# Patient Record
Sex: Female | Born: 1955 | Race: White | Hispanic: No | Marital: Married | Smoking: Never smoker
Health system: Southern US, Community
[De-identification: ages and names within clinical notes are randomized; demographics above are authoritative.]

## PROBLEM LIST (undated history)

## (undated) DIAGNOSIS — F419 Anxiety disorder, unspecified: Secondary | ICD-10-CM

## (undated) DIAGNOSIS — I10 Essential (primary) hypertension: Secondary | ICD-10-CM

## (undated) DIAGNOSIS — E119 Type 2 diabetes mellitus without complications: Secondary | ICD-10-CM

## (undated) HISTORY — DX: Type 2 diabetes mellitus without complications: E11.9

## (undated) HISTORY — PX: CHOLECYSTECTOMY: SHX55

## (undated) HISTORY — DX: Anxiety disorder, unspecified: F41.9

## (undated) HISTORY — DX: Essential (primary) hypertension: I10

---

## 2004-09-18 ENCOUNTER — Ambulatory Visit: Payer: Self-pay | Admitting: Internal Medicine

## 2004-09-19 ENCOUNTER — Ambulatory Visit: Payer: Self-pay | Admitting: General Surgery

## 2004-09-22 ENCOUNTER — Observation Stay: Payer: Self-pay | Admitting: General Surgery

## 2004-09-22 ENCOUNTER — Other Ambulatory Visit: Payer: Self-pay

## 2011-04-19 ENCOUNTER — Ambulatory Visit: Payer: Self-pay | Admitting: Internal Medicine

## 2013-12-07 LAB — BASIC METABOLIC PANEL
BUN: 10 mg/dL (ref 4–21)
Creatinine: 0.6 mg/dL (ref 0.5–1.1)
GLUCOSE: 243 mg/dL
Potassium: 4.4 mmol/L (ref 3.4–5.3)
Sodium: 138 mmol/L (ref 137–147)

## 2013-12-07 LAB — LIPID PANEL
Cholesterol: 193 mg/dL (ref 0–200)
HDL: 37 mg/dL (ref 35–70)
LDL Cholesterol: 122 mg/dL
LDl/HDL Ratio: 3.3
Triglycerides: 169 mg/dL — AB (ref 40–160)

## 2013-12-07 LAB — HEPATIC FUNCTION PANEL
ALT: 61 U/L — AB (ref 7–35)
AST: 91 U/L — AB (ref 13–35)
Alkaline Phosphatase: 91 U/L (ref 25–125)
Bilirubin, Total: 0.5 mg/dL

## 2013-12-07 LAB — CBC AND DIFFERENTIAL
HCT: 42 % (ref 36–46)
Hemoglobin: 14.1 g/dL (ref 12.0–16.0)
Neutrophils Absolute: 6 /uL
Platelets: 217 10*3/uL (ref 150–399)
WBC: 9.8 10^3/mL

## 2013-12-07 LAB — TSH: TSH: 3.97 u[IU]/mL (ref 0.41–5.90)

## 2014-03-12 ENCOUNTER — Ambulatory Visit: Payer: Self-pay | Admitting: Family Medicine

## 2014-06-25 LAB — HEMOGLOBIN A1C: Hemoglobin A1C: 5.9

## 2014-11-05 ENCOUNTER — Other Ambulatory Visit: Payer: Self-pay | Admitting: Family Medicine

## 2014-11-05 DIAGNOSIS — F419 Anxiety disorder, unspecified: Secondary | ICD-10-CM

## 2014-11-08 DIAGNOSIS — F411 Generalized anxiety disorder: Secondary | ICD-10-CM | POA: Insufficient documentation

## 2014-11-08 DIAGNOSIS — F419 Anxiety disorder, unspecified: Secondary | ICD-10-CM | POA: Insufficient documentation

## 2014-12-26 ENCOUNTER — Other Ambulatory Visit: Payer: Self-pay | Admitting: Family Medicine

## 2014-12-28 ENCOUNTER — Other Ambulatory Visit: Payer: Self-pay | Admitting: Family Medicine

## 2014-12-29 NOTE — Telephone Encounter (Signed)
Last ov was on 06/25/2014  Last time the xanax prescription was refilled was on 09/04/2014  Thanks,

## 2014-12-29 NOTE — Telephone Encounter (Signed)
Ok to refill rx. Thanks.

## 2015-01-19 ENCOUNTER — Encounter: Payer: Self-pay | Admitting: Family Medicine

## 2015-04-26 ENCOUNTER — Other Ambulatory Visit: Payer: Self-pay | Admitting: Family Medicine

## 2015-04-26 DIAGNOSIS — I152 Hypertension secondary to endocrine disorders: Secondary | ICD-10-CM | POA: Insufficient documentation

## 2015-04-26 DIAGNOSIS — I10 Essential (primary) hypertension: Secondary | ICD-10-CM

## 2015-04-26 NOTE — Telephone Encounter (Signed)
Last OV 06/2014  Thanks,   -Davita Sublett  

## 2015-06-30 ENCOUNTER — Other Ambulatory Visit: Payer: Self-pay | Admitting: Family Medicine

## 2015-06-30 DIAGNOSIS — I10 Essential (primary) hypertension: Secondary | ICD-10-CM

## 2015-06-30 NOTE — Telephone Encounter (Signed)
Last OV 06/2014 

## 2015-07-23 ENCOUNTER — Other Ambulatory Visit: Payer: Self-pay | Admitting: Family Medicine

## 2015-07-25 NOTE — Telephone Encounter (Signed)
06/25/14 last ov

## 2015-09-27 ENCOUNTER — Other Ambulatory Visit: Payer: Self-pay | Admitting: Family Medicine

## 2015-09-27 DIAGNOSIS — I10 Essential (primary) hypertension: Secondary | ICD-10-CM

## 2015-10-05 DIAGNOSIS — R748 Abnormal levels of other serum enzymes: Secondary | ICD-10-CM | POA: Insufficient documentation

## 2015-10-05 DIAGNOSIS — IMO0002 Reserved for concepts with insufficient information to code with codable children: Secondary | ICD-10-CM | POA: Insufficient documentation

## 2015-10-05 DIAGNOSIS — E119 Type 2 diabetes mellitus without complications: Secondary | ICD-10-CM | POA: Insufficient documentation

## 2015-10-05 DIAGNOSIS — F329 Major depressive disorder, single episode, unspecified: Secondary | ICD-10-CM | POA: Insufficient documentation

## 2015-10-05 DIAGNOSIS — E1165 Type 2 diabetes mellitus with hyperglycemia: Secondary | ICD-10-CM | POA: Insufficient documentation

## 2015-11-04 ENCOUNTER — Encounter: Payer: Self-pay | Admitting: Physician Assistant

## 2015-11-04 ENCOUNTER — Ambulatory Visit (INDEPENDENT_AMBULATORY_CARE_PROVIDER_SITE_OTHER): Payer: Managed Care, Other (non HMO) | Admitting: Physician Assistant

## 2015-11-04 VITALS — BP 160/102 | HR 86 | Temp 98.3°F | Resp 16 | Ht 60.0 in | Wt 219.4 lb

## 2015-11-04 DIAGNOSIS — IMO0002 Reserved for concepts with insufficient information to code with codable children: Secondary | ICD-10-CM

## 2015-11-04 DIAGNOSIS — F32A Depression, unspecified: Secondary | ICD-10-CM

## 2015-11-04 DIAGNOSIS — R748 Abnormal levels of other serum enzymes: Secondary | ICD-10-CM

## 2015-11-04 DIAGNOSIS — F329 Major depressive disorder, single episode, unspecified: Secondary | ICD-10-CM

## 2015-11-04 DIAGNOSIS — F419 Anxiety disorder, unspecified: Secondary | ICD-10-CM | POA: Diagnosis not present

## 2015-11-04 DIAGNOSIS — E668 Other obesity: Secondary | ICD-10-CM

## 2015-11-04 DIAGNOSIS — I10 Essential (primary) hypertension: Secondary | ICD-10-CM | POA: Diagnosis not present

## 2015-11-04 DIAGNOSIS — E119 Type 2 diabetes mellitus without complications: Secondary | ICD-10-CM

## 2015-11-04 DIAGNOSIS — E782 Mixed hyperlipidemia: Secondary | ICD-10-CM

## 2015-11-04 MED ORDER — ALPRAZOLAM 0.5 MG PO TABS: ORAL_TABLET | ORAL | Status: DC

## 2015-11-04 MED ORDER — HYDROCHLOROTHIAZIDE 12.5 MG PO CAPS: 12.5000 mg | ORAL_CAPSULE | Freq: Every day | ORAL | Status: DC

## 2015-11-04 NOTE — Progress Notes (Signed)
Patient: Dana ArrowCarol E Tantillo, Female    DOB: 05/22/1955, 60 y.o.   MRN: [id number] Visit Date: 11/04/2015  Today's Provider: Margaretann LovelessJennifer M Burnette, PA-C   Chief Complaint  Patient presents with  . Annual Exam   Subjective:    Annual physical exam Dana Wallace is a 60 y.o. female who presents today for follow up on chronic issues she has not been seen for in over a year including: HTN, T2DM, depression and anxiety. She feels fairly well. She reports exercising none. She reports she is sleeping poorly. 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 She had focused on taking care of the dog for a year following her diagnosis until the dog deteriorated enough to have to be put down. She reports she used to get up all hours of the night for lil' muffin and is still finsing herself waking up to take care of her. They had the dog for 14 years. She does state that after the diagnosis of cancer for lil' muffin she really quit taking care of herself and focused on work and taking care of the dog. She felt she had been doing ok before that time. She had even lost about 20 pounds before all this started.   She has been taking her metoprolol 25mg  BID. She has not been taking metformin and has not taken that for a while. She is taking her xanax prn, mostly to help her sleep of recent. She is out of the xanax currently.   Patient is teary in office she reports that has a lot of stress and feeling anxious/nervous. She feels upset about her weight gain.  Mammogram: Pt reports that she had it done Feb/March of this year and was normal. She had it [facility] at [facility]. -----------------------------------------------------------------   Review of Systems  Constitutional: Positive for fatigue.  HENT: Negative.   Eyes: Negative.   Respiratory: Negative.   Cardiovascular: Negative.     Gastrointestinal: Negative.   Endocrine: Negative.   Genitourinary: Negative.   Musculoskeletal: Negative.   Skin: Negative.   Allergic/Immunologic: Negative.   Neurological: Negative.   Hematological: Negative.   Psychiatric/Behavioral: Positive for sleep disturbance and dysphoric mood. Negative for suicidal ideas, self-injury, decreased concentration and agitation. The patient is nervous/anxious.     Social History      She  reports that she has never smoked. She does not have any smokeless tobacco history on file. She reports that she does not drink alcohol or use illicit drugs.       Social History   Social History  . Marital Status: Married    Spouse Name: N/A  . Number of Children: N/A  . Years of Education: N/A   Social History Main Topics  . Smoking status: Never Smoker   . Smokeless tobacco: None  . Alcohol Use: No  . Drug Use: No  . Sexual Activity: Not Asked   Other Topics Concern  . None   Social History Narrative    History reviewed. No pertinent past medical history.   Patient Active Problem List   Diagnosis Date Noted  . Adult BMI 30+ 10/05/2015  . Clinical depression 10/05/2015  . Abnormal liver enzymes 10/05/2015  . Diabetes mellitus, type 2 (HCC) 10/05/2015  . Essential (primary) hypertension 04/26/2015  . Anxiety 11/08/2014    Past Surgical History  Procedure Laterality Date  . Cholecystectomy    . Cesarean section      Family History        Family Status  Relation Status Death Age  . Mother Deceased   . Sister Deceased   . Brother Alive         Her family history includes COPD in her mother; Hodgkin's lymphoma in her sister; Multiple sclerosis in her mother.    No Known Allergies  Current Meds  Medication Sig  . ALPRAZolam (XANAX) 0.5 MG tablet TAKE ONE-HALF TO ONE TABLET BY MOUTH TWICE DAILY AS NEEDED  . aspirin 81 MG tablet Take by mouth.  Marland Kitchen ibuprofen (ADVIL,MOTRIN) 200 MG tablet   . metoprolol tartrate (LOPRESSOR) 25 MG  tablet TAKE 1 TABLET(25 MG) BY MOUTH TWICE DAILY  . Multiple Vitamin tablet   . Omega-3 Fatty Acids (FISH OIL) 1200 MG CPDR Take by mouth daily.    Patient Care Team: Margaretann Loveless, PA-C as PCP - General (Family Medicine)     Objective:   Vitals: BP 160/102 mmHg  Pulse 86  Temp(Src) 98.3 F (36.8 C) (Oral)  Resp 16  Ht 5' (1.524 m)  Wt 219 lb 6.4 oz (99.519 kg)  BMI 42.85 kg/m2  LMP    Physical Exam  Constitutional: She is oriented to person, place, and time. She appears well-developed and well-nourished. No distress.  HENT:  Head: Normocephalic and atraumatic.  Right Ear: External ear normal.  Left Ear: External ear normal.  Nose: Nose normal.  Mouth/Throat: Oropharynx is clear and moist. No oropharyngeal exudate.  Eyes: Conjunctivae and EOM are normal. Pupils are equal, round, and reactive to light. Right eye exhibits no discharge. Left eye exhibits no discharge. No scleral icterus.  Neck: Normal range of motion. Neck supple. No JVD present. No tracheal deviation present. No thyromegaly present.  Cardiovascular: Normal rate, regular rhythm, normal heart sounds and intact distal pulses.  Exam reveals no gallop and no friction rub.   No murmur heard. Pulmonary/Chest: Effort normal and breath sounds normal. No respiratory distress. She has no wheezes. She has no rales. She exhibits no tenderness.  Abdominal: Soft. Bowel sounds are normal. She exhibits no distension and no mass. There is no tenderness. There is no rebound and no guarding.  Musculoskeletal: Normal range of motion. She exhibits no edema or tenderness.  Lymphadenopathy:    She has no cervical adenopathy.  Neurological: She is alert and oriented to person, place, and time.  Skin: Skin is warm and dry. No rash noted. She is not diaphoretic.  Psychiatric: Her behavior is normal. Judgment and thought content normal. She exhibits a depressed mood.  Tearful today  Vitals reviewed.    Depression Screen PHQ  2/9 Scores 11/04/2015  PHQ - 2 Score 1      Assessment & Plan:    1. Essential (primary) hypertension Will check labs as below and add HCTZ back to metoprolol for BP. I will see her back in 4 weeks to recehck BP.  - CBC with Differential/Platelet - Comprehensive metabolic panel - Lipid panel - TSH - hydrochlorothiazide (MICROZIDE) 12.5 MG capsule; Take 1 capsule (12.5 mg total) by mouth daily.  Dispense: 30 capsule; Refill: 0  2. Type 2 diabetes mellitus without complication, without long-term current use of insulin (HCC) Will check labs and f/u pending results. May consider adding metformin back if needed.  - Hemoglobin A1c - Lipid panel  3. Adult BMI 30+ Will check labs and f/u pending results. If  BP stabilizing in 4 weeks may consider discussing weight loss. - Lipid panel  4. Abnormal liver enzymes H/O this. Most likely secondary to fatty liver. Will check labs and f/u pending lab results. - Comprehensive metabolic panel  5. Clinical depression Xanax refilled as below. Will check labs and f/u pending results. Will recheck in 4 weeks. - TSH - ALPRAZolam (XANAX) 0.5 MG tablet; TAKE ONE-HALF TO ONE TABLET BY MOUTH TWICE DAILY AS NEEDED  Dispense: 45 tablet; Refill: 0  6. Acute anxiety Xanax refilled. Will recheck in 4 weeks. - ALPRAZolam (XANAX) 0.5 MG tablet; TAKE ONE-HALF TO ONE TABLET BY MOUTH TWICE DAILY AS NEEDED  Dispense: 45 tablet; Refill: 0  --------------------------------------------------------------------    Margaretann Loveless, PA-C  [facility]

## 2015-11-04 NOTE — Patient Instructions (Signed)
Hypertension Hypertension, commonly called high blood pressure, is when the force of blood pumping through your arteries is too strong. Your arteries are the blood vessels that carry blood from your heart throughout your body. A blood pressure reading consists of a higher number over a lower number, such as 110/72. The higher number (systolic) is the pressure inside your arteries when your heart pumps. The lower number (diastolic) is the pressure inside your arteries when your heart relaxes. Ideally you want your blood pressure below 120/80. Hypertension forces your heart to work harder to pump blood. Your arteries may become narrow or stiff. Having untreated or uncontrolled hypertension can cause heart attack, stroke, kidney disease, and other problems. RISK FACTORS Some risk factors for high blood pressure are controllable. Others are not.  Risk factors you cannot control include:   Race. You may be at higher risk if you are African American.  Age. Risk increases with age.  Gender. Men are at higher risk than women before age 45 years. After age 65, women are at higher risk than men. Risk factors you can control include:  Not getting enough exercise or physical activity.  Being overweight.  Getting too much fat, sugar, calories, or salt in your diet.  Drinking too much alcohol. SIGNS AND SYMPTOMS Hypertension does not usually cause signs or symptoms. Extremely high blood pressure (hypertensive crisis) may cause headache, anxiety, shortness of breath, and nosebleed. DIAGNOSIS To check if you have hypertension, your health care provider will measure your blood pressure while you are seated, with your arm held at the level of your heart. It should be measured at least twice using the same arm. Certain conditions can cause a difference in blood pressure between your right and left arms. A blood pressure reading that is higher than normal on one occasion does not mean that you need treatment. If  it is not clear whether you have high blood pressure, you may be asked to return on a different day to have your blood pressure checked again. Or, you may be asked to monitor your blood pressure at home for 1 or more weeks. TREATMENT Treating high blood pressure includes making lifestyle changes and possibly taking medicine. Living a healthy lifestyle can help lower high blood pressure. You may need to change some of your habits. Lifestyle changes may include:  Following the DASH diet. This diet is high in fruits, vegetables, and whole grains. It is low in salt, red meat, and added sugars.  Keep your sodium intake below 2,300 mg per day.  Getting at least 30-45 minutes of aerobic exercise at least 4 times per week.  Losing weight if necessary.  Not smoking.  Limiting alcoholic beverages.  Learning ways to reduce stress. Your health care provider may prescribe medicine if lifestyle changes are not enough to get your blood pressure under control, and if one of the following is true:  You are 18-59 years of age and your systolic blood pressure is above 140.  You are 60 years of age or older, and your systolic blood pressure is above 150.  Your diastolic blood pressure is above 90.  You have diabetes, and your systolic blood pressure is over 140 or your diastolic blood pressure is over 90.  You have kidney disease and your blood pressure is above 140/90.  You have heart disease and your blood pressure is above 140/90. Your personal target blood pressure may vary depending on your medical conditions, your age, and other factors. HOME CARE INSTRUCTIONS    Have your blood pressure rechecked as directed by your health care provider.   Take medicines only as directed by your health care provider. Follow the directions carefully. Blood pressure medicines must be taken as prescribed. The medicine does not work as well when you skip doses. Skipping doses also puts you at risk for  problems.  Do not smoke.   Monitor your blood pressure at home as directed by your health care provider. SEEK MEDICAL CARE IF:   You think you are having a reaction to medicines taken.  You have recurrent headaches or feel dizzy.  You have swelling in your ankles.  You have trouble with your vision. SEEK IMMEDIATE MEDICAL CARE IF:  You develop a severe headache or confusion.  You have unusual weakness, numbness, or feel faint.  You have severe chest or abdominal pain.  You vomit repeatedly.  You have trouble breathing. MAKE SURE YOU:   Understand these instructions.  Will watch your condition.  Will get help right away if you are not doing well or get worse.   This information is not intended to replace advice given to you by your health care provider. Make sure you discuss any questions you have with your health care provider.   Document Released: 05/07/2005 Document Revised: 09/21/2014 Document Reviewed: 02/27/2013 Elsevier Interactive Patient Education 2016 [employer]. Complicated Grieving Grief is a normal response to the death of someone close to you. Feelings of fear, anger, and guilt can affect almost everyone who loses a loved one. It is also common to have symptoms of depression while you are grieving. These include problems with sleep, loss of appetite, and lack of energy. They may last for weeks or months after a loss. Complicated grief is different from normal grief or depression. Normal grieving involves sadness and feelings of loss, but these feelings are not constant. Complicated grief is a constant and severe type of grief. It interferes with your ability to function normally. It may last for several months to a year or longer. Complicated grief may require treatment from a mental health care provider. CAUSES  It is not known why some people continue to struggle with grief and others do not. You may be at higher risk for complicated grief if:  The death  of your loved one was sudden or unexpected.  The death of your loved one was due to a violent event.  Your loved one committed suicide.  Your loved one was a child or a young person.  You were very close to or dependent on the loved one.  You have a history of depression. SIGNS AND SYMPTOMS Signs and symptoms of complicated grief may include:  Feeling disbelief or numbness.  Being unable to enjoy good memories of your loved one.  Needing to avoid anything that reminds you of your loved one.  Being unable to stop thinking about the death.  Feeling intense anger or guilt.  Feeling alone and hopeless.  Feeling that your life is meaningless and empty.  Losing the desire to live. DIAGNOSIS Your health care provider may diagnose complicated grief if:  You have constant symptoms of grief for 6-12 months or longer.  Your symptoms are interfering with your ability to live your life. Your health care provider may want you to see a mental health care provider. Many symptoms of depression are similar to the symptoms of complicated grief. It is important to be evaluated for complicated grief along with other mental health conditions. TREATMENT  Talk therapy  with a mental health provider is the most common treatment for complicated grief. During therapy, you will learn healthy ways to cope with the loss of your loved one. In some cases, your mental health care provider may also recommend antidepressant medicines. HOME CARE INSTRUCTIONS  Take care of yourself.  Eat regular meals and maintain a healthy diet. Eat plenty of fruits, vegetables, and whole grains.  Try to get some exercise each day.  Keep regular hours for sleep. Try to get at least 8 hours of sleep each night.  Do not use drugs or alcohol to ease your symptoms.  Take medicines only as directed by your health care provider.  Spend time with friends and loved ones.  Consider joining a grief (bereavement) support group  to help you deal with your loss.  Keep all follow-up visits as directed by your health care provider. This is important. SEEK MEDICAL CARE IF:  Your symptoms keep you from functioning normally.  Your symptoms do not get better with treatment. SEEK IMMEDIATE MEDICAL CARE IF:  You have serious thoughts of hurting yourself or someone else.  You have suicidal feelings.   This information is not intended to replace advice given to you by your health care provider. Make sure you discuss any questions you have with your health care provider.   Document Released: 05/07/2005 Document Revised: 01/26/2015 Document Reviewed: 10/15/2013 Elsevier Interactive Patient Education [employer] Elsevier Inc.

## 2015-11-05 LAB — COMPREHENSIVE METABOLIC PANEL
ALK PHOS: 73 IU/L (ref 39–117)
ALT: 30 IU/L (ref 0–32)
AST: 27 IU/L (ref 0–40)
Albumin/Globulin Ratio: 1.4 (ref 1.2–2.2)
Albumin: 4.4 g/dL (ref 3.5–5.5)
BILIRUBIN TOTAL: 0.4 mg/dL (ref 0.0–1.2)
BUN / CREAT RATIO: 21 (ref 9–23)
BUN: 12 mg/dL (ref 6–24)
CO2: 28 mmol/L (ref 18–29)
CREATININE: 0.58 mg/dL (ref 0.57–1.00)
Calcium: 9.9 mg/dL (ref 8.7–10.2)
Chloride: 97 mmol/L (ref 96–106)
GFR calc Af Amer: 118 mL/min/{1.73_m2} (ref >59)
GFR calc non Af Amer: 103 mL/min/{1.73_m2} (ref >59)
GLUCOSE: 179 mg/dL — AB (ref 65–99)
Globulin, Total: 3.2 g/dL (ref 1.5–4.5)
POTASSIUM: 4.2 mmol/L (ref 3.5–5.2)
SODIUM: 141 mmol/L (ref 134–144)
Total Protein: 7.6 g/dL (ref 6.0–8.5)

## 2015-11-05 LAB — CBC WITH DIFFERENTIAL/PLATELET
BASOS ABS: 0 10*3/uL (ref 0.0–0.2)
Basos: 0 %
EOS (ABSOLUTE): 0.1 10*3/uL (ref 0.0–0.4)
Eos: 1 %
HEMOGLOBIN: 14.2 g/dL (ref 11.1–15.9)
Hematocrit: 40.9 % (ref 34.0–46.6)
IMMATURE GRANS (ABS): 0 10*3/uL (ref 0.0–0.1)
Immature Granulocytes: 0 %
LYMPHS: 25 %
Lymphocytes Absolute: 2.5 10*3/uL (ref 0.7–3.1)
MCH: 31.4 pg (ref 26.6–33.0)
MCHC: 34.7 g/dL (ref 31.5–35.7)
MCV: 91 fL (ref 79–97)
MONOCYTES: 6 %
Monocytes Absolute: 0.6 10*3/uL (ref 0.1–0.9)
NEUTROS ABS: 6.5 10*3/uL (ref 1.4–7.0)
Neutrophils: 68 %
PLATELETS: 262 10*3/uL (ref 150–379)
RBC: 4.52 x10E6/uL (ref 3.77–5.28)
RDW: 13.9 % (ref 12.3–15.4)
WBC: 9.8 10*3/uL (ref 3.4–10.8)

## 2015-11-05 LAB — LIPID PANEL
CHOL/HDL RATIO: 4.5 ratio — AB (ref 0.0–4.4)
Cholesterol, Total: 216 mg/dL — ABNORMAL HIGH (ref 100–199)
HDL: 48 mg/dL (ref >39)
LDL CALC: 134 mg/dL — AB (ref 0–99)
Triglycerides: 171 mg/dL — ABNORMAL HIGH (ref 0–149)
VLDL CHOLESTEROL CAL: 34 mg/dL (ref 5–40)

## 2015-11-05 LAB — TSH: TSH: 2.66 u[IU]/mL (ref 0.450–4.500)

## 2015-11-05 LAB — HEMOGLOBIN A1C
Est. average glucose Bld gHb Est-mCnc: 183 mg/dL
Hgb A1c MFr Bld: 8 % — ABNORMAL HIGH (ref 4.8–5.6)

## 2015-11-07 ENCOUNTER — Telehealth: Payer: Self-pay

## 2015-11-07 DIAGNOSIS — E782 Mixed hyperlipidemia: Secondary | ICD-10-CM | POA: Insufficient documentation

## 2015-11-07 MED ORDER — METFORMIN HCL 500 MG PO TABS: 500.0000 mg | ORAL_TABLET | Freq: Two times a day (BID) | ORAL | Status: DC

## 2015-11-07 MED ORDER — SIMVASTATIN 10 MG PO TABS: 10.0000 mg | ORAL_TABLET | Freq: Every day | ORAL | Status: DC

## 2015-11-07 NOTE — Addendum Note (Signed)
Addended by: Margaretann LovelessBURNETTE, JENNIFER M on: 11/07/2015 08:40 AM   Modules accepted: Orders, SmartSet

## 2015-11-07 NOTE — Telephone Encounter (Signed)
-----   Message from Margaretann LovelessJennifer M Burnette, PA-C sent at 11/07/2015  8:38 AM EDT ----- All labs are within normal limits and stable with exception of cholesterol and HgBA1c. HgBA1c is elevated to 8.0. We need to start metformin back. I will send this in for you. Also because of your T2DM and HTN with elevated cholesterol your 10-yr atherosclerotic cardiovascular disease (heart attack, stroke, etc) is quite elevated at 15.04% chance of an event in the next 10 years. Because of this I am going to add a cholesterol-lowering medication as well. We will need to check HgBA1c in 3 months. This can be done here in the office. We will then check other labs in 6 months. Thanks! -JB

## 2015-11-07 NOTE — Telephone Encounter (Signed)
Call Cell phone number and disconnected and call job and person that answered say to call her at her cell phone.  Thanks,  -Thurma Priego

## 2015-11-08 NOTE — Telephone Encounter (Signed)
LMTCB Re: Benna DunksCarol Siglin since her cell/home number is disconnected.  Thanks,  -Annalee Meyerhoff

## 2015-11-15 NOTE — Telephone Encounter (Signed)
Called again and number is disconnected. On previous visit called the emergency contact and asked if Patient can return call and have not heard from patient.  Thanks,  -Beronica Lansdale

## 2015-11-19 NOTE — Telephone Encounter (Signed)
Letter was sent home. 11/19/15  Thanks,  -Joseline

## 2015-11-28 ENCOUNTER — Other Ambulatory Visit: Payer: Self-pay | Admitting: Physician Assistant

## 2015-12-05 ENCOUNTER — Encounter: Payer: Self-pay | Admitting: Physician Assistant

## 2015-12-05 ENCOUNTER — Ambulatory Visit (INDEPENDENT_AMBULATORY_CARE_PROVIDER_SITE_OTHER): Payer: Managed Care, Other (non HMO) | Admitting: Physician Assistant

## 2015-12-05 VITALS — BP 132/80 | HR 96 | Temp 98.4°F | Resp 16 | Wt 217.4 lb

## 2015-12-05 DIAGNOSIS — E119 Type 2 diabetes mellitus without complications: Secondary | ICD-10-CM

## 2015-12-05 DIAGNOSIS — E782 Mixed hyperlipidemia: Secondary | ICD-10-CM

## 2015-12-05 DIAGNOSIS — F32A Depression, unspecified: Secondary | ICD-10-CM

## 2015-12-05 DIAGNOSIS — F329 Major depressive disorder, single episode, unspecified: Secondary | ICD-10-CM | POA: Diagnosis not present

## 2015-12-05 DIAGNOSIS — I10 Essential (primary) hypertension: Secondary | ICD-10-CM

## 2015-12-05 NOTE — Patient Instructions (Signed)

## 2015-12-05 NOTE — Progress Notes (Signed)
Patient: Dana Wallace Female    DOB: 1955-09-28   60 y.o.   MRN: [id number] Visit Date: 12/05/2015  Today's Provider: Margaretann Loveless, PA-C   No chief complaint on file.  Subjective:    HPI Blood Pressure: Patient is here to follow-up. Patient's blood pressure in the last office visit was 160/102 and today her blood pressure is 132/80.   Clinical Depression: Patient is here to follow-up.Xanax was refilled 4 weeks ago. She reports she still trying to cope, some days she feels good and some days she feels sad.  Labs: We will follow-up on labs since we were not able to reach patient by phone. Patient's Hemoglobin A1C 8.0. Patient reports that she saw the reports on mychart and the pharmacy notified her about the Metformin and Zocor and that she has been taking them.    No Known Allergies Current Meds  Medication Sig  . ALPRAZolam (XANAX) 0.5 MG tablet TAKE ONE-HALF TO ONE TABLET BY MOUTH TWICE DAILY AS NEEDED  . aspirin 81 MG tablet Take by mouth.  . hydrochlorothiazide (MICROZIDE) 12.5 MG capsule TAKE ONE CAPSULE BY MOUTH DAILY  . ibuprofen (ADVIL,MOTRIN) 200 MG tablet   . metFORMIN (GLUCOPHAGE) 500 MG tablet Take 1 tablet (500 mg total) by mouth 2 (two) times daily with a meal.  . metoprolol tartrate (LOPRESSOR) 25 MG tablet TAKE 1 TABLET(25 MG) BY MOUTH TWICE DAILY  . Multiple Vitamin tablet   . Omega-3 Fatty Acids (FISH OIL) 1200 MG CPDR Take by mouth daily.  . simvastatin (ZOCOR) 10 MG tablet Take 1 tablet (10 mg total) by mouth at bedtime.    Review of Systems  Constitutional: Negative for fatigue.  Eyes: Negative for visual disturbance.  Respiratory: Negative.   Cardiovascular: Negative for chest pain, palpitations and leg swelling.  Gastrointestinal: Negative.   Genitourinary: Positive for frequency (feels is from the hydrochlorothiazide).  Neurological: Negative for dizziness, light-headedness and headaches.  Psychiatric/Behavioral: Positive for dysphoric  mood. Negative for sleep disturbance. The patient is not nervous/anxious.     Social History  Substance Use Topics  . Smoking status: Never Smoker   . Smokeless tobacco: Not on file  . Alcohol Use: No   Objective:   BP 132/80 mmHg  Pulse 96  Temp(Src) 98.4 F (36.9 C) (Oral)  Resp 16  Wt 217 lb 6.4 oz (98.612 kg)  Physical Exam  Constitutional: She appears well-developed and well-nourished. No distress.  Neck: Normal range of motion. Neck supple.  Cardiovascular: Normal rate, regular rhythm and normal heart sounds.  Exam reveals no gallop and no friction rub.   No murmur heard. Pulmonary/Chest: Effort normal and breath sounds normal. No respiratory distress. She has no wheezes. She has no rales.  Musculoskeletal: She exhibits no edema.  Skin: She is not diaphoretic.  Psychiatric: Her behavior is normal. Judgment and thought content normal. She exhibits a depressed mood.  Still a little tearful when we discuss lil' muffin (her dog of 28yrs that passed 5 weeks ago)  Vitals reviewed.     Assessment & Plan:     1. Essential (primary) hypertension Improved. Continue current medical treatment plan. I will f/u in 2 months to recheck and make sure stable.  2. Type 2 diabetes mellitus without complication, without long-term current use of insulin (HCC) Currently taking metformin. Will recheck HgBA1c in 2 months.  3. Clinical depression She is doing ok. Has good days and bad days. Will recheck in 2 months.  4.  Hypercholesterolemia with hypertriglyceridemia Now on Zocor. Will recheck in 6 months.       Margaretann LovelessJennifer M Burnette, PA-C  [facility]

## 2015-12-25 ENCOUNTER — Other Ambulatory Visit: Payer: Self-pay | Admitting: Physician Assistant

## 2015-12-25 DIAGNOSIS — F419 Anxiety disorder, unspecified: Secondary | ICD-10-CM

## 2015-12-25 DIAGNOSIS — F329 Major depressive disorder, single episode, unspecified: Secondary | ICD-10-CM

## 2015-12-25 DIAGNOSIS — F32A Depression, unspecified: Secondary | ICD-10-CM

## 2015-12-26 NOTE — Telephone Encounter (Signed)
Called in to pharmacy. Althia Egolf Drozdowski, CMA  

## 2016-01-27 ENCOUNTER — Other Ambulatory Visit: Payer: Self-pay | Admitting: Physician Assistant

## 2016-01-27 DIAGNOSIS — E119 Type 2 diabetes mellitus without complications: Secondary | ICD-10-CM

## 2016-01-27 MED ORDER — GLUCOSE BLOOD VI STRP: ORAL_STRIP | 12 refills | Status: DC

## 2016-02-06 ENCOUNTER — Encounter: Payer: Self-pay | Admitting: Physician Assistant

## 2016-02-06 ENCOUNTER — Ambulatory Visit (INDEPENDENT_AMBULATORY_CARE_PROVIDER_SITE_OTHER): Payer: Managed Care, Other (non HMO) | Admitting: Physician Assistant

## 2016-02-06 VITALS — BP 112/72 | HR 84 | Temp 99.1°F | Resp 16 | Ht 60.0 in | Wt 218.0 lb

## 2016-02-06 DIAGNOSIS — Z713 Dietary counseling and surveillance: Secondary | ICD-10-CM

## 2016-02-06 DIAGNOSIS — Z6841 Body Mass Index (BMI) 40.0 and over, adult: Secondary | ICD-10-CM | POA: Diagnosis not present

## 2016-02-06 DIAGNOSIS — E782 Mixed hyperlipidemia: Secondary | ICD-10-CM | POA: Diagnosis not present

## 2016-02-06 DIAGNOSIS — I10 Essential (primary) hypertension: Secondary | ICD-10-CM | POA: Diagnosis not present

## 2016-02-06 DIAGNOSIS — E119 Type 2 diabetes mellitus without complications: Secondary | ICD-10-CM

## 2016-02-06 LAB — POCT GLYCOSYLATED HEMOGLOBIN (HGB A1C)
ESTIMATED AVERAGE GLUCOSE: 189
HEMOGLOBIN A1C: 8.2

## 2016-02-06 MED ORDER — PHENTERMINE HCL 37.5 MG PO TABS: 37.5000 mg | ORAL_TABLET | Freq: Every day | ORAL | 0 refills | Status: DC

## 2016-02-06 NOTE — Patient Instructions (Signed)

## 2016-02-06 NOTE — Progress Notes (Deleted)
   Diabetes Mellitus Type II, Follow-up:   Lab Results  Component Value Date   HGBA1C 8.0 (H) 11/04/2015   HGBA1C 5.9 06/25/2014    Last seen for diabetes 2 months ago.  Management since then includes no changes. She reports good compliance with treatment. She is having side effects. Patient reports that she has had some dizziness that lasted 3 days, but went away.  Current symptoms include {Symptoms; diabetes:14075} and have been {Desc; course:15616}. Home blood sugar records: {diabetes glucometry results:16657}  Episodes of hypoglycemia? no   Current Insulin Regimen: *** Most Recent Eye Exam: *** Weight trend: {trend:16658} Prior visit with dietician: {yes/no:17258} Current diet: {diet habits:16563} Current exercise: {exercise types:16438}  Pertinent Labs:    Component Value Date/Time   CHOL 216 (H) 11/04/2015 1503   TRIG 171 (H) 11/04/2015 1503   HDL 48 11/04/2015 1503   LDLCALC 134 (H) 11/04/2015 1503   CREATININE 0.58 11/04/2015 1503    Wt Readings from Last 3 Encounters:  02/06/16 218 lb (98.9 kg)  12/05/15 217 lb 6.4 oz (98.6 kg)  11/04/15 219 lb 6.4 oz (99.5 kg)    ------------------------------------------------------------------------

## 2016-02-06 NOTE — Progress Notes (Signed)
Patient: Dana Wallace Female    DOB: 05-04-1956   60 y.o.   MRN: [id number] Visit Date: 02/06/2016  Today's Provider: Margaretann Loveless, PA-C   Chief Complaint  Patient presents with  . Diabetes   Subjective:    HPI  Diabetes Mellitus Type II, Follow-up:   Lab Results  Component Value Date   HGBA1C 8.2 02/06/2016   HGBA1C 8.0 (H) 11/04/2015   HGBA1C 5.9 06/25/2014    Last seen for diabetes 2 months ago.  Management since then includes no changes. She reports good compliance with treatment. She is not having side effects.   Episodes of hypoglycemia? no    Weight trend: stable Prior visit with dietician: no Current diet: well balanced Current exercise: walking  Pertinent Labs:    Component Value Date/Time   CHOL 216 (H) 11/04/2015 1503   TRIG 171 (H) 11/04/2015 1503   HDL 48 11/04/2015 1503   LDLCALC 134 (H) 11/04/2015 1503   CREATININE 0.58 11/04/2015 1503    Wt Readings from Last 3 Encounters:  02/06/16 218 lb (98.9 kg)  12/05/15 217 lb 6.4 oz (98.6 kg)  11/04/15 219 lb 6.4 oz (99.5 kg)      Hypertension, follow-up:  BP Readings from Last 3 Encounters:  02/06/16 112/72  12/05/15 132/80  11/04/15 (!) 160/102    She was last seen for hypertension 2 months ago.  BP at that visit was 132/80. Management since that visit includes no changes. She reports good compliance with treatment. She is not having side effects.  She is exercising. She is adherent to low salt diet.   Outside blood pressures are checked occasionally.    Weight trend: stable Wt Readings from Last 3 Encounters:  02/06/16 218 lb (98.9 kg)  12/05/15 217 lb 6.4 oz (98.6 kg)  11/04/15 219 lb 6.4 oz (99.5 kg)    Current diet: well balanced     No Known Allergies   Current Outpatient Prescriptions:  .  ALPRAZolam (XANAX) 0.5 MG tablet, TAKE 1/2 TO 1 TABLET BY MOUTH TWICE DAILY AS NEEDED., Disp: 45 tablet, Rfl: 1 .  aspirin 81 MG tablet, Take by mouth., Disp: ,  Rfl:  .  glucose blood (ONE TOUCH ULTRA TEST) test strip, Check blood sugar once daily, Disp: 100 each, Rfl: 12 .  hydrochlorothiazide (MICROZIDE) 12.5 MG capsule, TAKE ONE CAPSULE BY MOUTH DAILY, Disp: 30 capsule, Rfl: 5 .  ibuprofen (ADVIL,MOTRIN) 200 MG tablet, , Disp: , Rfl:  .  metFORMIN (GLUCOPHAGE) 500 MG tablet, Take 1 tablet (500 mg total) by mouth 2 (two) times daily with a meal., Disp: 60 tablet, Rfl: 3 .  metoprolol tartrate (LOPRESSOR) 25 MG tablet, TAKE 1 TABLET(25 MG) BY MOUTH TWICE DAILY, Disp: 60 tablet, Rfl: 0 .  Multiple Vitamin tablet, , Disp: , Rfl:  .  Omega-3 Fatty Acids (FISH OIL) 1200 MG CPDR, Take by mouth daily., Disp: , Rfl:  .  simvastatin (ZOCOR) 10 MG tablet, Take 1 tablet (10 mg total) by mouth at bedtime., Disp: 30 tablet, Rfl: 6  Review of Systems  Constitutional: Negative.   Respiratory: Negative.   Cardiovascular: Negative.   Endocrine: Negative.   Musculoskeletal: Negative.   Skin: Negative.   Neurological: Positive for dizziness.       Comes and goes  Psychiatric/Behavioral: Negative.     Social History  Substance Use Topics  . Smoking status: Never Smoker  . Smokeless tobacco: Not on file  . Alcohol use No  Objective:   BP 112/72 (BP Location: Left Arm, Patient Position: Sitting, Cuff Size: Large)   Pulse 84   Temp 99.1 F (37.3 C)   Resp 16   Ht 5' (1.524 m)   Wt 218 lb (98.9 kg)   BMI 42.58 kg/m   Physical Exam  Constitutional: She appears well-developed and well-nourished. No distress.  Neck: Normal range of motion. Neck supple. No JVD present. No tracheal deviation present. No thyromegaly present.  Cardiovascular: Normal rate, regular rhythm and normal heart sounds.  Exam reveals no gallop and no friction rub.   No murmur heard. Pulmonary/Chest: Effort normal and breath sounds normal. No respiratory distress. She has no wheezes. She has no rales.  Musculoskeletal: She exhibits no edema.  Lymphadenopathy:    She has no cervical  adenopathy.  Skin: She is not diaphoretic.  Psychiatric: Her speech is normal and behavior is normal. Thought content normal. She exhibits a depressed mood (flat affect).  Vitals reviewed.     Assessment & Plan:     1. Type 2 diabetes mellitus without complication, without long-term current use of insulin (HCC) HgBA1c increased again to 8.2. Patient refuses to increase metformin secondary to diarrhea. She also refuses to start another medication to lower A1c as she wants to continue lifestyle modifications. We discussed in long detail complications that could arise from not controlling her diabetes. She voiced understanding but states she wants to try harder with lifestyle modifications as she has not been doing this. - POCT glycosylated hemoglobin (Hb A1C) - phentermine (ADIPEX-P) 37.5 MG tablet; Take 1 tablet (37.5 mg total) by mouth daily before breakfast.  Dispense: 30 tablet; Refill: 0  2. Encounter for weight loss counseling She is interested in using an appetite suppressant to help her lose weight and control her appetite better for her diabetes versus adding another diabetic medication. I have agreed and will give her phentermine as below to help her with her weight loss. I did advise her that if she does not lose weight or if her A1c does not decrease that she will have to start a diabetic medication. I will see her back in 4 weeks to see how she is doing with the phentermine, her dieting and weight loss. - phentermine (ADIPEX-P) 37.5 MG tablet; Take 1 tablet (37.5 mg total) by mouth daily before breakfast.  Dispense: 30 tablet; Refill: 0  3. BMI 40.0-44.9, adult [facility](HCC) See above medical treatment plan. - phentermine (ADIPEX-P) 37.5 MG tablet; Take 1 tablet (37.5 mg total) by mouth daily before breakfast.  Dispense: 30 tablet; Refill: 0  4. Morbid obesity due to excess calories [facility](HCC) See above medical treatment plan. - phentermine (ADIPEX-P) 37.5 MG tablet; Take 1 tablet (37.5 mg  total) by mouth daily before breakfast.  Dispense: 30 tablet; Refill: 0  5. Essential (primary) hypertension Stable. Continue current medical treatment plan.  6. Hypercholesterolemia with hypertriglyceridemia Uncontrolled. Will work on lifestyle modification.       Margaretann LovelessJennifer M Harper Vandervoort, PA-C  [facility]

## 2016-02-27 ENCOUNTER — Other Ambulatory Visit: Payer: Self-pay | Admitting: Physician Assistant

## 2016-02-27 DIAGNOSIS — E119 Type 2 diabetes mellitus without complications: Secondary | ICD-10-CM

## 2016-03-09 ENCOUNTER — Ambulatory Visit (INDEPENDENT_AMBULATORY_CARE_PROVIDER_SITE_OTHER): Payer: Managed Care, Other (non HMO) | Admitting: Physician Assistant

## 2016-03-09 ENCOUNTER — Encounter: Payer: Self-pay | Admitting: Physician Assistant

## 2016-03-09 VITALS — BP 118/70 | HR 76 | Temp 98.1°F | Resp 16 | Ht 60.0 in | Wt 210.0 lb

## 2016-03-09 DIAGNOSIS — E119 Type 2 diabetes mellitus without complications: Secondary | ICD-10-CM | POA: Diagnosis not present

## 2016-03-09 DIAGNOSIS — Z713 Dietary counseling and surveillance: Secondary | ICD-10-CM | POA: Diagnosis not present

## 2016-03-09 DIAGNOSIS — Z6841 Body Mass Index (BMI) 40.0 and over, adult: Secondary | ICD-10-CM | POA: Diagnosis not present

## 2016-03-09 MED ORDER — PHENTERMINE HCL 37.5 MG PO TABS: 37.5000 mg | ORAL_TABLET | Freq: Every day | ORAL | 0 refills | Status: DC

## 2016-03-09 MED ORDER — METFORMIN HCL 1000 MG PO TABS: 1000.0000 mg | ORAL_TABLET | Freq: Two times a day (BID) | ORAL | 3 refills | Status: DC

## 2016-03-09 NOTE — Progress Notes (Signed)
Patient: Dana Wallace Female    DOB: [birth year]   60 y.o.   MRN: [id number] Visit Date: 03/09/2016  Today's Provider: Margaretann Loveless, PA-C   Chief Complaint  Patient presents with  . Follow-up    weight   Subjective:    HPI  Follow up for weight  The patient was last seen for this 4 weeks ago. Changes made at last visit include start phentermine. She was 218 pounds when she started the medication. She is 210 pounds today. She is using a food diary and trying to stay less than 1200 calories. She is only exercising minimally now.  She reports excellent compliance with treatment. She feels that condition is Improved. She is not having side effects.   ------------------------------------------------------------------------------------     No Known Allergies   Current Outpatient Prescriptions:  .  ALPRAZolam (XANAX) 0.5 MG tablet, TAKE 1/2 TO 1 TABLET BY MOUTH TWICE DAILY AS NEEDED., Disp: 45 tablet, Rfl: 1 .  aspirin 81 MG tablet, Take by mouth., Disp: , Rfl:  .  glucose blood (ONE TOUCH ULTRA TEST) test strip, Check blood sugar once daily, Disp: 100 each, Rfl: 12 .  hydrochlorothiazide (MICROZIDE) 12.5 MG capsule, TAKE ONE CAPSULE BY MOUTH DAILY, Disp: 30 capsule, Rfl: 5 .  ibuprofen (ADVIL,MOTRIN) 200 MG tablet, , Disp: , Rfl:  .  metFORMIN (GLUCOPHAGE) 500 MG tablet, TAKE 1 TABLET(500 MG) BY MOUTH TWICE DAILY WITH A MEAL, Disp: 60 tablet, Rfl: 5 .  metoprolol tartrate (LOPRESSOR) 25 MG tablet, TAKE 1 TABLET(25 MG) BY MOUTH TWICE DAILY, Disp: 60 tablet, Rfl: 0 .  Multiple Vitamin tablet, , Disp: , Rfl:  .  Omega-3 Fatty Acids (FISH OIL) 1200 MG CPDR, Take by mouth daily., Disp: , Rfl:  .  phentermine (ADIPEX-P) 37.5 MG tablet, Take 1 tablet (37.5 mg total) by mouth daily before breakfast., Disp: 30 tablet, Rfl: 0 .  simvastatin (ZOCOR) 10 MG tablet, Take 1 tablet (10 mg total) by mouth at bedtime., Disp: 30 tablet, Rfl: 6 .  mometasone (ELOCON) 0.1 % cream,  , Disp: , Rfl:   Review of Systems  Constitutional: Negative.   Cardiovascular: Negative.   Gastrointestinal: Negative.   Endocrine: Negative.   Neurological: Negative.   Psychiatric/Behavioral: Negative.     Social History  Substance Use Topics  . Smoking status: Never Smoker  . Smokeless tobacco: Never Used  . Alcohol use No   Objective:   BP 118/70 (BP Location: Left Arm, Patient Position: Sitting, Cuff Size: Large)   Pulse 76   Temp 98.1 F (36.7 C) (Oral)   Resp 16   Ht 5' (1.524 m)   Wt 210 lb (95.3 kg)   SpO2 97%   BMI 41.01 kg/m   Physical Exam  Constitutional: She appears well-developed and well-nourished. No distress.  Neck: Normal range of motion. Neck supple.  Cardiovascular: Normal rate, regular rhythm and normal heart sounds.  Exam reveals no gallop and no friction rub.   No murmur heard. Pulmonary/Chest: Effort normal and breath sounds normal. No respiratory distress. She has no wheezes. She has no rales.  Musculoskeletal: She exhibits no edema.  Skin: She is not diaphoretic.  Psychiatric: She has a normal mood and affect. Her behavior is normal. Judgment and thought content normal.  Vitals reviewed.     Assessment & Plan:     1. Type 2 diabetes mellitus without complication, without long-term current use of insulin (HCC) Doing well with her weight loss  currently. Will continue phentermine as below. Will also increase metformin to 1000mg  BID as below. She agrees. We will check A1c and weight when she returns in 4 weeks. - phentermine (ADIPEX-P) 37.5 MG tablet; Take 1 tablet (37.5 mg total) by mouth daily before breakfast.  Dispense: 30 tablet; Refill: 0 - metFORMIN (GLUCOPHAGE) 1000 MG tablet; Take 1 tablet (1,000 mg total) by mouth 2 (two) times daily with a meal.  Dispense: 180 tablet; Refill: 3  2. Encounter for weight loss counseling See above medical treatment plan. - phentermine (ADIPEX-P) 37.5 MG tablet; Take 1 tablet (37.5 mg total) by mouth  daily before breakfast.  Dispense: 30 tablet; Refill: 0  3. BMI 40.0-44.9, adult [facility](HCC) See above medical treatment plan. - phentermine (ADIPEX-P) 37.5 MG tablet; Take 1 tablet (37.5 mg total) by mouth daily before breakfast.  Dispense: 30 tablet; Refill: 0  4. Morbid obesity due to excess calories [facility](HCC) See above medical treatment plan. - phentermine (ADIPEX-P) 37.5 MG tablet; Take 1 tablet (37.5 mg total) by mouth daily before breakfast.  Dispense: 30 tablet; Refill: 0       Margaretann LovelessJennifer M Kodi Steil, PA-C  [facility]

## 2016-03-09 NOTE — Patient Instructions (Signed)

## 2016-03-19 ENCOUNTER — Other Ambulatory Visit: Payer: Self-pay | Admitting: Family Medicine

## 2016-03-19 DIAGNOSIS — I10 Essential (primary) hypertension: Secondary | ICD-10-CM

## 2016-03-24 ENCOUNTER — Other Ambulatory Visit: Payer: Self-pay | Admitting: Physician Assistant

## 2016-03-24 DIAGNOSIS — F419 Anxiety disorder, unspecified: Secondary | ICD-10-CM

## 2016-03-24 DIAGNOSIS — F32A Depression, unspecified: Secondary | ICD-10-CM

## 2016-03-24 DIAGNOSIS — F329 Major depressive disorder, single episode, unspecified: Secondary | ICD-10-CM

## 2016-03-26 NOTE — Telephone Encounter (Signed)
Pres[facility] [place]

## 2016-04-04 IMAGING — CR DG CHEST 2V
1 series · 2 of 2 positions shown · non-contrast
Comparison: Report 09/22/2004 no images available

CLINICAL DATA: Cough, fever for 6 days, shortness of Breath

EXAM:
CHEST  2 VIEW

[Series 1: kdxr chest pa (or ap) and lat · 0.14mm/px · 2 of 2 slices shown]
[im 1/2]
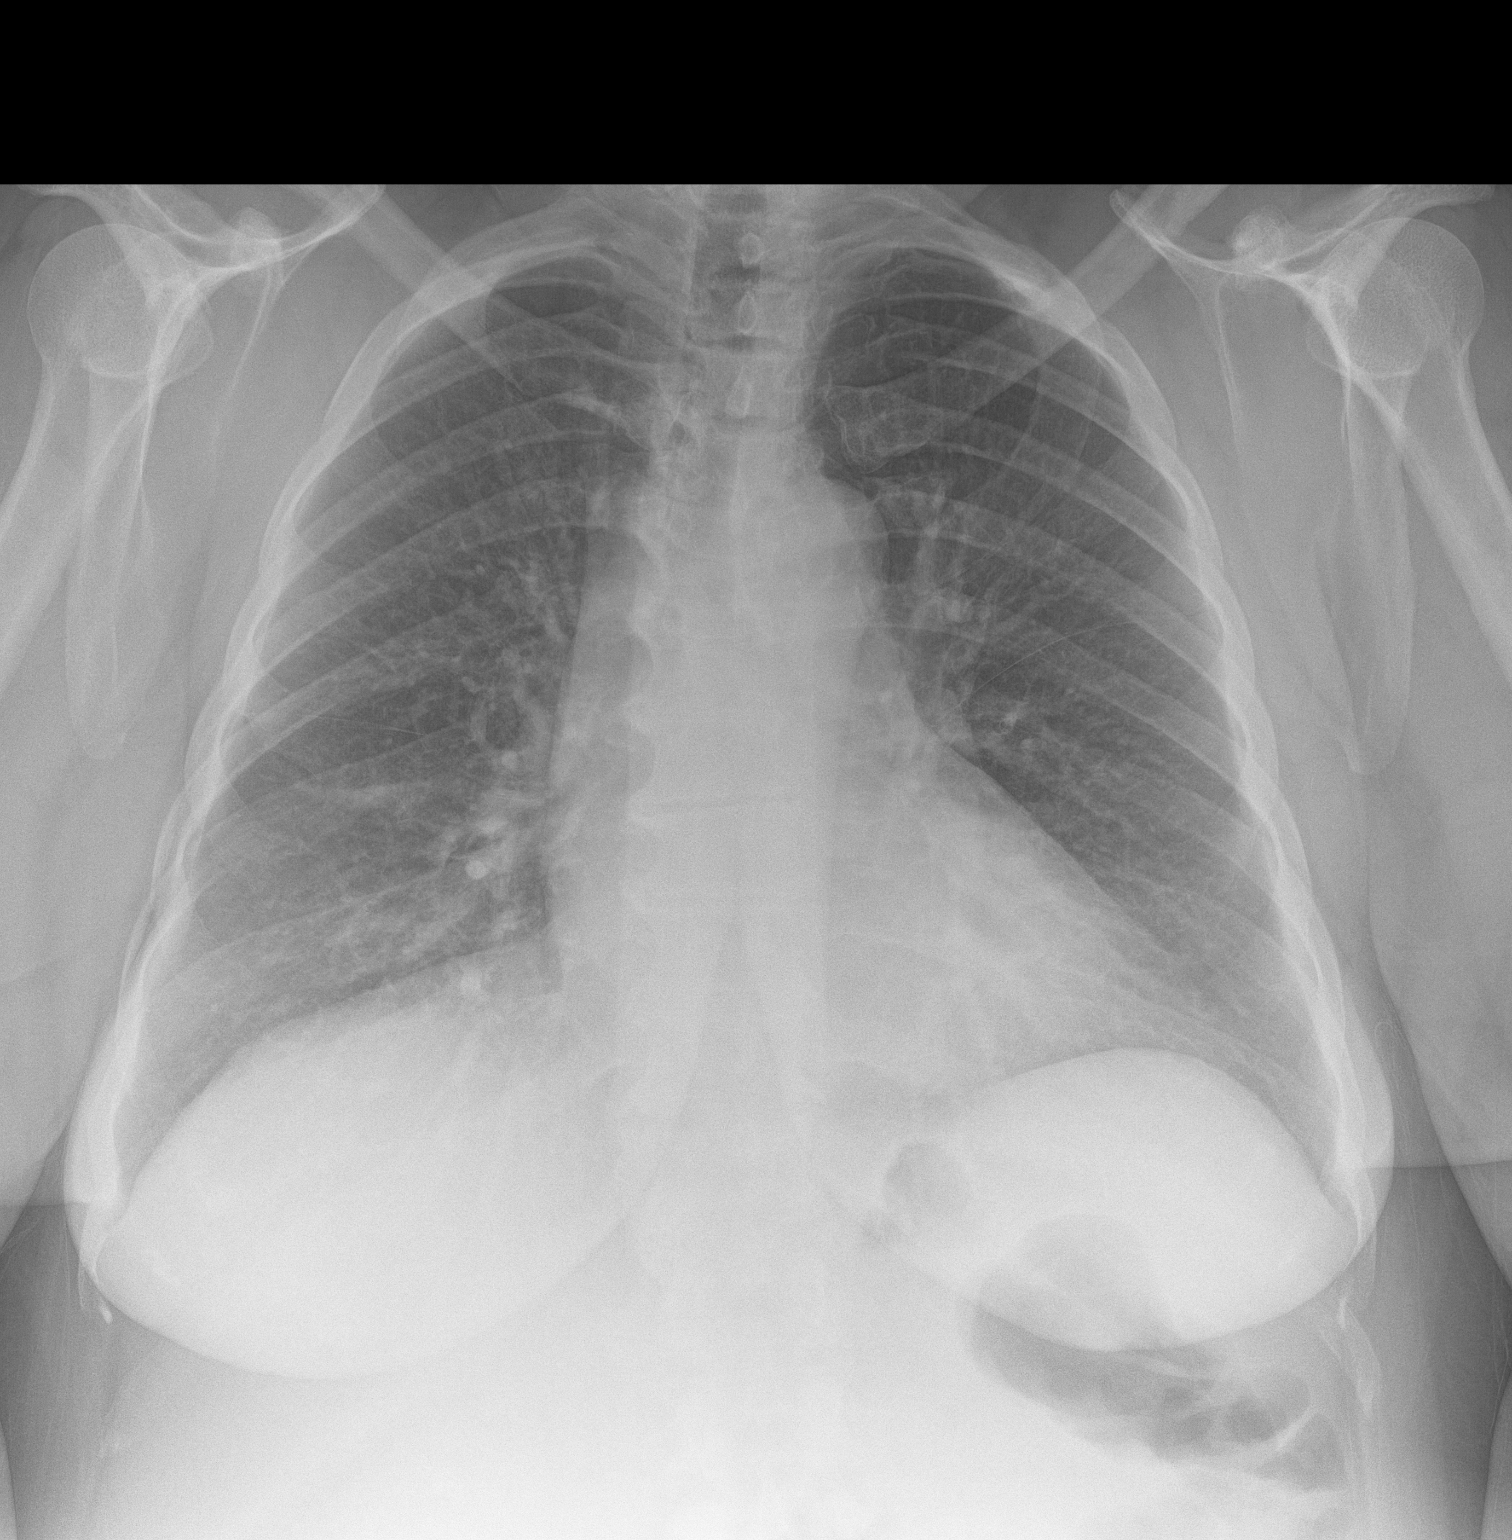
[im 2/2]
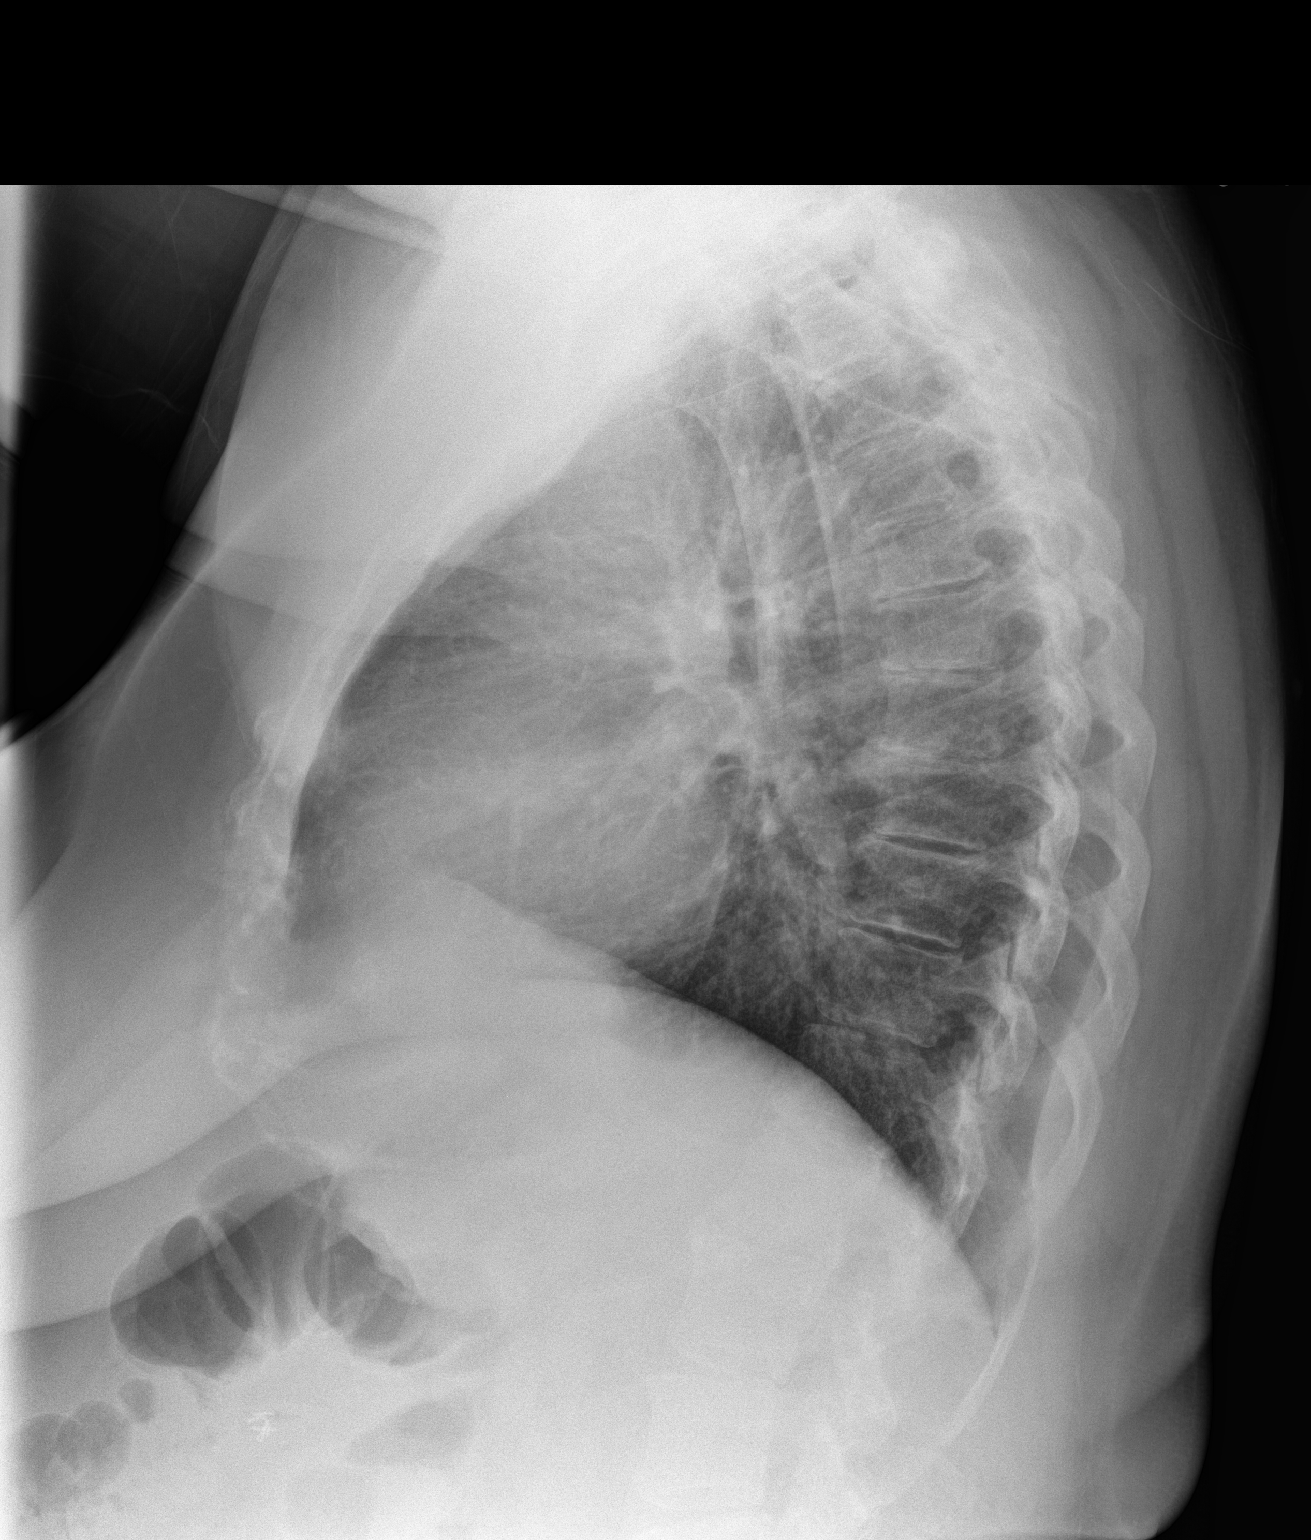

[2 of 2 positions shown; findings below may reference images not displayed]

FINDINGS: Cardiomediastinal silhouette is unremarkable. Mild elevation of the
right hemidiaphragm. No acute infiltrate or pleural effusion. No
pulmonary edema. Degenerative changes thoracic spine.
IMPRESSION: No active cardiopulmonary disease. Degenerative changes thoracic
spine.

## 2016-04-18 ENCOUNTER — Ambulatory Visit: Payer: Managed Care, Other (non HMO) | Admitting: Physician Assistant

## 2016-05-22 ENCOUNTER — Other Ambulatory Visit: Payer: Self-pay | Admitting: Physician Assistant

## 2016-05-22 DIAGNOSIS — I10 Essential (primary) hypertension: Secondary | ICD-10-CM

## 2016-05-22 DIAGNOSIS — E782 Mixed hyperlipidemia: Secondary | ICD-10-CM

## 2016-06-07 ENCOUNTER — Ambulatory Visit: Payer: Managed Care, Other (non HMO) | Admitting: Physician Assistant

## 2016-08-09 ENCOUNTER — Ambulatory Visit: Payer: Managed Care, Other (non HMO) | Admitting: Physician Assistant

## 2016-09-12 ENCOUNTER — Encounter: Payer: Self-pay | Admitting: Physician Assistant

## 2016-09-12 ENCOUNTER — Ambulatory Visit (INDEPENDENT_AMBULATORY_CARE_PROVIDER_SITE_OTHER): Payer: Managed Care, Other (non HMO) | Admitting: Physician Assistant

## 2016-09-12 VITALS — BP 118/72 | HR 72 | Temp 98.1°F | Resp 16 | Ht 61.25 in | Wt 212.0 lb

## 2016-09-12 DIAGNOSIS — Z6837 Body mass index (BMI) 37.0-37.9, adult: Secondary | ICD-10-CM | POA: Diagnosis not present

## 2016-09-12 DIAGNOSIS — I10 Essential (primary) hypertension: Secondary | ICD-10-CM | POA: Diagnosis not present

## 2016-09-12 DIAGNOSIS — E119 Type 2 diabetes mellitus without complications: Secondary | ICD-10-CM

## 2016-09-12 LAB — POCT GLYCOSYLATED HEMOGLOBIN (HGB A1C)
Est. average glucose Bld gHb Est-mCnc: 163
Hemoglobin A1C: 7.3

## 2016-09-12 NOTE — Patient Instructions (Signed)

## 2016-09-12 NOTE — Progress Notes (Signed)
Patient: Dana Wallace Female    DOB: [birth year]   61 y.o.   MRN: [id number] Visit Date: 09/13/2016  Today's Provider: Margaretann Loveless, PA-C   Chief Complaint  Patient presents with  . Weight Check  . Hypertension  . Diabetes   Subjective:    HPI  Hypertension, follow-up:  BP Readings from Last 3 Encounters:  09/12/16 118/72  03/09/16 118/70  02/06/16 112/72    She was last seen for hypertension 6 months ago.  BP at that visit was 118/70. Management since that visit includes no changes. She reports good compliance with treatment. She is not having side effects.  She is not exercising. She is adherent to low salt diet.   Outside blood pressures are not being checked. She is experiencing none.  Patient denies exertional chest pressure/discomfort, lower extremity edema and palpitations.   Cardiovascular risk factors include diabetes mellitus.     Weight trend: stable Wt Readings from Last 3 Encounters:  09/12/16 212 lb (96.2 kg)  03/09/16 210 lb (95.3 kg)  02/06/16 218 lb (98.9 kg)    Current diet: well balanced    Diabetes Mellitus Type II, Follow-up:   Lab Results  Component Value Date   HGBA1C 7.3 09/12/2016   HGBA1C 8.2 02/06/2016   HGBA1C 8.0 (H) 11/04/2015    Last seen for diabetes 6 months ago.  Management since then includes increased Metformin to . She reports good compliance with treatment. She is not having side effects.  Current symptoms include none and have been stable. Home blood sugar records: trend: stable per patient.  Episodes of hypoglycemia? no   Current Insulin Regimen: none Most Recent Eye Exam: within the past year.  Weight trend: stable Prior visit with dietician: no Current diet: well balanced Current exercise: none  Pertinent Labs:    Component Value Date/Time   CHOL 216 (H) 11/04/2015 1503   TRIG 171 (H) 11/04/2015 1503   HDL 48 11/04/2015 1503   LDLCALC 134 (H) 11/04/2015 1503   CREATININE  0.58 11/04/2015 1503    Wt Readings from Last 3 Encounters:  09/12/16 212 lb (96.2 kg)  03/09/16 210 lb (95.3 kg)  02/06/16 218 lb (98.9 kg)      No Known Allergies   Current Outpatient Prescriptions:  .  ALPRAZolam (XANAX) 0.5 MG tablet, TAKE 1/2 TO 1 TABLET BY MOUTH TWICE DAILY AS NEEDED, Disp: 45 tablet, Rfl: 3 .  aspirin 81 MG tablet, Take by mouth., Disp: , Rfl:  .  glucose blood (ONE TOUCH ULTRA TEST) test strip, Check blood sugar once daily, Disp: 100 each, Rfl: 12 .  hydrochlorothiazide (MICROZIDE) 12.5 MG capsule, TAKE ONE CAPSULE BY MOUTH DAILY, Disp: 30 capsule, Rfl: 5 .  ibuprofen (ADVIL,MOTRIN) 200 MG tablet, , Disp: , Rfl:  .  metFORMIN (GLUCOPHAGE) 1000 MG tablet, Take 1 tablet (1,000 mg total) by mouth 2 (two) times daily with a meal., Disp: 180 tablet, Rfl: 3 .  metoprolol tartrate (LOPRESSOR) 25 MG tablet, TAKE 1 TABLET(25 MG) BY MOUTH TWICE DAILY, Disp: 60 tablet, Rfl: 5 .  mometasone (ELOCON) 0.1 % cream, , Disp: , Rfl:  .  Multiple Vitamin tablet, , Disp: , Rfl:  .  Omega-3 Fatty Acids (FISH OIL) 1200 MG CPDR, Take by mouth daily., Disp: , Rfl:  .  phentermine (ADIPEX-P) 37.5 MG tablet, Take 1 tablet (37.5 mg total) by mouth daily before breakfast., Disp: 30 tablet, Rfl: 0 .  simvastatin (ZOCOR) 10 MG tablet,  TAKE 1 TABLET(10 MG) BY MOUTH AT BEDTIME, Disp: 30 tablet, Rfl: 5  Review of Systems  Constitutional: Negative.   Respiratory: Negative.   Cardiovascular: Negative.   Gastrointestinal: Negative.   Endocrine: Negative.   Musculoskeletal: Negative.     Social History  Substance Use Topics  . Smoking status: Never Smoker  . Smokeless tobacco: Never Used  . Alcohol use No   Objective:   BP 118/72 (BP Location: Left Arm, Patient Position: Sitting, Cuff Size: Normal)   Pulse 72   Temp 98.1 F (36.7 C)   Resp 16   Ht 5' 1.25" (1.556 m)   Wt 212 lb (96.2 kg)   BMI 39.73 kg/m  Vitals:   09/12/16 1608  BP: 118/72  Pulse: 72  Resp: 16  Temp:  98.1 F (36.7 C)  Weight: 212 lb (96.2 kg)  Height: 5' 1.25" (1.556 m)     Physical Exam  Constitutional: She appears well-developed and well-nourished. No distress.  Neck: Normal range of motion. Neck supple. No tracheal deviation present. No thyromegaly present.  Cardiovascular: Normal rate, regular rhythm and normal heart sounds.  Exam reveals no gallop and no friction rub.   No murmur heard. Pulmonary/Chest: Effort normal and breath sounds normal. No respiratory distress. She has no wheezes. She has no rales.  Musculoskeletal: She exhibits no edema.  Lymphadenopathy:    She has no cervical adenopathy.  Skin: She is not diaphoretic.  Vitals reviewed.     Assessment & Plan:     1. Essential (primary) hypertension Stable on metoprolol  daily, HCTZ 12.5mg  daily. I will see her back in 6 months to recheck and will check all labs.   2. Type 2 diabetes mellitus without complication, without long-term current use of insulin (HCC) A1c much improved to 7.3 from 8.2. Will continue metformin  BID. I will see her back in 6 months to recheck.  - POCT glycosylated hemoglobin (Hb A1C)  3. BMI 37.0-37.9, adult Had a long discussion about dieting (fad diets) vs lifestyle changes. Discussed trying to find healthier changes that she can maintain for a long time down the road. She is interested in trying food diary/calorie counting so she does not have to limit foods, just eat more in moderation. She feels this may be a healthier alternative to keep her weight from yo-yo-ing. Also discussed adding exercise and that she did not have to had 45 min to an hour immediately. Discussed starting slow and doing small things throughout the day or doing shorter durations of 10-15 minutes throughout the day when she has time. She agrees and is going to try to implement this.   I spent approximately 35 minutes with the patient today. Over 50% of this time was spent with counseling and educating the  patient.      Margaretann Loveless, PA-C  [facility]

## 2016-09-21 ENCOUNTER — Other Ambulatory Visit: Payer: Self-pay | Admitting: Physician Assistant

## 2016-09-21 DIAGNOSIS — I10 Essential (primary) hypertension: Secondary | ICD-10-CM

## 2016-09-21 MED ORDER — METOPROLOL TARTRATE 25 MG PO TABS: ORAL_TABLET | ORAL | 5 refills | Status: DC

## 2016-09-21 NOTE — Telephone Encounter (Signed)
[facility] faxed a request on the following medication. Thanks CC  metoprolol tartrate (LOPRESSOR) 25 MG tablet  Take 1 tablet ( 25 MG ) by mouth twice daily.

## 2016-11-28 ENCOUNTER — Other Ambulatory Visit: Payer: Self-pay | Admitting: Physician Assistant

## 2016-11-28 DIAGNOSIS — E782 Mixed hyperlipidemia: Secondary | ICD-10-CM

## 2016-11-28 DIAGNOSIS — I10 Essential (primary) hypertension: Secondary | ICD-10-CM

## 2016-12-29 ENCOUNTER — Other Ambulatory Visit: Payer: Self-pay | Admitting: Physician Assistant

## 2016-12-29 DIAGNOSIS — F329 Major depressive disorder, single episode, unspecified: Secondary | ICD-10-CM

## 2016-12-29 DIAGNOSIS — F32A Depression, unspecified: Secondary | ICD-10-CM

## 2016-12-29 DIAGNOSIS — F419 Anxiety disorder, unspecified: Secondary | ICD-10-CM

## 2016-12-31 NOTE — Telephone Encounter (Signed)
[facility] Spoke to one of the pharmacist.  Thanks,  -Joseline

## 2017-03-14 ENCOUNTER — Ambulatory Visit: Payer: Managed Care, Other (non HMO) | Admitting: Physician Assistant

## 2017-03-21 ENCOUNTER — Other Ambulatory Visit: Payer: Self-pay | Admitting: Physician Assistant

## 2017-03-21 DIAGNOSIS — I10 Essential (primary) hypertension: Secondary | ICD-10-CM

## 2017-03-21 DIAGNOSIS — E119 Type 2 diabetes mellitus without complications: Secondary | ICD-10-CM

## 2017-03-27 ENCOUNTER — Other Ambulatory Visit: Payer: Self-pay | Admitting: Physician Assistant

## 2017-03-27 DIAGNOSIS — F32A Depression, unspecified: Secondary | ICD-10-CM

## 2017-03-27 DIAGNOSIS — F419 Anxiety disorder, unspecified: Secondary | ICD-10-CM

## 2017-03-27 DIAGNOSIS — F329 Major depressive disorder, single episode, unspecified: Secondary | ICD-10-CM

## 2017-03-28 NOTE — Telephone Encounter (Signed)
Prescription called in to [facility] in [place].  Thanks,  -Alamin Mccuiston 

## 2017-05-22 ENCOUNTER — Ambulatory Visit: Payer: Managed Care, Other (non HMO) | Admitting: Physician Assistant

## 2017-05-23 ENCOUNTER — Other Ambulatory Visit: Payer: Self-pay | Admitting: Physician Assistant

## 2017-05-23 DIAGNOSIS — E782 Mixed hyperlipidemia: Secondary | ICD-10-CM

## 2017-05-23 DIAGNOSIS — I10 Essential (primary) hypertension: Secondary | ICD-10-CM

## 2017-05-23 NOTE — Telephone Encounter (Signed)
meds refilled 

## 2017-07-24 ENCOUNTER — Encounter: Payer: Self-pay | Admitting: Physician Assistant

## 2017-07-24 ENCOUNTER — Ambulatory Visit (INDEPENDENT_AMBULATORY_CARE_PROVIDER_SITE_OTHER): Payer: Managed Care, Other (non HMO) | Admitting: Physician Assistant

## 2017-07-24 VITALS — BP 138/80 | HR 101 | Resp 16 | Wt 211.0 lb

## 2017-07-24 DIAGNOSIS — Z114 Encounter for screening for human immunodeficiency virus [HIV]: Secondary | ICD-10-CM

## 2017-07-24 DIAGNOSIS — E119 Type 2 diabetes mellitus without complications: Secondary | ICD-10-CM | POA: Diagnosis not present

## 2017-07-24 DIAGNOSIS — I10 Essential (primary) hypertension: Secondary | ICD-10-CM

## 2017-07-24 DIAGNOSIS — Z1211 Encounter for screening for malignant neoplasm of colon: Secondary | ICD-10-CM | POA: Diagnosis not present

## 2017-07-24 DIAGNOSIS — Z6839 Body mass index (BMI) 39.0-39.9, adult: Secondary | ICD-10-CM

## 2017-07-24 DIAGNOSIS — E782 Mixed hyperlipidemia: Secondary | ICD-10-CM

## 2017-07-24 DIAGNOSIS — Z1159 Encounter for screening for other viral diseases: Secondary | ICD-10-CM | POA: Diagnosis not present

## 2017-07-24 LAB — POCT UA - MICROALBUMIN: Microalbumin Ur, POC: 20 mg/L

## 2017-07-24 LAB — POCT GLYCOSYLATED HEMOGLOBIN (HGB A1C)
ESTIMATED AVERAGE GLUCOSE: 189
Hemoglobin A1C: 8.2

## 2017-07-24 MED ORDER — DAPAGLIFLOZIN PROPANEDIOL 5 MG PO TABS: 5.0000 mg | ORAL_TABLET | Freq: Every day | ORAL | 1 refills | Status: DC

## 2017-07-24 NOTE — Patient Instructions (Signed)
Dapagliflozin tablets  What is this medicine?  DAPAGLIFLOZIN (DAP a gli FLOE zin) helps to treat type 2 diabetes. It helps to control blood sugar. Treatment is combined with diet and exercise.  This medicine may be used for other purposes; ask your health care provider or pharmacist if you have questions.  COMMON BRAND NAME(S): Farxiga  What should I tell my health care provider before I take this medicine?  They need to know if you have any of these conditions:  -bladder cancer  -dehydration  -diabetic ketoacidosis  -diet low in salt  -eating less due to illness, surgery, dieting, or any other reason  -having surgery  -high cholesterol  -history of pancreatitis or pancreas problems  -history of yeast infection of the penis or vagina  -if you often drink alcohol  -infections in the bladder, kidneys, or urinary tract  -kidney disease  -low blood pressure  -on hemodialysis  -problems urinating  -type 1 diabetes  -uncircumcised female  -an unusual or allergic reaction to dapagliflozin, other medicines, foods, dyes, or preservatives  -pregnant or trying to get pregnant  -breast-feeding  How should I use this medicine?  Take this medicine by mouth with a glass of water. Follow the directions on the prescription label. You can take it with or without food. If it upsets your stomach, take it with food. Take this medicine in the morning. Take your dose at the same time each day. Do not take more often than directed. Do not stop taking except on your doctor's advice.  A special MedGuide will be given to you by the pharmacist with each prescription and refill. Be sure to read this information carefully each time.  Talk to your pediatrician regarding the use of this medicine in children. Special care may be needed.  Overdosage: If you think you have taken too much of this medicine contact a poison control center or emergency room at once.  NOTE: This medicine is only for you. Do not share this medicine with others.  What if I  miss a dose?  If you miss a dose, take it as soon as you can. If it is almost time for your next dose, take only that dose. Do not take double or extra doses.  What may interact with this medicine?  Do not take this medicine with any of the following medications:  -gatifloxacin  This medicine may also interact with the following medications:  -alcohol  -certain medicines for blood pressure, heart disease  -diuretics  -insulin  -nateglinide  -pioglitazone  -quinolone antibiotics like ciprofloxacin, levofloxacin, ofloxacin  -repaglinide  -some herbal dietary supplements  -steroid medicines like prednisone or cortisone  -sulfonylureas like glimepiride, glipizide, glyburide  -thyroid medicine  This list may not describe all possible interactions. Give your health care provider a list of all the medicines, herbs, non-prescription drugs, or dietary supplements you use. Also tell them if you smoke, drink alcohol, or use illegal drugs. Some items may interact with your medicine.  What should I watch for while using this medicine?  Visit your doctor or health care professional for regular checks on your progress.  This medicine can cause a serious condition in which there is too much acid in the blood. If you develop nausea, vomiting, stomach pain, unusual tiredness, or breathing problems, stop taking this medicine and call your doctor right away. If possible, use a ketone dipstick to check for ketones in your urine.  A test called the HbA1C (A1C) will be monitored. This   with you in case you have symptoms of low blood sugar. Examples include hard sugar candy or glucose tablets. Make sure others know that you can choke if you eat or drink when  you develop serious symptoms of low blood sugar, such as seizures or unconsciousness. They must get medical help at once. Tell your doctor or health care professional if you have high blood sugar. You might need to change the dose of your medicine. If you are sick or exercising more than usual, you might need to change the dose of your medicine. Do not skip meals. Ask your doctor or health care professional if you should avoid alcohol. Many nonprescription cough and cold products contain sugar or alcohol. These can affect blood sugar. Wear a medical ID bracelet or chain, and carry a card that describes your disease and details of your medicine and dosage times. What side effects may I notice from receiving this medicine? Side effects that you should report to your doctor or health care professional as soon as possible: -allergic reactions like skin rash, itching or hives, swelling of the face, lips, or tongue -breathing problems -dizziness -feeling faint or lightheaded, falls -muscle weakness -nausea, vomiting, unusual stomach upset or pain -signs and symptoms of low blood sugar such as feeling anxious, confusion, dizziness, increased hunger, unusually weak or tired, sweating, shakiness, cold, irritable, headache, blurred vision, fast heartbeat, loss of consciousness -signs and symptoms of a urinary tract infection, such as fever, chills, a burning feeling when urinating, blood in the urine, back pain -trouble passing urine or change in the amount of urine, including an urgent need to urinate more often, in larger amounts, or at night -penile discharge, itching, or pain in men -unusual tiredness -vaginal discharge, itching, or odor in women Side effects that usually do not require medical attention (report to your doctor or health care professional if they continue or are bothersome): -constipation -mild increase in urination -stuffy or runny nose -sore throat -thirsty This list may not  describe all possible side effects. Call your doctor for medical advice about side effects. You may report side effects to FDA at 1-800-FDA-1088. Where should I keep my medicine? Keep out of the reach of children. Store at room temperature between 15 and 30 degrees C (59 and 86 degrees F). Throw away any unused medicine after the expiration date. NOTE: This sheet is a summary. It may not cover all possible information. If you have questions about this medicine, talk to your doctor, pharmacist, or health care provider.  2018 Elsevier/Gold Standard (2015-06-09 08:46:40)  

## 2017-07-24 NOTE — Progress Notes (Signed)
Patient: Dana Wallace Female    DOB: 1956/03/11   62 y.o.   MRN: [id number] Visit Date: 07/25/2017  Today's Provider: Margaretann Loveless, PA-C   Chief Complaint  Patient presents with  . Follow-up   Subjective:    HPI  Hypertension, follow-up:  BP Readings from Last 3 Encounters:  07/24/17 138/80  09/12/16 118/72  03/09/16 118/70    She was last seen for hypertension 11 months ago.  BP at that visit was 118/72. Management since that visit includes no changes. She reports good compliance with treatment. She is not having side effects.  She is not exercising. She is adherent to low salt diet.   Outside blood pressures ranges between 120's-130's/70's. She is experiencing none.  Patient denies exertional chest pressure/discomfort, lower extremity edema and palpitations.   Cardiovascular risk factors include diabetes mellitus.                Weight trend: stable  Wt Readings from Last 3 Encounters:  07/24/17 211 lb (95.7 kg)  09/12/16 212 lb (96.2 kg)  03/09/16 210 lb (95.3 kg)   Current diet: well balanced    Diabetes Mellitus Type II, Follow-up:   RecentLabs       Lab Results  Component Value Date   HGBA1C 7.3 09/12/2016   HGBA1C 8.2 02/06/2016   HGBA1C 8.0 (H) 11/04/2015      Last seen for diabetes 11 months ago.  Management since then includes continue Metformin 1000 mg BID. She reports good compliance with treatment. She is not having side effects.  Current symptoms include none and have been stable.  Home blood sugar records: trend: stable per patient 120's-180's.  Episodes of hypoglycemia? no              Current Insulin Regimen: none Most Recent Eye Exam: within the past year. She reports that she is due. Weight trend: stable Prior visit with dietician: no Current diet: well balanced Current exercise: none     No Known Allergies   Current Outpatient Medications:  .  ALPRAZolam (XANAX) 0.5 MG tablet, TAKE 1/2  TO 1 TABLET BY MOUTH TWICE DAILY AS NEEDED, Disp: 45 tablet, Rfl: 5 .  aspirin 81 MG tablet, Take by mouth., Disp: , Rfl:  .  glucose blood (ONE TOUCH ULTRA TEST) test strip, Check blood sugar once daily, Disp: 100 each, Rfl: 12 .  hydrochlorothiazide (MICROZIDE) 12.5 MG capsule, TAKE ONE CAPSULE BY MOUTH DAILY, Disp: 90 capsule, Rfl: 1 .  ibuprofen (ADVIL,MOTRIN) 200 MG tablet, , Disp: , Rfl:  .  metFORMIN (GLUCOPHAGE) 1000 MG tablet, TAKE 1 TABLET(1000 MG) BY MOUTH TWICE DAILY WITH A MEAL, Disp: 180 tablet, Rfl: 1 .  metoprolol tartrate (LOPRESSOR) 25 MG tablet, TAKE 1 TABLET(25 MG) BY MOUTH TWICE DAILY, Disp: 180 tablet, Rfl: 1 .  mometasone (ELOCON) 0.1 % cream, , Disp: , Rfl:  .  Multiple Vitamin tablet, , Disp: , Rfl:  .  Omega-3 Fatty Acids (FISH OIL) 1200 MG CPDR, Take by mouth daily., Disp: , Rfl:  .  simvastatin (ZOCOR) 10 MG tablet, TAKE 1 TABLET(10 MG) BY MOUTH AT BEDTIME, Disp: 90 tablet, Rfl: 1 .  dapagliflozin propanediol (FARXIGA) 5 MG TABS tablet, Take 5 mg by mouth daily., Disp: 90 tablet, Rfl: 1  Review of Systems  Constitutional: Positive for fatigue. Negative for activity change, appetite change and fever.  HENT: Negative.   Respiratory: Negative for cough, chest tightness and shortness of breath.  Cardiovascular: Negative for chest pain, palpitations and leg swelling.  Gastrointestinal: Negative for abdominal pain.  Neurological: Negative.   Psychiatric/Behavioral: Negative for dysphoric mood and sleep disturbance. The patient is not nervous/anxious.     Social History   Tobacco Use  . Smoking status: Never Smoker  . Smokeless tobacco: Never Used  Substance Use Topics  . Alcohol use: No   Objective:   BP 138/80 (BP Location: Left Arm, Patient Position: Sitting, Cuff Size: Normal)   Pulse (!) 101   Resp 16   Wt 211 lb (95.7 kg)   BMI 39.54 kg/m    Physical Exam  Constitutional: She appears well-developed and well-nourished. No distress.  Neck: Normal  range of motion. Neck supple. No tracheal deviation present. No thyromegaly present.  Cardiovascular: Normal rate, regular rhythm and normal heart sounds. Exam reveals no gallop and no friction rub.  No murmur heard. Pulmonary/Chest: Effort normal and breath sounds normal. No respiratory distress. She has no wheezes. She has no rales.  Lymphadenopathy:    She has no cervical adenopathy.  Skin: She is not diaphoretic.  Psychiatric: She has a normal mood and affect. Her behavior is normal. Judgment and thought content normal.  Vitals reviewed.      Assessment & Plan:     1. Essential (primary) hypertension Stable. Continue Metoprolol 25mg , HCTZ 12.5mg . Will check labs as below and f/u pending results. - CBC w/Diff/Platelet - Comprehensive Metabolic Panel (CMET)  2. Type 2 diabetes mellitus without complication, without long-term current use of insulin (HCC) Microalbumin stable at 20. A1c up to 8.2. Will add Marcelline DeistFarxiga as below. Continue Metformin 1000mg  BID. Will check labs as below and f/u pending results. I will see her back in 3 months for recheck of A1c. - POCT UA - Microalbumin - POCT glycosylated hemoglobin (Hb A1C) - CBC w/Diff/Platelet - Comprehensive Metabolic Panel (CMET) - dapagliflozin propanediol (FARXIGA) 5 MG TABS tablet; Take 5 mg by mouth daily.  Dispense: 90 tablet; Refill: 1  3. Hypercholesterolemia with hypertriglyceridemia Stable. Continue Simvastatin 10mg . Will check labs as below and f/u pending results. - Lipid panel - CBC w/Diff/Platelet - Comprehensive Metabolic Panel (CMET)  4. Need for hepatitis C screening test - Hepatitis C antibody  5. Encounter for screening for HIV - HIV antibody  6. Colon cancer screening - Cologuard  7. Class 2 severe obesity due to excess calories with serious comorbidity and body mass index (BMI) of 39.0 to 39.9 in adult [facility](HCC) Counseled patient on healthy lifestyle modifications including dieting and exercise.         Margaretann LovelessJennifer M Tayte Childers, PA-C  [facility]

## 2017-07-25 ENCOUNTER — Telehealth: Payer: Self-pay | Admitting: Physician Assistant

## 2017-07-25 NOTE — Telephone Encounter (Signed)
Order for cologuard faxed to [facility] °

## 2017-07-27 LAB — COMPREHENSIVE METABOLIC PANEL
ALK PHOS: 76 IU/L (ref 39–117)
ALT: 34 IU/L — AB (ref 0–32)
AST: 44 IU/L — ABNORMAL HIGH (ref 0–40)
Albumin/Globulin Ratio: 1.3 (ref 1.2–2.2)
Albumin: 4.3 g/dL (ref 3.5–5.5)
BILIRUBIN TOTAL: 0.5 mg/dL (ref 0.0–1.2)
BUN/Creatinine Ratio: 26 — ABNORMAL HIGH (ref 9–23)
BUN: 15 mg/dL (ref 6–24)
CHLORIDE: 97 mmol/L (ref 96–106)
CO2: 24 mmol/L (ref 20–29)
Calcium: 9.5 mg/dL (ref 8.7–10.2)
Creatinine, Ser: 0.58 mg/dL (ref 0.57–1.00)
GFR calc Af Amer: 117 mL/min/{1.73_m2} (ref >59)
GFR calc non Af Amer: 101 mL/min/{1.73_m2} (ref >59)
GLUCOSE: 186 mg/dL — AB (ref 65–99)
Globulin, Total: 3.2 g/dL (ref 1.5–4.5)
Potassium: 4 mmol/L (ref 3.5–5.2)
Sodium: 138 mmol/L (ref 134–144)
TOTAL PROTEIN: 7.5 g/dL (ref 6.0–8.5)

## 2017-07-27 LAB — CBC WITH DIFFERENTIAL/PLATELET
BASOS ABS: 0 10*3/uL (ref 0.0–0.2)
Basos: 0 %
EOS (ABSOLUTE): 0.2 10*3/uL (ref 0.0–0.4)
Eos: 2 %
Hematocrit: 40.9 % (ref 34.0–46.6)
Hemoglobin: 13.6 g/dL (ref 11.1–15.9)
IMMATURE GRANS (ABS): 0 10*3/uL (ref 0.0–0.1)
Immature Granulocytes: 0 %
LYMPHS: 23 %
Lymphocytes Absolute: 2.2 10*3/uL (ref 0.7–3.1)
MCH: 30.9 pg (ref 26.6–33.0)
MCHC: 33.3 g/dL (ref 31.5–35.7)
MCV: 93 fL (ref 79–97)
MONOS ABS: 0.7 10*3/uL (ref 0.1–0.9)
Monocytes: 7 %
NEUTROS ABS: 6.4 10*3/uL (ref 1.4–7.0)
NEUTROS PCT: 68 %
PLATELETS: 240 10*3/uL (ref 150–379)
RBC: 4.4 x10E6/uL (ref 3.77–5.28)
RDW: 13.8 % (ref 12.3–15.4)
WBC: 9.5 10*3/uL (ref 3.4–10.8)

## 2017-07-27 LAB — LIPID PANEL
CHOL/HDL RATIO: 3.7 ratio (ref 0.0–4.4)
Cholesterol, Total: 136 mg/dL (ref 100–199)
HDL: 37 mg/dL — AB (ref >39)
LDL CALC: 47 mg/dL (ref 0–99)
Triglycerides: 261 mg/dL — ABNORMAL HIGH (ref 0–149)
VLDL CHOLESTEROL CAL: 52 mg/dL — AB (ref 5–40)

## 2017-07-27 LAB — HIV ANTIBODY (ROUTINE TESTING W REFLEX): HIV Screen 4th Generation wRfx: NONREACTIVE

## 2017-07-27 LAB — HEPATITIS C ANTIBODY: Hep C Virus Ab: <0.1 s/co ratio (ref 0.0–0.9)

## 2017-07-29 ENCOUNTER — Telehealth: Payer: Self-pay | Admitting: *Deleted

## 2017-07-29 NOTE — Telephone Encounter (Signed)
LMOVM for pt to return call 

## 2017-07-29 NOTE — Telephone Encounter (Signed)
-----   Message from Margaretann LovelessJennifer M Burnette, PA-C sent at 07/29/2017  1:26 PM EDT ----- Liver enzymes back up slightly, not like previously. Limit tylenol and alcohol and fatty foods. Hep C negative. HIV screen negative. Blood count normal. Kidney function normal. Cholesterol stable.

## 2017-07-30 NOTE — Telephone Encounter (Signed)
LMTCB  Thanks,  -Joseline 

## 2017-07-30 NOTE — Telephone Encounter (Signed)
Pt just return call

## 2017-07-31 NOTE — Telephone Encounter (Signed)
Patient advised.KW 

## 2017-08-02 LAB — HM MAMMOGRAPHY

## 2017-08-14 LAB — COLOGUARD: COLOGUARD: NEGATIVE

## 2017-08-15 ENCOUNTER — Encounter: Payer: Self-pay | Admitting: Physician Assistant

## 2017-08-16 ENCOUNTER — Telehealth: Payer: Self-pay

## 2017-08-16 NOTE — Telephone Encounter (Signed)
Patient advised as directed below.  Thanks,  -Joseline 

## 2017-08-16 NOTE — Telephone Encounter (Signed)
-----   Message from Margaretann LovelessJennifer M Burnette, PA-C sent at 08/16/2017  9:20 AM EDT ----- Cologuard negative.

## 2017-09-06 ENCOUNTER — Encounter: Payer: Self-pay | Admitting: Physician Assistant

## 2017-09-06 DIAGNOSIS — E119 Type 2 diabetes mellitus without complications: Secondary | ICD-10-CM

## 2017-09-06 MED ORDER — GLIPIZIDE ER 10 MG PO TB24
10.0000 mg | ORAL_TABLET | Freq: Every day | ORAL | 1 refills | Status: DC
Start: 2017-09-06 — End: 2018-02-18

## 2017-09-18 ENCOUNTER — Other Ambulatory Visit: Payer: Self-pay | Admitting: Physician Assistant

## 2017-09-18 DIAGNOSIS — I10 Essential (primary) hypertension: Secondary | ICD-10-CM

## 2017-09-18 DIAGNOSIS — E119 Type 2 diabetes mellitus without complications: Secondary | ICD-10-CM

## 2017-09-20 ENCOUNTER — Other Ambulatory Visit: Payer: Self-pay | Admitting: Physician Assistant

## 2017-09-20 DIAGNOSIS — F329 Major depressive disorder, single episode, unspecified: Secondary | ICD-10-CM

## 2017-09-20 DIAGNOSIS — F32A Depression, unspecified: Secondary | ICD-10-CM

## 2017-09-20 DIAGNOSIS — F419 Anxiety disorder, unspecified: Secondary | ICD-10-CM

## 2017-10-20 ENCOUNTER — Other Ambulatory Visit: Payer: Self-pay | Admitting: Physician Assistant

## 2017-10-20 DIAGNOSIS — E782 Mixed hyperlipidemia: Secondary | ICD-10-CM

## 2017-10-20 DIAGNOSIS — I10 Essential (primary) hypertension: Secondary | ICD-10-CM

## 2017-10-24 ENCOUNTER — Ambulatory Visit: Payer: Self-pay | Admitting: Physician Assistant

## 2017-12-11 ENCOUNTER — Ambulatory Visit: Payer: Self-pay | Admitting: Physician Assistant

## 2018-02-18 ENCOUNTER — Other Ambulatory Visit: Payer: Self-pay | Admitting: Physician Assistant

## 2018-02-18 DIAGNOSIS — E119 Type 2 diabetes mellitus without complications: Secondary | ICD-10-CM

## 2018-03-04 ENCOUNTER — Ambulatory Visit (INDEPENDENT_AMBULATORY_CARE_PROVIDER_SITE_OTHER): Payer: Managed Care, Other (non HMO) | Admitting: Physician Assistant

## 2018-03-04 ENCOUNTER — Encounter: Payer: Self-pay | Admitting: Physician Assistant

## 2018-03-04 VITALS — BP 134/80 | HR 101 | Temp 98.4°F | Resp 16 | Wt 216.4 lb

## 2018-03-04 DIAGNOSIS — E119 Type 2 diabetes mellitus without complications: Secondary | ICD-10-CM

## 2018-03-04 LAB — POCT GLYCOSYLATED HEMOGLOBIN (HGB A1C): Hemoglobin A1C: 7.5 % — AB (ref 4.0–5.6)

## 2018-03-04 MED ORDER — SEMAGLUTIDE(0.25 OR 0.5MG/DOS) 2 MG/1.5ML ~~LOC~~ SOPN: 0.5000 mg | PEN_INJECTOR | SUBCUTANEOUS | 11 refills | Status: DC

## 2018-03-04 MED ORDER — INSULIN PEN NEEDLE 32G X 4 MM MISC: 1 refills | Status: DC

## 2018-03-04 NOTE — Patient Instructions (Addendum)
Ozempic/Semaglutide injection solution What is this medicine? SEMAGLUTIDE (Sem a GLOO tide) is used to improve blood sugar control in adults with type 2 diabetes. This medicine may be used with other diabetes medicines. This medicine may be used for other purposes; ask your health care provider or pharmacist if you have questions. COMMON BRAND NAME(S): OZEMPIC What should I tell my health care provider before I take this medicine? They need to know if you have any of these conditions: -endocrine tumors (MEN 2) or if someone in your family had these tumors -eye disease, vision problems -history of pancreatitis -kidney disease -stomach problems -thyroid cancer or if someone in your family had thyroid cancer -an unusual or allergic reaction to semaglutide, other medicines, foods, dyes, or preservatives -pregnant or trying to get pregnant -breast-feeding How should I use this medicine? This medicine is for injection under the skin of your upper leg (thigh), stomach area, or upper arm. It is given once every week (every 7 days). You will be taught how to prepare and give this medicine. Use exactly as directed. Take your medicine at regular intervals. Do not take it more often than directed. If you use this medicine with insulin, you should inject this medicine and the insulin separately. Do not mix them together. Do not give the injections right next to each other. Change (rotate) injection sites with each injection. It is important that you put your used needles and syringes in a special sharps container. Do not put them in a trash can. If you do not have a sharps container, call your pharmacist or healthcare provider to get one. A special MedGuide will be given to you by the pharmacist with each prescription and refill. Be sure to read this information carefully each time. Talk to your pediatrician regarding the use of this medicine in children. Special care may be needed. Overdosage: If you think  you have taken too much of this medicine contact a poison control center or emergency room at once. NOTE: This medicine is only for you. Do not share this medicine with others. What if I miss a dose? If you miss a dose, take it as soon as you can within 5 days after the missed dose. Then take your next dose at your regular weekly time. If it has been longer than 5 days after the missed dose, do not take the missed dose. Take the next dose at your regular time. Do not take double or extra doses. If you have questions about a missed dose, contact your health care provider for advice. What may interact with this medicine? -other medicines for diabetes Many medications may cause changes in blood sugar, these include: -alcohol containing beverages -antiviral medicines for HIV or AIDS -aspirin and aspirin-like drugs -certain medicines for blood pressure, heart disease, irregular heart beat -chromium -diuretics -female hormones, such as estrogens or progestins, birth control pills -fenofibrate -gemfibrozil -isoniazid -lanreotide -female hormones or anabolic steroids -MAOIs like Carbex, Eldepryl, Marplan, Nardil, and Parnate -medicines for weight loss -medicines for allergies, asthma, cold, or cough -medicines for depression, anxiety, or psychotic disturbances -niacin -nicotine -NSAIDs, medicines for pain and inflammation, like ibuprofen or naproxen -octreotide -pasireotide -pentamidine -phenytoin -probenecid -quinolone antibiotics such as ciprofloxacin, levofloxacin, ofloxacin -some herbal dietary supplements -steroid medicines such as prednisone or cortisone -sulfamethoxazole; trimethoprim -thyroid hormones Some medications can hide the warning symptoms of low blood sugar (hypoglycemia). You may need to monitor your blood sugar more closely if you are taking one of these medications. These include: -  beta-blockers, often used for high blood pressure or heart problems (examples include  atenolol, metoprolol, propranolol) -clonidine -guanethidine -reserpine This list may not describe all possible interactions. Give your health care provider a list of all the medicines, herbs, non-prescription drugs, or dietary supplements you use. Also tell them if you smoke, drink alcohol, or use illegal drugs. Some items may interact with your medicine. What should I watch for while using this medicine? Visit your doctor or health care professional for regular checks on your progress. Drink plenty of fluids while taking this medicine. Check with your doctor or health care professional if you get an attack of severe diarrhea, nausea, and vomiting. The loss of too much body fluid can make it dangerous for you to take this medicine. A test called the HbA1C (A1C) will be monitored. This is a simple blood test. It measures your blood sugar control over the last 2 to 3 months. You will receive this test every 3 to 6 months. Learn how to check your blood sugar. Learn the symptoms of low and high blood sugar and how to manage them. Always carry a quick-source of sugar with you in case you have symptoms of low blood sugar. Examples include hard sugar candy or glucose tablets. Make sure others know that you can choke if you eat or drink when you develop serious symptoms of low blood sugar, such as seizures or unconsciousness. They must get medical help at once. Tell your doctor or health care professional if you have high blood sugar. You might need to change the dose of your medicine. If you are sick or exercising more than usual, you might need to change the dose of your medicine. Do not skip meals. Ask your doctor or health care professional if you should avoid alcohol. Many nonprescription cough and cold products contain sugar or alcohol. These can affect blood sugar. Pens should never be shared. Even if the needle is changed, sharing may result in passing of viruses like hepatitis or HIV. Wear a medical  ID bracelet or chain, and carry a card that describes your disease and details of your medicine and dosage times. What side effects may I notice from receiving this medicine? Side effects that you should report to your doctor or health care professional as soon as possible: -allergic reactions like skin rash, itching or hives, swelling of the face, lips, or tongue -breathing problems -changes in vision -diarrhea that continues or is severe -lump or swelling on the neck -severe nausea -signs and symptoms of infection like fever or chills; cough; sore throat; pain or trouble passing urine -signs and symptoms of low blood sugar such as feeling anxious, confusion, dizziness, increased hunger, unusually weak or tired, sweating, shakiness, cold, irritable, headache, blurred vision, fast heartbeat, loss of consciousness -signs and symptoms of kidney injury like trouble passing urine or change in the amount of urine -trouble swallowing -unusual stomach upset or pain -vomiting Side effects that usually do not require medical attention (report to your doctor or health care professional if they continue or are bothersome): -constipation -diarrhea -nausea -pain, redness, or irritation at site where injected -stomach upset This list may not describe all possible side effects. Call your doctor for medical advice about side effects. You may report side effects to FDA at 1-800-FDA-1088. Where should I keep my medicine? Keep out of the reach of children. Store unopened pens in a refrigerator between 2 and 8 degrees C (36 and 46 degrees F). Do not freeze. Protect from  to your doctor or health care professional if they continue or are bothersome):  -constipation  -diarrhea  -nausea  -pain, redness, or irritation at site where injected  -stomach upset  This list may not describe all possible side effects. Call your doctor for medical advice about side effects. You may report side effects to FDA at 1-800-FDA-1088.  Where should I keep my medicine?  Keep out of the reach of children.  Store unopened pens in a refrigerator between 2 and 8 degrees C (36 and 46 degrees F). Do not freeze. Protect from light and heat. After you first use the pen, it can be stored for 56 days at room temperature between 15 and 30 degrees C (59 and 86 degrees F) or in a refrigerator. Throw away  your used pen after 56 days or after the expiration date, whichever comes first.  Do not store your pen with the needle attached. If the needle is left on, medicine may leak from the pen.  NOTE: This sheet is a summary. It may not cover all possible information. If you have questions about this medicine, talk to your doctor, pharmacist, or health care provider.  © 2018 [employer]/Gold Standard (2016-05-24 14:43:35)

## 2018-03-04 NOTE — Progress Notes (Signed)
Patient: Dana Wallace Female    DOB: Jan 12, 1956   62 y.o.   MRN: [id number] Visit Date: 03/04/2018  Today's Provider: Margaretann Loveless, PA-C   Chief Complaint  Patient presents with  . Diabetes  . Hypertension   Subjective:    HPI      Diabetes Mellitus Type II, Follow-up:   Lab Results  Component Value Date   HGBA1C 7.5 (A) 03/04/2018   HGBA1C 8.2 07/24/2017   HGBA1C 7.3 09/12/2016    Last seen for diabetes 6 months ago.  Management since then includes none. She reports excellent compliance with treatment. She is having side effects which she states if from Metformin which has caused G.I upset, otherwise well tolerated.  Current symptoms include visual disturbances , patient states that she has a eye exam scheduled in a week to address and have been worsening. Home blood sugar records: 136 average  Episodes of hypoglycemia? no   Current Insulin Regimen: none Most Recent Eye Exam: not done, patient agrees to schedule. Weight trend: increasing steadily Prior visit with dietician: no Current diet: in general, a "healthy" diet  , but patient states that she has been eating more carbs than usual.  Current exercise: none  Pertinent Labs:    Component Value Date/Time   CHOL 136 07/26/2017 0812   TRIG 261 (H) 07/26/2017 0812   HDL 37 (L) 07/26/2017 0812   LDLCALC 47 07/26/2017 0812   CREATININE 0.58 07/26/2017 0812    Wt Readings from Last 3 Encounters:  03/04/18 216 lb 6.4 oz (98.2 kg)  07/24/17 211 lb (95.7 kg)  09/12/16 212 lb (96.2 kg)      Hypertension, follow-up:  BP Readings from Last 3 Encounters:  03/04/18 134/80  07/24/17 138/80  09/12/16 118/72    She was last seen for hypertension 6 months ago.  BP at that visit was 138/80. Management changes since that visit include none. She reports excellent compliance with treatment. She is not having side effects.  She is not exercising. She is not adherent to low salt diet.     Outside blood pressures are . She is experiencing none.  Patient denies chest pain, chest pressure/discomfort, claudication, exertional chest pressure/discomfort, fatigue, irregular heart beat, lower extremity edema, near-syncope, orthopnea, palpitations, paroxysmal nocturnal dyspnea, syncope and tachypnea.   Cardiovascular risk factors include advanced age (older than 68 for men, 41 for women), diabetes mellitus and obesity (BMI >= 30 kg/m2).  Use of agents associated with hypertension: NSAIDS.     Weight trend: increasing steadily Wt Readings from Last 3 Encounters:  03/04/18 216 lb 6.4 oz (98.2 kg)  07/24/17 211 lb (95.7 kg)  09/12/16 212 lb (96.2 kg)    Current diet: in general, a "healthy" diet    ------------------------------------------------------------------------  ------------------------------------------------------------------------   No Known Allergies   Current Outpatient Medications:  .  ALPRAZolam (XANAX) 0.5 MG tablet, TAKE 1/2 TO 1 TABLET BY MOUTH TWICE DAILY AS NEEDED, Disp: 45 tablet, Rfl: 5 .  aspirin 81 MG tablet, Take by mouth., Disp: , Rfl:  .  glipiZIDE (GLUCOTROL XL) 10 MG 24 hr tablet, TAKE 1 TABLET(10 MG) BY MOUTH DAILY WITH BREAKFAST, Disp: 90 tablet, Rfl: 1 .  glucose blood (ONE TOUCH ULTRA TEST) test strip, Check blood sugar once daily, Disp: 100 each, Rfl: 12 .  hydrochlorothiazide (MICROZIDE) 12.5 MG capsule, TAKE 1 CAPSULE BY MOUTH DAILY, Disp: 90 capsule, Rfl: 1 .  ibuprofen (ADVIL,MOTRIN) 200 MG tablet, , Disp: ,  Rfl:  .  metFORMIN (GLUCOPHAGE) 1000 MG tablet, TAKE 1 TABLET(1000 MG) BY MOUTH TWICE DAILY WITH A MEAL, Disp: 180 tablet, Rfl: 1 .  metoprolol tartrate (LOPRESSOR) 25 MG tablet, TAKE 1 TABLET(25 MG) BY MOUTH TWICE DAILY, Disp: 180 tablet, Rfl: 1 .  mometasone (ELOCON) 0.1 % cream, , Disp: , Rfl:  .  Multiple Vitamin tablet, , Disp: , Rfl:  .  Omega-3 Fatty Acids (FISH OIL) 1200 MG CPDR, Take by mouth daily., Disp: , Rfl:  .   simvastatin (ZOCOR) 10 MG tablet, TAKE 1 TABLET(10 MG) BY MOUTH AT BEDTIME, Disp: 90 tablet, Rfl: 1  Review of Systems  Constitutional: Negative.   Respiratory: Negative.   Cardiovascular: Negative.   Gastrointestinal: Negative.   Endocrine: Negative.   Neurological: Negative.   Psychiatric/Behavioral: Negative.     Social History   Tobacco Use  . Smoking status: Never Smoker  . Smokeless tobacco: Never Used  Substance Use Topics  . Alcohol use: No   Objective:   BP 134/80   Pulse (!) 101   Temp 98.4 F (36.9 C) (Oral)   Resp 16   Wt 216 lb 6.4 oz (98.2 kg)   SpO2 98%   BMI 40.56 kg/m  Vitals:   03/04/18 1623  BP: 134/80  Pulse: (!) 101  Resp: 16  Temp: 98.4 F (36.9 C)  TempSrc: Oral  SpO2: 98%  Weight: 216 lb 6.4 oz (98.2 kg)     Physical Exam  Constitutional: She appears well-developed and well-nourished. No distress.  Neck: Normal range of motion. Neck supple.  Cardiovascular: Normal rate, regular rhythm and normal heart sounds. Exam reveals no gallop and no friction rub.  No murmur heard. Pulmonary/Chest: Effort normal and breath sounds normal. No respiratory distress. She has no wheezes. She has no rales.  Skin: She is not diaphoretic.  Vitals reviewed.  Diabetic Foot Exam - Simple   Simple Foot Form Diabetic Foot exam was performed with the following findings:  Yes 03/04/2018  5:11 PM  Visual Inspection No deformities, no ulcerations, no other skin breakdown bilaterally:  Yes Sensation Testing Intact to touch and monofilament testing bilaterally:  Yes Pulse Check Posterior Tibialis and Dorsalis pulse intact bilaterally:  Yes Comments       Assessment & Plan:     1. Type 2 diabetes mellitus without complication, without long-term current use of insulin (HCC) A1c down to 7.5 from 8.2. Will stop metformin due to GI side effects per patient. Continue Glipizide XR 10mg . Add Ozempic 0.5mg  SQ once weekly as below. I will see her back in 3 months for  recheck of A1c.  - POCT glycosylated hemoglobin (Hb A1C) - Semaglutide,0.25 or 0.5MG /DOS, (OZEMPIC, 0.25 OR 0.5 MG/DOSE,) 2 MG/1.5ML SOPN; Inject 0.5 mg into the skin once a week.  Dispense: 1 pen; Refill: 11 - Insulin Pen Needle (NOVOFINE PLUS) 32G X 4 MM MISC; To use once weekly with Ozempic injection  Dispense: 100 each; Refill: 1       Margaretann Loveless, PA-C  [facility], [facility]

## 2018-03-06 ENCOUNTER — Telehealth: Payer: Self-pay

## 2018-03-06 NOTE — Telephone Encounter (Signed)
LM for patient to call back and schedule a 3 month follow-up T2DM with Antony Contras please.

## 2018-03-08 ENCOUNTER — Encounter: Payer: Self-pay | Admitting: Physician Assistant

## 2018-03-11 NOTE — Telephone Encounter (Signed)
Left vm again to have pt call and schedule a 3 month fup appointment.  dbs,cma

## 2018-03-19 ENCOUNTER — Other Ambulatory Visit: Payer: Self-pay | Admitting: Physician Assistant

## 2018-03-19 DIAGNOSIS — E119 Type 2 diabetes mellitus without complications: Secondary | ICD-10-CM

## 2018-03-19 DIAGNOSIS — I10 Essential (primary) hypertension: Secondary | ICD-10-CM

## 2018-03-19 LAB — HM DIABETES EYE EXAM

## 2018-03-20 NOTE — Telephone Encounter (Signed)
Patient only needs refill on the Metoprolol.  Metformin Antony Contras was ok with patient to stop her Metformin on 10/19 since it was causing GI side effects.

## 2018-03-25 ENCOUNTER — Encounter: Payer: Self-pay | Admitting: Physician Assistant

## 2018-03-31 ENCOUNTER — Other Ambulatory Visit: Payer: Self-pay | Admitting: Physician Assistant

## 2018-03-31 DIAGNOSIS — F329 Major depressive disorder, single episode, unspecified: Secondary | ICD-10-CM

## 2018-03-31 DIAGNOSIS — F419 Anxiety disorder, unspecified: Secondary | ICD-10-CM

## 2018-03-31 DIAGNOSIS — F32A Depression, unspecified: Secondary | ICD-10-CM

## 2018-04-28 ENCOUNTER — Ambulatory Visit (INDEPENDENT_AMBULATORY_CARE_PROVIDER_SITE_OTHER): Payer: Managed Care, Other (non HMO) | Admitting: Physician Assistant

## 2018-04-28 ENCOUNTER — Encounter: Payer: Self-pay | Admitting: Physician Assistant

## 2018-04-28 VITALS — BP 131/82 | HR 96 | Temp 98.1°F | Wt 212.9 lb

## 2018-04-28 DIAGNOSIS — E119 Type 2 diabetes mellitus without complications: Secondary | ICD-10-CM | POA: Diagnosis not present

## 2018-04-28 LAB — POCT GLYCOSYLATED HEMOGLOBIN (HGB A1C)
Est. average glucose Bld gHb Est-mCnc: 140
HEMOGLOBIN A1C: 6.5 % — AB (ref 4.0–5.6)

## 2018-04-28 MED ORDER — DAPAGLIFLOZIN PROPANEDIOL 5 MG PO TABS: 5.0000 mg | ORAL_TABLET | Freq: Every day | ORAL | 1 refills | Status: DC

## 2018-04-28 MED ORDER — ACCU-CHEK AVIVA PLUS W/DEVICE KIT: PACK | 0 refills | Status: DC

## 2018-04-28 MED ORDER — ACCU-CHEK MULTICLIX LANCETS MISC: 12 refills | Status: DC

## 2018-04-28 MED ORDER — GLUCOSE BLOOD VI STRP: ORAL_STRIP | 12 refills | Status: DC

## 2018-04-28 NOTE — Progress Notes (Signed)
Patient: Dana Wallace Female    DOB: [birth year]   62 y.o.   MRN: [id number] Visit Date: 04/28/2018  Today's Provider: Mar Daring, PA-C   Chief Complaint  Patient presents with  . Diabetes   Subjective:    HPI  Diabetes Mellitus Type II, Follow-up:   Lab Results  Component Value Date   HGBA1C 6.5 (A) 04/28/2018   HGBA1C 7.5 (A) 03/04/2018   HGBA1C 8.2 07/24/2017    Last seen for diabetes 2 months ago.  Management since then includes Stop Metformin and added Ozempic 0.5 mg SQ weekly. She reports good compliance with treatment. She is not having side effects.  Current symptoms include none and have been stable. Home blood sugar records: none  Episodes of hypoglycemia? no   Current Insulin Regimen: yes Most Recent Eye Exam: 03/2018 Weight trend: fluctuating a bit Prior visit with dietician: no Current diet: well balanced Current exercise: none  Pertinent Labs:    Component Value Date/Time   CHOL 136 07/26/2017 0812   TRIG 261 (H) 07/26/2017 0812   HDL 37 (L) 07/26/2017 0812   LDLCALC 47 07/26/2017 0812   CREATININE 0.58 07/26/2017 0812    Wt Readings from Last 3 Encounters:  04/28/18 212 lb 14.4 oz (96.6 kg)  03/04/18 216 lb 6.4 oz (98.2 kg)  07/24/17 211 lb (95.7 kg)   ------------------------------------------------------------------------     No Known Allergies   Current Outpatient Medications:  .  ALPRAZolam (XANAX) 0.5 MG tablet, TAKE 1/2 TO 1 TABLET BY MOUTH TWICE DAILY AS NEEDED, Disp: 45 tablet, Rfl: 5 .  aspirin 81 MG tablet, Take by mouth., Disp: , Rfl:  .  glipiZIDE (GLUCOTROL XL) 10 MG 24 hr tablet, TAKE 1 TABLET(10 MG) BY MOUTH DAILY WITH BREAKFAST, Disp: 90 tablet, Rfl: 1 .  hydrochlorothiazide (MICROZIDE) 12.5 MG capsule, TAKE 1 CAPSULE BY MOUTH DAILY, Disp: 90 capsule, Rfl: 1 .  ibuprofen (ADVIL,MOTRIN) 200 MG tablet, , Disp: , Rfl:  .  metoprolol tartrate (LOPRESSOR) 25 MG tablet, TAKE 1 TABLET(25 MG) BY MOUTH TWICE  DAILY, Disp: 180 tablet, Rfl: 0 .  mometasone (ELOCON) 0.1 % cream, , Disp: , Rfl:  .  Multiple Vitamin tablet, , Disp: , Rfl:  .  Omega-3 Fatty Acids (FISH OIL) 1200 MG CPDR, Take by mouth daily., Disp: , Rfl:  .  simvastatin (ZOCOR) 10 MG tablet, TAKE 1 TABLET(10 MG) BY MOUTH AT BEDTIME, Disp: 90 tablet, Rfl: 1 .  Blood Glucose Monitoring Suppl (ACCU-CHEK AVIVA PLUS) w/Device KIT, To check blood glucose once daily, Disp: 1 kit, Rfl: 0 .  dapagliflozin propanediol (FARXIGA) 5 MG TABS tablet, Take 5 mg by mouth daily., Disp: 90 tablet, Rfl: 1 .  glucose blood (ACCU-CHEK AVIVA PLUS) test strip, To check blood glucose once daily, Disp: 100 each, Rfl: 12 .  Lancets (ACCU-CHEK MULTICLIX) lancets, To check blood glucose once daily, Disp: 100 each, Rfl: 12  Review of Systems  Constitutional: Negative.   HENT: Negative.   Respiratory: Negative.   Gastrointestinal: Negative.   Neurological: Negative.     Social History   Tobacco Use  . Smoking status: Never Smoker  . Smokeless tobacco: Never Used  Substance Use Topics  . Alcohol use: No   Objective:   BP 131/82 (BP Location: Left Arm, Patient Position: Sitting, Cuff Size: Normal)   Pulse 96   Temp 98.1 F (36.7 C) (Oral)   Wt 212 lb 14.4 oz (96.6 kg)   SpO2 99%  BMI 39.90 kg/m  Vitals:   04/28/18 1611  BP: 131/82  Pulse: 96  Temp: 98.1 F (36.7 C)  TempSrc: Oral  SpO2: 99%  Weight: 212 lb 14.4 oz (96.6 kg)     Physical Exam  Constitutional: She appears well-developed and well-nourished. No distress.  Neck: Normal range of motion. Neck supple.  Cardiovascular: Normal rate, regular rhythm and normal heart sounds. Exam reveals no gallop and no friction rub.  No murmur heard. Pulmonary/Chest: Effort normal and breath sounds normal. No respiratory distress. She has no wheezes. She has no rales.  Skin: She is not diaphoretic.  Vitals reviewed.       Assessment & Plan:     1. Type 2 diabetes mellitus without  complication, without long-term current use of insulin (HCC) A1c down to 6.5. Tolerating ozempic well but patient is unable to afford medication. Will stop ozempic and change to [place] as below. I will see her back in 3 months to recheck.  - POCT glycosylated hemoglobin (Hb A1C) - Blood Glucose Monitoring Suppl (ACCU-CHEK AVIVA PLUS) w/Device KIT; To check blood glucose once daily  Dispense: 1 kit; Refill: 0 - glucose blood (ACCU-CHEK AVIVA PLUS) test strip; To check blood glucose once daily  Dispense: 100 each; Refill: 12 - Lancets (ACCU-CHEK MULTICLIX) lancets; To check blood glucose once daily  Dispense: 100 each; Refill: 12 - dapagliflozin propanediol (FARXIGA) 5 MG TABS tablet; Take 5 mg by mouth daily.  Dispense: 90 tablet; Refill: [place], PA-C  [facility]

## 2018-04-28 NOTE — Patient Instructions (Signed)
Dapagliflozin tablets  What is this medicine?  DAPAGLIFLOZIN (DAP a gli FLOE zin) helps to treat type 2 diabetes. It helps to control blood sugar. Treatment is combined with diet and exercise.  This medicine may be used for other purposes; ask your health care provider or pharmacist if you have questions.  COMMON BRAND NAME(S): Farxiga  What should I tell my health care provider before I take this medicine?  They need to know if you have any of these conditions:  -bladder cancer  -dehydration  -diabetic ketoacidosis  -diet low in salt  -eating less due to illness, surgery, dieting, or any other reason  -having surgery  -high cholesterol  -history of pancreatitis or pancreas problems  -history of yeast infection of the penis or vagina  -if you often drink alcohol  -infections in the bladder, kidneys, or urinary tract  -kidney disease  -low blood pressure  -on hemodialysis  -problems urinating  -type 1 diabetes  -uncircumcised female  -an unusual or allergic reaction to dapagliflozin, other medicines, foods, dyes, or preservatives  -pregnant or trying to get pregnant  -breast-feeding  How should I use this medicine?  Take this medicine by mouth with a glass of water. Follow the directions on the prescription label. You can take it with or without food. If it upsets your stomach, take it with food. Take this medicine in the morning. Take your dose at the same time each day. Do not take more often than directed. Do not stop taking except on your doctor's advice.  A special MedGuide will be given to you by the pharmacist with each prescription and refill. Be sure to read this information carefully each time.  Talk to your pediatrician regarding the use of this medicine in children. Special care may be needed.  Overdosage: If you think you have taken too much of this medicine contact a poison control center or emergency room at once.  NOTE: This medicine is only for you. Do not share this medicine with others.  What if I  miss a dose?  If you miss a dose, take it as soon as you can. If it is almost time for your next dose, take only that dose. Do not take double or extra doses.  What may interact with this medicine?  Do not take this medicine with any of the following medications:  -gatifloxacin  This medicine may also interact with the following medications:  -alcohol  -certain medicines for blood pressure, heart disease  -diuretics  -insulin  -nateglinide  -pioglitazone  -quinolone antibiotics like ciprofloxacin, levofloxacin, ofloxacin  -repaglinide  -some herbal dietary supplements  -steroid medicines like prednisone or cortisone  -sulfonylureas like glimepiride, glipizide, glyburide  -thyroid medicine  This list may not describe all possible interactions. Give your health care provider a list of all the medicines, herbs, non-prescription drugs, or dietary supplements you use. Also tell them if you smoke, drink alcohol, or use illegal drugs. Some items may interact with your medicine.  What should I watch for while using this medicine?  Visit your doctor or health care professional for regular checks on your progress.  This medicine can cause a serious condition in which there is too much acid in the blood. If you develop nausea, vomiting, stomach pain, unusual tiredness, or breathing problems, stop taking this medicine and call your doctor right away. If possible, use a ketone dipstick to check for ketones in your urine.  A test called the HbA1C (A1C) will be monitored. This   with you in case you have symptoms of low blood sugar. Examples include hard sugar candy or glucose tablets. Make sure others know that you can choke if you eat or drink when  you develop serious symptoms of low blood sugar, such as seizures or unconsciousness. They must get medical help at once. Tell your doctor or health care professional if you have high blood sugar. You might need to change the dose of your medicine. If you are sick or exercising more than usual, you might need to change the dose of your medicine. Do not skip meals. Ask your doctor or health care professional if you should avoid alcohol. Many nonprescription cough and cold products contain sugar or alcohol. These can affect blood sugar. Wear a medical ID bracelet or chain, and carry a card that describes your disease and details of your medicine and dosage times. What side effects may I notice from receiving this medicine? Side effects that you should report to your doctor or health care professional as soon as possible: -allergic reactions like skin rash, itching or hives, swelling of the face, lips, or tongue -breathing problems -dizziness -feeling faint or lightheaded, falls -muscle weakness -nausea, vomiting, unusual stomach upset or pain -signs and symptoms of low blood sugar such as feeling anxious, confusion, dizziness, increased hunger, unusually weak or tired, sweating, shakiness, cold, irritable, headache, blurred vision, fast heartbeat, loss of consciousness -signs and symptoms of a urinary tract infection, such as fever, chills, a burning feeling when urinating, blood in the urine, back pain -trouble passing urine or change in the amount of urine, including an urgent need to urinate more often, in larger amounts, or at night -penile discharge, itching, or pain in men -unusual tiredness -vaginal discharge, itching, or odor in women Side effects that usually do not require medical attention (report to your doctor or health care professional if they continue or are bothersome): -constipation -mild increase in urination -stuffy or runny nose -sore throat -thirsty This list may not  describe all possible side effects. Call your doctor for medical advice about side effects. You may report side effects to FDA at 1-800-FDA-1088. Where should I keep my medicine? Keep out of the reach of children. Store at room temperature between 15 and 30 degrees C (59 and 86 degrees F). Throw away any unused medicine after the expiration date. NOTE: This sheet is a summary. It may not cover all possible information. If you have questions about this medicine, talk to your doctor, pharmacist, or health care provider.  2018 Elsevier/Gold Standard (2015-06-09 08:46:40)

## 2018-05-19 ENCOUNTER — Other Ambulatory Visit: Payer: Self-pay | Admitting: Physician Assistant

## 2018-05-19 DIAGNOSIS — I10 Essential (primary) hypertension: Secondary | ICD-10-CM

## 2018-05-19 DIAGNOSIS — E782 Mixed hyperlipidemia: Secondary | ICD-10-CM

## 2018-06-20 ENCOUNTER — Other Ambulatory Visit: Payer: Self-pay | Admitting: Physician Assistant

## 2018-06-20 DIAGNOSIS — I10 Essential (primary) hypertension: Secondary | ICD-10-CM

## 2018-08-13 ENCOUNTER — Ambulatory Visit: Payer: Managed Care, Other (non HMO) | Admitting: Physician Assistant

## 2018-08-19 ENCOUNTER — Other Ambulatory Visit: Payer: Self-pay | Admitting: Physician Assistant

## 2018-08-19 ENCOUNTER — Other Ambulatory Visit: Payer: Self-pay | Admitting: Family Medicine

## 2018-08-19 DIAGNOSIS — E782 Mixed hyperlipidemia: Secondary | ICD-10-CM

## 2018-08-19 DIAGNOSIS — E119 Type 2 diabetes mellitus without complications: Secondary | ICD-10-CM

## 2018-08-19 DIAGNOSIS — I10 Essential (primary) hypertension: Secondary | ICD-10-CM

## 2018-08-19 NOTE — Telephone Encounter (Signed)
Please Review

## 2018-08-20 MED ORDER — HYDROCHLOROTHIAZIDE 12.5 MG PO CAPS: 12.5000 mg | ORAL_CAPSULE | Freq: Every day | ORAL | 0 refills | Status: DC

## 2018-09-30 ENCOUNTER — Other Ambulatory Visit: Payer: Self-pay | Admitting: Physician Assistant

## 2018-09-30 DIAGNOSIS — F32A Depression, unspecified: Secondary | ICD-10-CM

## 2018-09-30 DIAGNOSIS — F419 Anxiety disorder, unspecified: Secondary | ICD-10-CM

## 2018-09-30 DIAGNOSIS — F329 Major depressive disorder, single episode, unspecified: Secondary | ICD-10-CM

## 2018-10-16 ENCOUNTER — Other Ambulatory Visit: Payer: Self-pay | Admitting: Physician Assistant

## 2018-10-16 DIAGNOSIS — E119 Type 2 diabetes mellitus without complications: Secondary | ICD-10-CM

## 2018-11-14 ENCOUNTER — Other Ambulatory Visit: Payer: Self-pay | Admitting: Physician Assistant

## 2018-11-14 DIAGNOSIS — I10 Essential (primary) hypertension: Secondary | ICD-10-CM

## 2018-11-14 DIAGNOSIS — E782 Mixed hyperlipidemia: Secondary | ICD-10-CM

## 2018-11-14 MED ORDER — SIMVASTATIN 10 MG PO TABS: 10.0000 mg | ORAL_TABLET | Freq: Every day | ORAL | 1 refills | Status: DC

## 2018-11-14 MED ORDER — HYDROCHLOROTHIAZIDE 12.5 MG PO CAPS: 12.5000 mg | ORAL_CAPSULE | Freq: Every day | ORAL | 1 refills | Status: DC

## 2018-11-14 NOTE — Telephone Encounter (Deleted)
e

## 2018-11-14 NOTE — Telephone Encounter (Addendum)
[place] faxed refill request for the following medications:  hydrochlorothiazide (MICROZIDE) 12.5 MG capsule  simvastatin (ZOCOR) 10 MG tablet   Please advise.

## 2018-11-29 ENCOUNTER — Encounter: Payer: Self-pay | Admitting: Physician Assistant

## 2018-11-29 DIAGNOSIS — E119 Type 2 diabetes mellitus without complications: Secondary | ICD-10-CM

## 2018-12-01 MED ORDER — OZEMPIC (0.25 OR 0.5 MG/DOSE) 2 MG/1.5ML ~~LOC~~ SOPN: 0.5000 mg | PEN_INJECTOR | SUBCUTANEOUS | 5 refills | Status: DC

## 2018-12-12 ENCOUNTER — Other Ambulatory Visit: Payer: Self-pay | Admitting: Physician Assistant

## 2018-12-12 DIAGNOSIS — I10 Essential (primary) hypertension: Secondary | ICD-10-CM

## 2019-02-09 ENCOUNTER — Other Ambulatory Visit: Payer: Self-pay | Admitting: Physician Assistant

## 2019-02-09 DIAGNOSIS — E119 Type 2 diabetes mellitus without complications: Secondary | ICD-10-CM

## 2019-04-27 ENCOUNTER — Encounter: Payer: Self-pay | Admitting: Physician Assistant

## 2019-04-27 DIAGNOSIS — L299 Pruritus, unspecified: Secondary | ICD-10-CM

## 2019-04-28 MED ORDER — FLUOCINOLONE ACETONIDE 0.01 % OT OIL: 1.0000 [drp] | TOPICAL_OIL | Freq: Two times a day (BID) | OTIC | 0 refills | Status: DC | PRN

## 2019-04-29 ENCOUNTER — Encounter: Payer: Self-pay | Admitting: Physician Assistant

## 2019-05-11 ENCOUNTER — Other Ambulatory Visit: Payer: Self-pay | Admitting: Physician Assistant

## 2019-05-11 DIAGNOSIS — I10 Essential (primary) hypertension: Secondary | ICD-10-CM

## 2019-05-11 DIAGNOSIS — E782 Mixed hyperlipidemia: Secondary | ICD-10-CM

## 2019-05-11 NOTE — Telephone Encounter (Signed)
Requested medication (s) are due for refill today: yes  Requested medication (s) are on the active medication list: yes  Last refill: 11/22/202  Future visit scheduled: no  Notes to clinic:  no valid encounter within last 6 months Due for labs   Requested Prescriptions  Pending Prescriptions Disp Refills   hydrochlorothiazide (MICROZIDE) 12.5 MG capsule [Pharmacy Med Name: HYDROCHLOROTHIAZIDE 12.5MG  CAPSULES] 90 capsule 1    Sig: TAKE 1 CAPSULE(12.5 MG) BY MOUTH DAILY      Cardiovascular: Diuretics - Thiazide Failed - 05/11/2019  3:16 AM      Failed - Ca in normal range and within 360 days    Calcium  Date Value Ref Range Status  07/26/2017 9.5 8.7 - 10.2 mg/dL Final          Failed - Cr in normal range and within 360 days    Creatinine, Ser  Date Value Ref Range Status  07/26/2017 0.58 0.57 - 1.00 mg/dL Final          Failed - K in normal range and within 360 days    Potassium  Date Value Ref Range Status  07/26/2017 4.0 3.5 - 5.2 mmol/L Final          Failed - Na in normal range and within 360 days    Sodium  Date Value Ref Range Status  07/26/2017 138 134 - 144 mmol/L Final          Failed - Valid encounter within last 6 months    Recent Outpatient Visits           1 year ago Type 2 diabetes mellitus without complication, without long-term current use of insulin (HCC)   [facility], [place], PA-C   1 year ago Type 2 diabetes mellitus without complication, without long-term current use of insulin (HCC)   [facility] [place], [place], [place]   1 year ago Essential (primary) hypertension   [employer], [place], PA-C   2 years ago Essential (primary) hypertension   [employer], [place], PA-C   3 years ago Type 2 diabetes mellitus without complication, without long-term current use of insulin (HCC)   [facility], [place] Dike M, [place]               Passed - Last BP in normal range    BP Readings from Last 1 Encounters:  04/28/18 131/82            simvastatin (ZOCOR) 10 MG tablet [Pharmacy Med Name: SIMVASTATIN 10MG  TABLETS] 90 tablet 1    Sig: TAKE 1 TABLET(10 MG) BY MOUTH AT BEDTIME      Cardiovascular:  Antilipid - Statins Failed - 05/11/2019  3:16 AM      Failed - Total Cholesterol in normal range and within 360 days    Cholesterol, Total  Date Value Ref Range Status  07/26/2017 136 100 - 199 mg/dL Final          Failed - LDL in normal range and within 360 days    LDL Calculated  Date Value Ref Range Status  07/26/2017 47 0 - 99 mg/dL Final          Failed - HDL in normal range and within 360 days    HDL  Date Value Ref Range Status  07/26/2017 37 (L) >39 mg/dL Final          Failed - Triglycerides in normal range and within 360 days  Triglycerides  Date Value Ref Range Status  07/26/2017 261 (H) 0 - 149 mg/dL Final          Failed - Valid encounter within last 12 months    Recent Outpatient Visits           1 year ago Type 2 diabetes mellitus without complication, without long-term current use of insulin [facility])   [facility], [place], [place]   1 year ago Type 2 diabetes mellitus without complication, without long-term current use of insulin [facility])   [place], [place], [place]   1 year ago Essential (primary) hypertension   [employer], [place], [place]   2 years ago Essential (primary) hypertension   [place], [place], PA-C   3 years ago Type 2 diabetes mellitus without complication, without long-term current use of insulin [facility])   [facility], [place], [place]              Passed - Patient is not pregnant

## 2019-06-08 ENCOUNTER — Other Ambulatory Visit: Payer: Self-pay | Admitting: Physician Assistant

## 2019-06-08 DIAGNOSIS — I10 Essential (primary) hypertension: Secondary | ICD-10-CM

## 2019-06-10 ENCOUNTER — Encounter: Payer: Self-pay | Admitting: Physician Assistant

## 2019-06-10 DIAGNOSIS — I10 Essential (primary) hypertension: Secondary | ICD-10-CM

## 2019-06-11 MED ORDER — METOPROLOL TARTRATE 25 MG PO TABS: ORAL_TABLET | ORAL | 0 refills | Status: DC

## 2019-08-06 ENCOUNTER — Other Ambulatory Visit: Payer: Self-pay | Admitting: Physician Assistant

## 2019-08-06 DIAGNOSIS — E119 Type 2 diabetes mellitus without complications: Secondary | ICD-10-CM

## 2019-08-06 DIAGNOSIS — Z1231 Encounter for screening mammogram for malignant neoplasm of breast: Secondary | ICD-10-CM

## 2019-08-06 NOTE — Telephone Encounter (Signed)
LOV 04/28/18 LRF 02/09/19  Patient was told on 1/21 she needed to make an appointment when she was given a refill on something else.  She has not made one yet.    Have left a message for patient to call office.  She will need to have an appointment scheduled before we give her another Rx.  Will refill enough to last until her appointment time.   LMTCB, [facility] Triage Nurse may give patient results

## 2019-08-06 NOTE — Telephone Encounter (Signed)
Requested  medications are  due for refill today yes  Requested medications are on the active medication list yes  Last refill 2/15  Future visit scheduled no  Notes to clinic: protocol failed for office visit within 6 mths and lab work.

## 2019-08-06 NOTE — Progress Notes (Signed)
Patient: Dana Wallace, Female    DOB: [birth year], 64 y.o.   MRN: [id number] Visit Date: 08/07/2019  Today's Provider: Mar Daring, PA-C   Chief Complaint  Patient presents with  . Annual Exam   Subjective:     Annual physical exam Dana Wallace is a 64 y.o. female who presents today for health maintenance and complete physical. She feels well. She reports exercising none. She reports she is sleeping fairly well.  ----------------------------------------------------------------- 08/16/17-Cologuard negative 08/10/2019-Mammogram Scheduled  Review of Systems  Constitutional: Negative.   HENT: Negative.   Eyes: Negative.   Respiratory: Negative.   Cardiovascular: Negative.   Gastrointestinal: Negative.   Endocrine: Negative.   Genitourinary: Negative.   Musculoskeletal: Negative.   Skin: Negative.   Allergic/Immunologic: Negative.   Neurological: Negative.   Hematological: Negative.   Psychiatric/Behavioral: Negative.     Social History      She  reports that she has never smoked. She has never used smokeless tobacco. She reports that she does not drink alcohol or use drugs.       Social History   Socioeconomic History  . Marital status: Married    Spouse name: Not on file  . Number of children: Not on file  . Years of education: Not on file  . Highest education level: Not on file  Occupational History  . Not on file  Tobacco Use  . Smoking status: Never Smoker  . Smokeless tobacco: Never Used  Substance and Sexual Activity  . Alcohol use: No  . Drug use: No  . Sexual activity: Not on file  Other Topics Concern  . Not on file  Social History Narrative  . Not on file   Social Determinants of Health   Financial Resource Strain:   . Difficulty of Paying Living Expenses:   Food Insecurity:   . Worried About Charity fundraiser in the Last Year:   . Arboriculturist in the Last Year:   Transportation Needs:   . Film/video editor  (Medical):   Marland Kitchen Lack of Transportation (Non-Medical):   Physical Activity:   . Days of Exercise per Week:   . Minutes of Exercise per Session:   Stress:   . Feeling of Stress :   Social Connections:   . Frequency of Communication with Friends and Family:   . Frequency of Social Gatherings with Friends and Family:   . Attends Religious Services:   . Active Member of Clubs or Organizations:   . Attends Archivist Meetings:   Marland Kitchen Marital Status:     History reviewed. No pertinent past medical history.   Patient Active Problem List   Diagnosis Date Noted  . Hypercholesterolemia with hypertriglyceridemia 11/07/2015  . Adult BMI 30+ 10/05/2015  . Clinical depression 10/05/2015  . Abnormal liver enzymes 10/05/2015  . Diabetes mellitus, type 2 ([place]) 10/05/2015  . Essential (primary) hypertension 04/26/2015  . Anxiety 11/08/2014    Past Surgical History:  Procedure Laterality Date  . CESAREAN SECTION    . CHOLECYSTECTOMY      Family History        Family Status  Relation Name Status  . Mother  Deceased  . Sister  Deceased  . Brother  Alive        Her family history includes COPD in her mother; Hodgkin's lymphoma in her sister; Multiple sclerosis in her mother.      No Known Allergies   Current Outpatient  Medications:  .  ALPRAZolam (XANAX) 0.5 MG tablet, TAKE 1/2 TO 1 TABLET BY MOUTH TWICE DAILY AS NEEDED, Disp: 45 tablet, Rfl: 5 .  aspirin 81 MG tablet, Take by mouth., Disp: , Rfl:  .  Blood Glucose Monitoring Suppl (ACCU-CHEK AVIVA PLUS) w/Device KIT, To check blood glucose once daily, Disp: 1 kit, Rfl: 0 .  FARXIGA 5 MG TABS tablet, TAKE 1 TABLET BY MOUTH DAILY, Disp: 90 tablet, Rfl: 1 .  Fluocinolone Acetonide 0.01 % OIL, Place 1 drop in ear(s) 2 (two) times daily as needed., Disp: 20 mL, Rfl: 0 .  glipiZIDE (GLUCOTROL XL) 10 MG 24 hr tablet, TAKE 1 TABLET(10 MG) BY MOUTH DAILY WITH BREAKFAST, Disp: 90 tablet, Rfl: 1 .  glucose blood (ACCU-CHEK AVIVA PLUS)  test strip, To check blood glucose once daily, Disp: 100 each, Rfl: 12 .  hydrochlorothiazide (MICROZIDE) 12.5 MG capsule, TAKE 1 CAPSULE(12.5 MG) BY MOUTH DAILY, Disp: 90 capsule, Rfl: 1 .  ibuprofen (ADVIL,MOTRIN) 200 MG tablet, , Disp: , Rfl:  .  Lancets (ACCU-CHEK MULTICLIX) lancets, To check blood glucose once daily, Disp: 100 each, Rfl: 12 .  metoprolol tartrate (LOPRESSOR) 25 MG tablet, TAKE 1 TABLET(25 MG) BY MOUTH TWICE DAILY, Disp: 180 tablet, Rfl: 0 .  mometasone (ELOCON) 0.1 % cream, , Disp: , Rfl:  .  Multiple Vitamin tablet, , Disp: , Rfl:  .  Omega-3 Fatty Acids (FISH OIL) 1200 MG CPDR, Take by mouth daily., Disp: , Rfl:  .  Semaglutide,0.25 or 0.'5MG'$ /DOS, (OZEMPIC, 0.25 OR 0.5 MG/DOSE,) 2 MG/1.5ML SOPN, Inject 0.5 mg into the skin once a week., Disp: 4 pen, Rfl: 5 .  simvastatin (ZOCOR) 10 MG tablet, TAKE 1 TABLET(10 MG) BY MOUTH AT BEDTIME, Disp: 90 tablet, Rfl: 1   Patient Care Team: Rubye Beach as PCP - General (Family Medicine)    Objective:    Vitals: BP (!) 112/58 (BP Location: Left Arm, Patient Position: Sitting, Cuff Size: Large)   Pulse (!) 101   Temp (!) 97.3 F (36.3 C) (Temporal)   Resp 16   Ht 5' (1.524 m)   Wt 214 lb (97.1 kg)   BMI 41.79 kg/m    Vitals:   08/07/19 1618  BP: (!) 112/58  Pulse: (!) 101  Resp: 16  Temp: (!) 97.3 F (36.3 C)  TempSrc: Temporal  Weight: 214 lb (97.1 kg)  Height: 5' (1.524 m)     Physical Exam Vitals reviewed.  Constitutional:      General: She is not in acute distress.    Appearance: Normal appearance. She is well-developed. She is obese. She is not ill-appearing or diaphoretic.  HENT:     Head: Normocephalic and atraumatic.     Right Ear: Tympanic membrane, ear canal and external ear normal.     Left Ear: Tympanic membrane, ear canal and external ear normal.  Eyes:     General: No scleral icterus.       Right eye: No discharge.        Left eye: No discharge.     Extraocular Movements:  Extraocular movements intact.     Conjunctiva/sclera: Conjunctivae normal.     Pupils: Pupils are equal, round, and reactive to light.  Neck:     Thyroid: No thyromegaly.     Vascular: No carotid bruit or JVD.     Trachea: No tracheal deviation.  Cardiovascular:     Rate and Rhythm: Normal rate and regular rhythm.     Pulses: Normal  pulses.     Heart sounds: Normal heart sounds. No murmur. No friction rub. No gallop.   Pulmonary:     Effort: Pulmonary effort is normal. No respiratory distress.     Breath sounds: Normal breath sounds. No wheezing or rales.  Chest:     Chest wall: No tenderness.  Abdominal:     General: Abdomen is protuberant. Bowel sounds are normal. There is no distension.     Palpations: Abdomen is soft. There is no mass.     Tenderness: There is no abdominal tenderness. There is no guarding or rebound.  Musculoskeletal:        General: No tenderness. Normal range of motion.     Cervical back: Normal range of motion and neck supple.     Right lower leg: No edema.     Left lower leg: No edema.  Lymphadenopathy:     Cervical: No cervical adenopathy.  Skin:    General: Skin is warm and dry.     Capillary Refill: Capillary refill takes less than 2 seconds.     Findings: No rash.  Neurological:     General: No focal deficit present.     Mental Status: She is alert and oriented to person, place, and time. Mental status is at baseline.  Psychiatric:        Mood and Affect: Mood normal.        Behavior: Behavior normal.        Thought Content: Thought content normal.        Judgment: Judgment normal.      Depression Screen PHQ 2/9 Scores 08/07/2019 11/04/2015  PHQ - 2 Score 0 1  PHQ- 9 Score 2 -       Assessment & Plan:     Routine Health Maintenance and Physical Exam  Exercise Activities and Dietary recommendations Goals   None      There is no immunization history on file for this patient.  Health Maintenance  Topic Date Due  . PNEUMOCOCCAL  POLYSACCHARIDE VACCINE AGE 82-64 HIGH RISK  Never done  . TETANUS/TDAP  Never done  . PAP SMEAR-Modifier  Never done  . URINE MICROALBUMIN  07/25/2018  . HEMOGLOBIN A1C  10/28/2018  . INFLUENZA VACCINE  Never done  . FOOT EXAM  03/05/2019  . OPHTHALMOLOGY EXAM  03/20/2019  . MAMMOGRAM  08/03/2019  . Fecal DNA (Cologuard)  08/03/2020  . Hepatitis C Screening  Completed  . HIV Screening  Completed     Discussed health benefits of physical activity, and encouraged her to engage in regular exercise appropriate for her age and condition.    1. Annual physical exam Normal physical exam today. Patient declined pap, vaccines. Will check labs as below and f/u pending lab results. If labs are stable and WNL she will not need to have these rechecked for one year at her next annual physical exam. She is to call the office in the meantime if she has any acute issue, questions or concerns. - CBC with Differential/Platelet - Comprehensive metabolic panel - Hemoglobin A1c - TSH - Lipid panel  2. Essential (primary) hypertension Stable. Continue Metoprolol '25mg'$  BID and HCTZ 12.'5mg'$ . Will check labs as below and f/u pending results. - CBC with Differential/Platelet - Comprehensive metabolic panel - Hemoglobin A1c - TSH - Lipid panel  3. Abnormal liver enzymes Diet controlled. Will check labs as below and f/u pending results. - CBC with Differential/Platelet - Comprehensive metabolic panel - Hemoglobin A1c - TSH - Lipid  panel  4. Hypercholesterolemia with hypertriglyceridemia Stable. Continue Simvastatin 39m. Will check labs as below and f/u pending results. - CBC with Differential/Platelet - Comprehensive metabolic panel - Hemoglobin A1c - TSH - Lipid panel  5. Class 3 severe obesity due to excess calories with serious comorbidity and body mass index (BMI) of 40.0 to 44.9 in adult ([facility] Counseled patient on healthy lifestyle modifications including dieting and exercise.  - CBC with  Differential/Platelet - Comprehensive metabolic panel - Hemoglobin A1c - TSH - Lipid panel  6. Type 2 diabetes mellitus with hyperglycemia, without long-term current use of insulin ([place] Knows she needs eye exam and will call to schedule. Declines vaccinations. Continue Farxiga 545m Glipizide XL 1032mand Ozempic 0.5mg52mekly. Microalbumin normal at 20. Will check labs as below and f/u pending results. - CBC with Differential/Platelet - Comprehensive metabolic panel - Hemoglobin A1c - TSH - Lipid panel - glipiZIDE (GLUCOTROL XL) 10 MG 24 hr tablet; TAKE 1 TABLET(10 MG) BY MOUTH DAILY WITH BREAKFAST  Dispense: 90 tablet; Refill: 1  8. Ear itching Chronic issue. Will try Fluocinonide as below.  - fluocinonide (LIDEX) 0.05 % external solution; Put 2-3 drops each ear twice daily prn itching  Dispense: 60 mL; Refill: 1  --------------------------------------------------------------------    JennMar Daring-C  [facility]

## 2019-08-07 ENCOUNTER — Ambulatory Visit (INDEPENDENT_AMBULATORY_CARE_PROVIDER_SITE_OTHER): Payer: 59 | Admitting: Physician Assistant

## 2019-08-07 ENCOUNTER — Encounter: Payer: Self-pay | Admitting: Physician Assistant

## 2019-08-07 ENCOUNTER — Other Ambulatory Visit: Payer: Self-pay

## 2019-08-07 VITALS — BP 112/58 | HR 101 | Temp 97.3°F | Resp 16 | Ht 60.0 in | Wt 214.0 lb

## 2019-08-07 DIAGNOSIS — E1165 Type 2 diabetes mellitus with hyperglycemia: Secondary | ICD-10-CM | POA: Diagnosis not present

## 2019-08-07 DIAGNOSIS — L299 Pruritus, unspecified: Secondary | ICD-10-CM | POA: Diagnosis not present

## 2019-08-07 DIAGNOSIS — R748 Abnormal levels of other serum enzymes: Secondary | ICD-10-CM

## 2019-08-07 DIAGNOSIS — Z6841 Body Mass Index (BMI) 40.0 and over, adult: Secondary | ICD-10-CM

## 2019-08-07 DIAGNOSIS — I10 Essential (primary) hypertension: Secondary | ICD-10-CM | POA: Diagnosis not present

## 2019-08-07 DIAGNOSIS — E782 Mixed hyperlipidemia: Secondary | ICD-10-CM | POA: Diagnosis not present

## 2019-08-07 DIAGNOSIS — Z0000 Encounter for general adult medical examination without abnormal findings: Secondary | ICD-10-CM

## 2019-08-07 LAB — POCT UA - MICROALBUMIN: Microalbumin Ur, POC: 20 mg/L

## 2019-08-07 MED ORDER — GLIPIZIDE ER 10 MG PO TB24: ORAL_TABLET | ORAL | 1 refills | Status: DC

## 2019-08-07 MED ORDER — FLUOCINONIDE 0.05 % EX SOLN: CUTANEOUS | 1 refills | Status: DC

## 2019-08-07 NOTE — Patient Instructions (Signed)

## 2019-08-10 ENCOUNTER — Ambulatory Visit
Admission: RE | Admit: 2019-08-10 | Discharge: 2019-08-10 | Disposition: A | Discharge status: Home / Self Care | Payer: 59 | Source: Ambulatory Visit | Attending: Physician Assistant | Admitting: Physician Assistant

## 2019-08-10 DIAGNOSIS — Z1231 Encounter for screening mammogram for malignant neoplasm of breast: Secondary | ICD-10-CM | POA: Diagnosis not present

## 2019-08-10 DIAGNOSIS — I10 Essential (primary) hypertension: Secondary | ICD-10-CM | POA: Diagnosis not present

## 2019-08-10 DIAGNOSIS — E782 Mixed hyperlipidemia: Secondary | ICD-10-CM | POA: Diagnosis not present

## 2019-08-10 DIAGNOSIS — Z6841 Body Mass Index (BMI) 40.0 and over, adult: Secondary | ICD-10-CM | POA: Diagnosis not present

## 2019-08-10 DIAGNOSIS — R748 Abnormal levels of other serum enzymes: Secondary | ICD-10-CM | POA: Diagnosis not present

## 2019-08-10 DIAGNOSIS — Z0000 Encounter for general adult medical examination without abnormal findings: Secondary | ICD-10-CM | POA: Diagnosis not present

## 2019-08-10 DIAGNOSIS — E1165 Type 2 diabetes mellitus with hyperglycemia: Secondary | ICD-10-CM | POA: Diagnosis not present

## 2019-08-11 LAB — COMPREHENSIVE METABOLIC PANEL
ALT: 33 IU/L — ABNORMAL HIGH (ref 0–32)
AST: 41 IU/L — ABNORMAL HIGH (ref 0–40)
Albumin/Globulin Ratio: 1.5 (ref 1.2–2.2)
Albumin: 4.1 g/dL (ref 3.8–4.8)
Alkaline Phosphatase: 88 IU/L (ref 39–117)
BUN/Creatinine Ratio: 19 (ref 12–28)
BUN: 11 mg/dL (ref 8–27)
Bilirubin Total: 0.4 mg/dL (ref 0.0–1.2)
CO2: 25 mmol/L (ref 20–29)
Calcium: 9.2 mg/dL (ref 8.7–10.3)
Chloride: 99 mmol/L (ref 96–106)
Creatinine, Ser: 0.59 mg/dL (ref 0.57–1.00)
GFR calc Af Amer: 114 mL/min/{1.73_m2} (ref >59)
GFR calc non Af Amer: 99 mL/min/{1.73_m2} (ref >59)
Globulin, Total: 2.7 g/dL (ref 1.5–4.5)
Glucose: 204 mg/dL — ABNORMAL HIGH (ref 65–99)
Potassium: 3.7 mmol/L (ref 3.5–5.2)
Sodium: 138 mmol/L (ref 134–144)
Total Protein: 6.8 g/dL (ref 6.0–8.5)

## 2019-08-11 LAB — CBC WITH DIFFERENTIAL/PLATELET
Basophils Absolute: 0 10*3/uL (ref 0.0–0.2)
Basos: 0 %
EOS (ABSOLUTE): 0.1 10*3/uL (ref 0.0–0.4)
Eos: 1 %
Hematocrit: 40.6 % (ref 34.0–46.6)
Hemoglobin: 13.9 g/dL (ref 11.1–15.9)
Immature Grans (Abs): 0 10*3/uL (ref 0.0–0.1)
Immature Granulocytes: 0 %
Lymphocytes Absolute: 2.2 10*3/uL (ref 0.7–3.1)
Lymphs: 24 %
MCH: 32 pg (ref 26.6–33.0)
MCHC: 34.2 g/dL (ref 31.5–35.7)
MCV: 94 fL (ref 79–97)
Monocytes Absolute: 0.7 10*3/uL (ref 0.1–0.9)
Monocytes: 8 %
Neutrophils Absolute: 6.3 10*3/uL (ref 1.4–7.0)
Neutrophils: 67 %
Platelets: 209 10*3/uL (ref 150–450)
RBC: 4.34 x10E6/uL (ref 3.77–5.28)
RDW: 12.6 % (ref 11.7–15.4)
WBC: 9.4 10*3/uL (ref 3.4–10.8)

## 2019-08-11 LAB — LIPID PANEL
Chol/HDL Ratio: 3.5 ratio (ref 0.0–4.4)
Cholesterol, Total: 140 mg/dL (ref 100–199)
HDL: 40 mg/dL (ref >39)
LDL Chol Calc (NIH): 68 mg/dL (ref 0–99)
Triglycerides: 192 mg/dL — ABNORMAL HIGH (ref 0–149)
VLDL Cholesterol Cal: 32 mg/dL (ref 5–40)

## 2019-08-11 LAB — HEMOGLOBIN A1C
Est. average glucose Bld gHb Est-mCnc: 180 mg/dL
Hgb A1c MFr Bld: 7.9 % — ABNORMAL HIGH (ref 4.8–5.6)

## 2019-08-11 LAB — TSH: TSH: 2.37 u[IU]/mL (ref 0.450–4.500)

## 2019-08-12 ENCOUNTER — Telehealth: Payer: Self-pay

## 2019-08-12 NOTE — Telephone Encounter (Signed)
LMTCB, PEC Triage Nurse may give patient results  

## 2019-08-12 NOTE — Telephone Encounter (Signed)
Patient notified of lab results and PCP recommendation. Patient will do as recommended and has marked calender to have labs repeated.

## 2019-08-12 NOTE — Telephone Encounter (Signed)
-----   Message from Margaretann Loveless, [place] sent at 08/12/2019 10:06 AM EDT ----- Blood count is normal. Kidney function is normal. Liver enzymes are stable but remain borderline high. Sodium, potassium and calcium are normal. A1c is up to 7.9 from 6.5. Continue medications and really focus on limiting carbs and sugars. I would recommend to recheck A1c in 3 months. Cholesterol is normal.

## 2019-08-13 ENCOUNTER — Telehealth: Payer: Self-pay

## 2019-08-13 DIAGNOSIS — L299 Pruritus, unspecified: Secondary | ICD-10-CM

## 2019-08-13 MED ORDER — FLUOCINOLONE ACETONIDE 0.01 % OT OIL: 1.0000 [drp] | TOPICAL_OIL | Freq: Two times a day (BID) | OTIC | 0 refills | Status: DC

## 2019-08-13 NOTE — Telephone Encounter (Signed)
Oh my I sent the wrong thing. I meant to send fluocinolone. This has been changed. Tell them thanks for the catch.

## 2019-08-13 NOTE — Telephone Encounter (Signed)
Copied from CRM [id number]. Topic: General - Other >> Aug 12, 2019  4:50 PM Mcneil, Ja-Kwan wrote: Reason for CRM: Shu with [facility] called for clarification for the Rx for fluocinonide (LIDEX) 0.05 % external solution because it is a topical solution typically not used for the ear. Shu requests call back.

## 2019-08-13 NOTE — Telephone Encounter (Signed)
Done

## 2019-08-17 ENCOUNTER — Other Ambulatory Visit: Payer: Self-pay | Admitting: *Deleted

## 2019-08-17 ENCOUNTER — Inpatient Hospital Stay
Admission: RE | Admit: 2019-08-17 | Discharge: 2019-08-17 | Disposition: A | Discharge status: Home / Self Care | Payer: Self-pay | Source: Ambulatory Visit | Attending: *Deleted | Admitting: *Deleted

## 2019-08-17 ENCOUNTER — Telehealth: Payer: Self-pay

## 2019-08-17 DIAGNOSIS — Z1231 Encounter for screening mammogram for malignant neoplasm of breast: Secondary | ICD-10-CM

## 2019-08-17 NOTE — Telephone Encounter (Signed)
-----   Message from Margaretann Loveless, [place] sent at 08/17/2019 11:21 AM EDT ----- Normal mammogram. Repeat screening in one year.

## 2019-08-17 NOTE — Telephone Encounter (Signed)
LMTCB-If patient calls back Ok for the PEC to give results 

## 2019-08-18 NOTE — Telephone Encounter (Signed)
Normal mammogram. Repeat screening in one year.  Written by Margaretann Loveless, PA-C on 08/17/2019 11:21 AM EDT Seen by patient Dana Wallace on 08/17/2019 8:35 PM EDT

## 2019-09-08 ENCOUNTER — Other Ambulatory Visit: Payer: Self-pay | Admitting: Physician Assistant

## 2019-09-08 DIAGNOSIS — I10 Essential (primary) hypertension: Secondary | ICD-10-CM

## 2019-09-08 NOTE — Telephone Encounter (Signed)
Requested Prescriptions  Pending Prescriptions Disp Refills  . metoprolol tartrate (LOPRESSOR) 25 MG tablet [Pharmacy Med Name: METOPROLOL TARTRATE 25MG  TABLETS] 180 tablet 1    Sig: TAKE 1 TABLET(25 MG) BY MOUTH TWICE DAILY     Cardiovascular:  Beta Blockers Failed - 09/08/2019  3:20 AM      Failed - Valid encounter within last 6 months    Recent Outpatient Visits          1 month ago Annual physical exam   [facility] M, PA-C   1 year ago Type 2 diabetes mellitus without complication, without long-term current use of insulin [facility])   [facility] [place], [place], [place]   1 year ago Type 2 diabetes mellitus without complication, without long-term current use of insulin [facility])   [facility] [place], [place], Alessandra Bevels   2 years ago Essential (primary) hypertension   [place], [place], [place]   2 years ago Essential (primary) hypertension   [facility] M, M             Passed - Last BP in normal range    BP Readings from Last 1 Encounters:  08/07/19 (!) 112/58         Passed - Last Heart Rate in normal range    Pulse Readings from Last 1 Encounters:  08/07/19 (!) 101         Patient had valid encounter one month ago for annual physical.

## 2019-10-22 DIAGNOSIS — D2362 Other benign neoplasm of skin of left upper limb, including shoulder: Secondary | ICD-10-CM | POA: Diagnosis not present

## 2019-10-22 DIAGNOSIS — L821 Other seborrheic keratosis: Secondary | ICD-10-CM | POA: Diagnosis not present

## 2019-10-22 DIAGNOSIS — L738 Other specified follicular disorders: Secondary | ICD-10-CM | POA: Diagnosis not present

## 2019-11-05 ENCOUNTER — Other Ambulatory Visit: Payer: Self-pay | Admitting: Physician Assistant

## 2019-11-05 DIAGNOSIS — E782 Mixed hyperlipidemia: Secondary | ICD-10-CM

## 2019-11-05 MED ORDER — SIMVASTATIN 10 MG PO TABS: ORAL_TABLET | ORAL | 1 refills | Status: DC

## 2019-11-05 NOTE — Telephone Encounter (Signed)
[facility] faxed refill request for the following medications:   simvastatin (ZOCOR) 10 MG tablet     Please advise.  

## 2019-11-08 ENCOUNTER — Other Ambulatory Visit: Payer: Self-pay | Admitting: Physician Assistant

## 2019-11-08 DIAGNOSIS — I10 Essential (primary) hypertension: Secondary | ICD-10-CM

## 2019-11-08 NOTE — Telephone Encounter (Signed)
Requested Prescriptions  Pending Prescriptions Disp Refills  . hydrochlorothiazide (MICROZIDE) 12.5 MG capsule [Pharmacy Med Name: HYDROCHLOROTHIAZIDE 12.5MG  CAPSULES] 90 capsule 1    Sig: TAKE 1 CAPSULE(12.5 MG) BY MOUTH DAILY     Cardiovascular: Diuretics - Thiazide Failed - 11/08/2019 10:01 AM      Failed - Valid encounter within last 6 months    Recent Outpatient Visits          3 months ago Annual physical exam   [facility] [place], Victorino Dike M, PA-C   1 year ago Type 2 diabetes mellitus without complication, without long-term current use of insulin [facility])   [facility] [place], [place], [place]   1 year ago Type 2 diabetes mellitus without complication, without long-term current use of insulin [facility])   [facility] [place], [place], [place]   2 years ago Essential (primary) hypertension   [facility], [place], PA-C   3 years ago Essential (primary) hypertension   [facility], Victorino Dike M, PA-C             Passed - Ca in normal range and within 360 days    Calcium  Date Value Ref Range Status  08/10/2019 9.2 8.7 - 10.3 mg/dL Final         Passed - Cr in normal range and within 360 days    Creatinine, Ser  Date Value Ref Range Status  08/10/2019 0.59 0.57 - 1.00 mg/dL Final         Passed - K in normal range and within 360 days    Potassium  Date Value Ref Range Status  08/10/2019 3.7 3.5 - 5.2 mmol/L Final         Passed - Na in normal range and within 360 days    Sodium  Date Value Ref Range Status  08/10/2019 138 134 - 144 mmol/L Final         Passed - Last BP in normal range    BP Readings from Last 1 Encounters:  08/07/19 (!) 112/58

## 2019-11-20 ENCOUNTER — Encounter: Payer: Self-pay | Admitting: Physician Assistant

## 2019-11-20 DIAGNOSIS — I10 Essential (primary) hypertension: Secondary | ICD-10-CM

## 2019-11-20 DIAGNOSIS — E1165 Type 2 diabetes mellitus with hyperglycemia: Secondary | ICD-10-CM

## 2019-11-24 MED ORDER — METOPROLOL TARTRATE 25 MG PO TABS: 25.0000 mg | ORAL_TABLET | Freq: Two times a day (BID) | ORAL | 1 refills | Status: DC

## 2019-11-24 MED ORDER — GLIPIZIDE ER 10 MG PO TB24: ORAL_TABLET | ORAL | 1 refills | Status: DC

## 2019-11-24 MED ORDER — HYDROCHLOROTHIAZIDE 12.5 MG PO CAPS: ORAL_CAPSULE | ORAL | 1 refills | Status: DC

## 2019-11-24 NOTE — Telephone Encounter (Signed)
I didn't pulled Ozempic or Lipitor(generic) down because I don't see it in the current med list.

## 2019-12-25 ENCOUNTER — Other Ambulatory Visit: Payer: Self-pay | Admitting: Physician Assistant

## 2019-12-25 DIAGNOSIS — E119 Type 2 diabetes mellitus without complications: Secondary | ICD-10-CM

## 2019-12-25 NOTE — Telephone Encounter (Signed)
Requested medication (s) are due for refill today -yes  Requested medication (s) are on the active medication list -yes  Future visit scheduled -no  Last refill: 11/29/19  Notes to clinic: patient has been in office for her annual exam- but for some reason- protocol not picking this up.Sent for review of request  Requested Prescriptions  Pending Prescriptions Disp Refills   OZEMPIC, 0.25 OR 0.5 MG/DOSE, 2 MG/1.5ML SOPN [Pharmacy Med Name: OZEMPIC 0.25 OR 0.5MG /DOS 1X2MG  PEN] 6 mL     Sig: INJECT 0.5MG  INTO THE SKIN ONCE A WEEK      Endocrinology:  Diabetes - GLP-1 Receptor Agonists Failed - 12/25/2019  3:11 AM      Failed - Valid encounter within last 6 months    Recent Outpatient Visits           4 months ago Annual physical exam   [facility] Joycelyn Man M, PA-C   1 year ago Type 2 diabetes mellitus without complication, without long-term current use of insulin [facility])   [facility] [place], [place], [place]   1 year ago Type 2 diabetes mellitus without complication, without long-term current use of insulin [facility])   [facility] [place], [place], [place]   2 years ago Essential (primary) hypertension   [facility], [place], PA-C   3 years ago Essential (primary) hypertension   [facility], Victorino Dike M, PA-C              Passed - HBA1C is between 0 and 7.9 and within 180 days    Hemoglobin A1C  Date Value Ref Range Status  06/25/2014 5.9  Final   Hgb A1c MFr Bld  Date Value Ref Range Status  08/10/2019 7.9 (H) 4.8 - 5.6 % Final    Comment:             Prediabetes: 5.7 - 6.4          Diabetes: >6.4          Glycemic control for adults with diabetes: <7.0               Requested Prescriptions  Pending Prescriptions Disp Refills   OZEMPIC, 0.25 OR 0.5 MG/DOSE, 2 MG/1.5ML SOPN [Pharmacy Med Name: OZEMPIC 0.25 OR 0.5MG /DOS 1X2MG  PEN] 6 mL     Sig: INJECT 0.5MG  INTO  THE SKIN ONCE A WEEK      Endocrinology:  Diabetes - GLP-1 Receptor Agonists Failed - 12/25/2019  3:11 AM      Failed - Valid encounter within last 6 months    Recent Outpatient Visits           4 months ago Annual physical exam   [facility] Joycelyn Man M, PA-C   1 year ago Type 2 diabetes mellitus without complication, without long-term current use of insulin [facility])   [facility] [place], [place], [place]   1 year ago Type 2 diabetes mellitus without complication, without long-term current use of insulin [facility])   [facility] [place], [place], [place]   2 years ago Essential (primary) hypertension   [facility], [place], [place]   3 years ago Essential (primary) hypertension   [facility], Victorino Dike M, PA-C              Passed - HBA1C is between 0 and 7.9 and within 180 days    Hemoglobin A1C  Date Value Ref Range Status  06/25/2014  5.9  Final   Hgb A1c MFr Bld  Date Value Ref Range Status  08/10/2019 7.9 (H) 4.8 - 5.6 % Final    Comment:             Prediabetes: 5.7 - 6.4          Diabetes: >6.4          Glycemic control for adults with diabetes: <7.0

## 2020-02-29 ENCOUNTER — Other Ambulatory Visit: Payer: Self-pay

## 2020-02-29 ENCOUNTER — Encounter: Payer: Self-pay | Admitting: Physician Assistant

## 2020-02-29 ENCOUNTER — Ambulatory Visit (INDEPENDENT_AMBULATORY_CARE_PROVIDER_SITE_OTHER): Payer: BC Managed Care – PPO | Admitting: Physician Assistant

## 2020-02-29 VITALS — BP 101/67 | HR 68 | Temp 98.2°F | Resp 16 | Ht 60.0 in | Wt 214.0 lb

## 2020-02-29 DIAGNOSIS — R748 Abnormal levels of other serum enzymes: Secondary | ICD-10-CM | POA: Diagnosis not present

## 2020-02-29 DIAGNOSIS — E119 Type 2 diabetes mellitus without complications: Secondary | ICD-10-CM | POA: Diagnosis not present

## 2020-02-29 DIAGNOSIS — I10 Essential (primary) hypertension: Secondary | ICD-10-CM | POA: Diagnosis not present

## 2020-02-29 DIAGNOSIS — E782 Mixed hyperlipidemia: Secondary | ICD-10-CM | POA: Diagnosis not present

## 2020-02-29 DIAGNOSIS — Z6841 Body Mass Index (BMI) 40.0 and over, adult: Secondary | ICD-10-CM

## 2020-02-29 MED ORDER — METFORMIN HCL 500 MG PO TABS: 500.0000 mg | ORAL_TABLET | Freq: Two times a day (BID) | ORAL | 3 refills | Status: DC

## 2020-02-29 MED ORDER — PIOGLITAZONE HCL 30 MG PO TABS: 30.0000 mg | ORAL_TABLET | Freq: Every day | ORAL | 1 refills | Status: DC

## 2020-02-29 NOTE — Progress Notes (Signed)
Established patient visit   Patient: Dana Wallace   DOB: [birth year]   64 y.o. Female  MRN: [id number] Visit Date: 02/29/2020  Today's healthcare provider: Mar Daring, PA-C   Chief Complaint  Patient presents with  . Follow-up   Subjective    HPI Patient Declined Influenza vaccine.  Diabetes Mellitus Type II, Follow-up  Lab Results  Component Value Date   HGBA1C 8.3 (H) 02/29/2020   HGBA1C 7.9 (H) 08/10/2019   HGBA1C 6.5 (A) 04/28/2018   Wt Readings from Last 3 Encounters:  02/29/20 214 lb (97.1 kg)  08/07/19 214 lb (97.1 kg)  04/28/18 212 lb 14.4 oz (96.6 kg)   Last seen for diabetes 6 months ago.  Management since then includes Continue Farxiga 89m, Glipizide XL 122m and Ozempic 0.76m7meekly. She reports excellent compliance with treatment. She is not having side effects.  Symptoms:  No fatigue No foot ulcerations  No appetite changes No nausea  No paresthesia of the feet  No polydipsia  No polyuria No visual disturbances   No vomiting     Home blood sugar records: fasting range: 110-180 does not checked everyday but in general in in the 140's-150's  Episodes of hypoglycemia? No    Most Recent Eye Exam: 07/2019 Current exercise: none Current diet habits: well balanced --------------------------------------------------------------------------------------------------- Hypertension, follow-up  BP Readings from Last 3 Encounters:  02/29/20 101/67  08/07/19 (!) 112/58  04/28/18 131/82   Wt Readings from Last 3 Encounters:  02/29/20 214 lb (97.1 kg)  08/07/19 214 lb (97.1 kg)  04/28/18 212 lb 14.4 oz (96.6 kg)     She was last seen for hypertension 6 months ago.  BP at that visit was 112/58. Management since that visit includes stable. Continue Metoprolol 276m91mD and HCTZ 12.76mg 36me reports excellent compliance with treatment. She is not having side effects.  She does not smoke.  Use of agents associated with hypertension: none.    Outside blood pressures are sometimes.120's/70's Symptoms: No chest pain No chest pressure  No palpitations No syncope  No dyspnea No orthopnea  No paroxysmal nocturnal dyspnea No lower extremity edema    --------------------------------------------------------------------------------------------------- Lipid/Cholesterol, Follow-up  Last lipid panel Other pertinent labs  Lab Results  Component Value Date   CHOL 149 02/29/2020   HDL 49 02/29/2020   LDLCALC 78 02/29/2020   TRIG 126 02/29/2020   CHOLHDL 3.0 02/29/2020   Lab Results  Component Value Date   ALT 25 02/29/2020   AST 27 02/29/2020   PLT 185 02/29/2020   TSH 1.570 02/29/2020     She was last seen for this 6 months ago.  Management since that visit includes Stable. Continue Simvastatin 10mg.61me reports excellent compliance with treatment. She is not having side effects.   Symptoms: No chest pain No chest pressure/discomfort  No dyspnea No lower extremity edema  No numbness or tingling of extremity No orthopnea  No palpitations No paroxysmal nocturnal dyspnea  No speech difficulty No syncope   --------------------------------------------------------------------------------------------------- Patient Active Problem List   Diagnosis Date Noted  . Class 3 severe obesity due to excess calories with serious comorbidity and body mass index (BMI) of 40.0 to 44.9 in adult (HCC) 1Export1/2021  . Hypercholesterolemia with hypertriglyceridemia 11/07/2015  . Clinical depression 10/05/2015  . Abnormal liver enzymes 10/05/2015  . Type 2 diabetes mellitus without complication, without long-term current use of insulin (HCC) 0[place]7/2017  . Essential (primary) hypertension 04/26/2015  . Anxiety 11/08/2014   History  reviewed. No pertinent past medical history.      Medications: Outpatient Medications Prior to Visit  Medication Sig  . ALPRAZolam (XANAX) 0.5 MG tablet TAKE 1/2 TO 1 TABLET BY MOUTH TWICE DAILY AS NEEDED  .  aspirin 81 MG tablet Take by mouth.  . Blood Glucose Monitoring Suppl (ACCU-CHEK AVIVA PLUS) w/Device KIT To check blood glucose once daily  . glipiZIDE (GLUCOTROL XL) 10 MG 24 hr tablet TAKE 1 TABLET(10 MG) BY MOUTH DAILY WITH BREAKFAST  . glucose blood (ACCU-CHEK AVIVA PLUS) test strip To check blood glucose once daily  . hydrochlorothiazide (MICROZIDE) 12.5 MG capsule TAKE 1 CAPSULE(12.5 MG) BY MOUTH DAILY  . ibuprofen (ADVIL,MOTRIN) 200 MG tablet   . Lancets (ACCU-CHEK MULTICLIX) lancets To check blood glucose once daily  . metoprolol tartrate (LOPRESSOR) 25 MG tablet Take 1 tablet (25 mg total) by mouth 2 (two) times daily.  . mometasone (ELOCON) 0.1 % cream   . Multiple Vitamin tablet   . Omega-3 Fatty Acids (FISH OIL) 1200 MG CPDR Take by mouth daily.  . simvastatin (ZOCOR) 10 MG tablet TAKE 1 TABLET(10 MG) BY MOUTH AT BEDTIME  . [DISCONTINUED] OZEMPIC, 0.25 OR 0.5 MG/DOSE, 2 MG/1.5ML SOPN INJECT 0.5MG INTO THE SKIN ONCE A WEEK  . [DISCONTINUED] FARXIGA 5 MG TABS tablet TAKE 1 TABLET BY MOUTH DAILY  . [DISCONTINUED] Fluocinolone Acetonide 0.01 % OIL Place 1-2 drops in ear(s) in the morning and at bedtime.   No facility-administered medications prior to visit.    Review of Systems  Constitutional: Negative.   Respiratory: Negative.   Cardiovascular: Negative.   Genitourinary: Negative.   Neurological: Negative.     Last CBC Lab Results  Component Value Date   WBC 7.9 02/29/2020   HGB 13.8 02/29/2020   HCT 41.3 02/29/2020   MCV 95 02/29/2020   MCH 31.9 02/29/2020   RDW 12.3 02/29/2020   PLT 185 60/45/4098   Last metabolic panel Lab Results  Component Value Date   GLUCOSE 145 (H) 02/29/2020   NA 137 02/29/2020   K 4.0 02/29/2020   CL 98 02/29/2020   CO2 28 02/29/2020   BUN 8 02/29/2020   CREATININE 0.54 (L) 02/29/2020   GFRNONAA 101 02/29/2020   GFRAA 117 02/29/2020   CALCIUM 9.6 02/29/2020   PROT 6.8 02/29/2020   ALBUMIN 3.9 02/29/2020   LABGLOB 2.9  02/29/2020   AGRATIO 1.3 02/29/2020   BILITOT 0.6 02/29/2020   ALKPHOS 96 02/29/2020   AST 27 02/29/2020   ALT 25 02/29/2020      Objective    BP 101/67 (BP Location: Left Arm, Patient Position: Sitting, Cuff Size: Large)   Pulse 68   Temp 98.2 F (36.8 C) (Oral)   Resp 16   Ht 5' (1.524 m)   Wt 214 lb (97.1 kg)   BMI 41.79 kg/m  BP Readings from Last 3 Encounters:  02/29/20 101/67  08/07/19 (!) 112/58  04/28/18 131/82   Wt Readings from Last 3 Encounters:  02/29/20 214 lb (97.1 kg)  08/07/19 214 lb (97.1 kg)  04/28/18 212 lb 14.4 oz (96.6 kg)      Physical Exam Vitals reviewed.  Constitutional:      General: She is not in acute distress.    Appearance: Normal appearance. She is well-developed. She is obese. She is not ill-appearing or diaphoretic.  Cardiovascular:     Rate and Rhythm: Normal rate and regular rhythm.     Pulses: Normal pulses.     Heart sounds:  Normal heart sounds. No murmur heard.  No friction rub. No gallop.   Pulmonary:     Effort: Pulmonary effort is normal. No respiratory distress.     Breath sounds: Normal breath sounds. No wheezing or rales.  Musculoskeletal:     Cervical back: Normal range of motion and neck supple.  Neurological:     Mental Status: She is alert.      Results for orders placed or performed in visit on 02/29/20  CBC with Differential/Platelet  Result Value Ref Range   WBC 7.9 3.4 - 10.8 x10E3/uL   RBC 4.33 3.77 - 5.28 x10E6/uL   Hemoglobin 13.8 11.1 - 15.9 g/dL   Hematocrit 41.3 34.0 - 46.6 %   MCV 95 79 - 97 fL   MCH 31.9 26.6 - 33.0 pg   MCHC 33.4 31 - 35 g/dL   RDW 12.3 11.7 - 15.4 %   Platelets 185 150 - 450 x10E3/uL   Neutrophils 64 Not Estab. %   Lymphs 26 Not Estab. %   Monocytes 7 Not Estab. %   Eos 2 Not Estab. %   Basos 1 Not Estab. %   Neutrophils Absolute 5.1 1 - 7 x10E3/uL   Lymphocytes Absolute 2.0 0 - 3 x10E3/uL   Monocytes Absolute 0.5 0 - 0 x10E3/uL   EOS (ABSOLUTE) 0.2 0.0 - 0.4 x10E3/uL     Basophils Absolute 0.1 0 - 0 x10E3/uL   Immature Granulocytes 0 Not Estab. %   Immature Grans (Abs) 0.0 0.0 - 0.1 x10E3/uL  Comprehensive metabolic panel  Result Value Ref Range   Glucose 145 (H) 65 - 99 mg/dL   BUN 8 8 - 27 mg/dL   Creatinine, Ser 0.54 (L) 0.57 - 1.00 mg/dL   GFR calc non Af Amer 101 >59 mL/min/1.73   GFR calc Af Amer 117 >59 mL/min/1.73   BUN/Creatinine Ratio 15 12 - 28   Sodium 137 134 - 144 mmol/L   Potassium 4.0 3.5 - 5.2 mmol/L   Chloride 98 96 - 106 mmol/L   CO2 28 20 - 29 mmol/L   Calcium 9.6 8.7 - 10.3 mg/dL   Total Protein 6.8 6.0 - 8.5 g/dL   Albumin 3.9 3.8 - 4.8 g/dL   Globulin, Total 2.9 1.5 - 4.5 g/dL   Albumin/Globulin Ratio 1.3 1.2 - 2.2   Bilirubin Total 0.6 0.0 - 1.2 mg/dL   Alkaline Phosphatase 96 44 - 121 IU/L   AST 27 0 - 40 IU/L   ALT 25 0 - 32 IU/L  Hemoglobin A1c  Result Value Ref Range   Hgb A1c MFr Bld 8.3 (H) 4.8 - 5.6 %   Est. average glucose Bld gHb Est-mCnc 192 mg/dL  TSH  Result Value Ref Range   TSH 1.570 0.450 - 4.500 uIU/mL  Lipid panel  Result Value Ref Range   Cholesterol, Total 149 100 - 199 mg/dL   Triglycerides 126 0 - 149 mg/dL   HDL 49 >39 mg/dL   VLDL Cholesterol Cal 22 5 - 40 mg/dL   LDL Chol Calc (NIH) 78 0 - 99 mg/dL   Chol/HDL Ratio 3.0 0.0 - 4.4 ratio    Assessment & Plan     1. Type 2 diabetes mellitus without complication, without long-term current use of insulin ([place]) Will be losing insurance at the end of the month. Unable to afford Ozempic. Will change back to generic meds, metformin and pioglitazone, as below. Will check labs as below and f/u pending results. - Comprehensive metabolic  panel - Hemoglobin A1c - TSH - Lipid panel - metFORMIN (GLUCOPHAGE) 500 MG tablet; Take 1 tablet (500 mg total) by mouth 2 (two) times daily with a meal.  Dispense: 180 tablet; Refill: 3 - pioglitazone (ACTOS) 30 MG tablet; Take 1 tablet (30 mg total) by mouth daily.  Dispense: 90 tablet; Refill: 1  2.  Hypercholesterolemia with hypertriglyceridemia Stable. Continue Simvastatin 21m. Will check labs as below and f/u pending results. - Lipid panel  3. Essential (primary) hypertension Stable. Continue HCTZ 12.525m metoprolol 2564mID. Will check labs as below and f/u pending results. - CBC with Differential/Platelet - Comprehensive metabolic panel - Hemoglobin A1c - TSH - Lipid panel  4. Abnormal liver enzymes Diet controlled. Will check labs as below and f/u pending results. - Comprehensive metabolic panel - TSH - Lipid panel  5. Class 3 severe obesity due to excess calories with serious comorbidity and body mass index (BMI) of 40.0 to 44.9 in adult ([facility] Ltdounseled patient on healthy lifestyle modifications including dieting and exercise.   Return in about 6 months (around 08/29/2020) for CPE.      I, Reynolds BowlA-C, have reviewed all documentation for this visit. The documentation on 03/01/20 for the exam, diagnosis, procedures, and orders are all accurate and complete.   JenRubye BeachurNorthside Hospital [phone]) [phone])  [place]

## 2020-02-29 NOTE — Patient Instructions (Signed)

## 2020-03-01 ENCOUNTER — Encounter: Payer: Self-pay | Admitting: Physician Assistant

## 2020-03-01 LAB — CBC WITH DIFFERENTIAL/PLATELET
Basophils Absolute: 0.1 10*3/uL (ref 0.0–0.2)
Basos: 1 %
EOS (ABSOLUTE): 0.2 10*3/uL (ref 0.0–0.4)
Eos: 2 %
Hematocrit: 41.3 % (ref 34.0–46.6)
Hemoglobin: 13.8 g/dL (ref 11.1–15.9)
Immature Grans (Abs): 0 10*3/uL (ref 0.0–0.1)
Immature Granulocytes: 0 %
Lymphocytes Absolute: 2 10*3/uL (ref 0.7–3.1)
Lymphs: 26 %
MCH: 31.9 pg (ref 26.6–33.0)
MCHC: 33.4 g/dL (ref 31.5–35.7)
MCV: 95 fL (ref 79–97)
Monocytes Absolute: 0.5 10*3/uL (ref 0.1–0.9)
Monocytes: 7 %
Neutrophils Absolute: 5.1 10*3/uL (ref 1.4–7.0)
Neutrophils: 64 %
Platelets: 185 10*3/uL (ref 150–450)
RBC: 4.33 x10E6/uL (ref 3.77–5.28)
RDW: 12.3 % (ref 11.7–15.4)
WBC: 7.9 10*3/uL (ref 3.4–10.8)

## 2020-03-01 LAB — COMPREHENSIVE METABOLIC PANEL
ALT: 25 IU/L (ref 0–32)
AST: 27 IU/L (ref 0–40)
Albumin/Globulin Ratio: 1.3 (ref 1.2–2.2)
Albumin: 3.9 g/dL (ref 3.8–4.8)
Alkaline Phosphatase: 96 IU/L (ref 44–121)
BUN/Creatinine Ratio: 15 (ref 12–28)
BUN: 8 mg/dL (ref 8–27)
Bilirubin Total: 0.6 mg/dL (ref 0.0–1.2)
CO2: 28 mmol/L (ref 20–29)
Calcium: 9.6 mg/dL (ref 8.7–10.3)
Chloride: 98 mmol/L (ref 96–106)
Creatinine, Ser: 0.54 mg/dL — ABNORMAL LOW (ref 0.57–1.00)
GFR calc Af Amer: 117 mL/min/{1.73_m2} (ref >59)
GFR calc non Af Amer: 101 mL/min/{1.73_m2} (ref >59)
Globulin, Total: 2.9 g/dL (ref 1.5–4.5)
Glucose: 145 mg/dL — ABNORMAL HIGH (ref 65–99)
Potassium: 4 mmol/L (ref 3.5–5.2)
Sodium: 137 mmol/L (ref 134–144)
Total Protein: 6.8 g/dL (ref 6.0–8.5)

## 2020-03-01 LAB — HEMOGLOBIN A1C
Est. average glucose Bld gHb Est-mCnc: 192 mg/dL
Hgb A1c MFr Bld: 8.3 % — ABNORMAL HIGH (ref 4.8–5.6)

## 2020-03-01 LAB — LIPID PANEL
Chol/HDL Ratio: 3 ratio (ref 0.0–4.4)
Cholesterol, Total: 149 mg/dL (ref 100–199)
HDL: 49 mg/dL (ref >39)
LDL Chol Calc (NIH): 78 mg/dL (ref 0–99)
Triglycerides: 126 mg/dL (ref 0–149)
VLDL Cholesterol Cal: 22 mg/dL (ref 5–40)

## 2020-03-01 LAB — TSH: TSH: 1.57 u[IU]/mL (ref 0.450–4.500)

## 2020-03-14 ENCOUNTER — Encounter: Payer: Self-pay | Admitting: Physician Assistant

## 2020-03-28 ENCOUNTER — Encounter: Payer: Self-pay | Admitting: Physician Assistant

## 2020-03-29 ENCOUNTER — Other Ambulatory Visit: Payer: Self-pay

## 2020-03-29 DIAGNOSIS — Z111 Encounter for screening for respiratory tuberculosis: Secondary | ICD-10-CM

## 2020-03-29 NOTE — Progress Notes (Signed)
Order placed. Ok per [facility].

## 2020-04-17 ENCOUNTER — Other Ambulatory Visit: Payer: Self-pay | Admitting: Physician Assistant

## 2020-04-17 DIAGNOSIS — E782 Mixed hyperlipidemia: Secondary | ICD-10-CM

## 2020-04-17 NOTE — Telephone Encounter (Signed)
Requested Prescriptions  Pending Prescriptions Disp Refills   simvastatin (ZOCOR) 10 MG tablet [Pharmacy Med Name: SIMVASTATIN 10MG  TABLETS] 90 tablet 3    Sig: TAKE 1 TABLET(10 MG) BY MOUTH AT BEDTIME     Cardiovascular:  Antilipid - Statins Failed - 04/17/2020  8:09 AM      Failed - LDL in normal range and within 360 days    LDL Chol Calc (NIH)  Date Value Ref Range Status  02/29/2020 78 0 - 99 mg/dL Final         Passed - Total Cholesterol in normal range and within 360 days    Cholesterol, Total  Date Value Ref Range Status  02/29/2020 149 100 - 199 mg/dL Final         Passed - HDL in normal range and within 360 days    HDL  Date Value Ref Range Status  02/29/2020 49 >39 mg/dL Final         Passed - Triglycerides in normal range and within 360 days    Triglycerides  Date Value Ref Range Status  02/29/2020 126 0 - 149 mg/dL Final         Passed - Patient is not pregnant      Passed - Valid encounter within last 12 months    Recent Outpatient Visits          1 month ago Type 2 diabetes mellitus without complication, without long-term current use of insulin [facility])   [facility] [place], [place], PA-C   8 months ago Annual physical exam   [facility] [place], [place], PA-C   1 year ago Type 2 diabetes mellitus without complication, without long-term current use of insulin [facility])   [facility] [place], [place], [place]   2 years ago Type 2 diabetes mellitus without complication, without long-term current use of insulin [facility])   [facility] [place], [place], [place]   2 years ago Essential (primary) hypertension   [facility] [place], [place], [place]

## 2020-04-18 ENCOUNTER — Ambulatory Visit (LOCAL_COMMUNITY_HEALTH_CENTER): Payer: Self-pay

## 2020-04-18 ENCOUNTER — Other Ambulatory Visit: Payer: Self-pay

## 2020-04-18 DIAGNOSIS — Z111 Encounter for screening for respiratory tuberculosis: Secondary | ICD-10-CM

## 2020-04-21 ENCOUNTER — Ambulatory Visit (LOCAL_COMMUNITY_HEALTH_CENTER): Payer: Self-pay

## 2020-04-21 ENCOUNTER — Other Ambulatory Visit: Payer: Self-pay

## 2020-04-21 DIAGNOSIS — Z111 Encounter for screening for respiratory tuberculosis: Secondary | ICD-10-CM

## 2020-04-21 LAB — TB SKIN TEST
Induration: 0 mm
TB Skin Test: NEGATIVE

## 2020-05-28 ENCOUNTER — Encounter: Payer: Self-pay | Admitting: Physician Assistant

## 2020-05-28 DIAGNOSIS — E1165 Type 2 diabetes mellitus with hyperglycemia: Secondary | ICD-10-CM

## 2020-05-30 MED ORDER — GLIPIZIDE ER 10 MG PO TB24: ORAL_TABLET | ORAL | 1 refills | Status: DC

## 2020-06-11 ENCOUNTER — Other Ambulatory Visit: Payer: Self-pay | Admitting: Physician Assistant

## 2020-06-11 DIAGNOSIS — I10 Essential (primary) hypertension: Secondary | ICD-10-CM

## 2020-07-14 ENCOUNTER — Encounter: Payer: Self-pay | Admitting: Physician Assistant

## 2020-07-15 NOTE — Telephone Encounter (Signed)
LMCTB  07/15/2020.  [facility] pt is due for a follow up visit in April.  Please schedule her with Dr. Beryle Flock when she calls back.    Thanks,   -Vernona Rieger

## 2020-09-10 ENCOUNTER — Other Ambulatory Visit: Payer: Self-pay | Admitting: Physician Assistant

## 2020-09-10 ENCOUNTER — Encounter: Payer: Self-pay | Admitting: Physician Assistant

## 2020-09-10 DIAGNOSIS — I10 Essential (primary) hypertension: Secondary | ICD-10-CM

## 2020-09-12 NOTE — Telephone Encounter (Signed)
Notes to clinic:  Appointment has not been schedule Patient was without insurance  Review for refills   Requested Prescriptions  Pending Prescriptions Disp Refills   hydrochlorothiazide (MICROZIDE) 12.5 MG capsule [Pharmacy Med Name: HYDROCHLOROTHIAZIDE 12.5MG  CAPSULES] 90 capsule 0    Sig: TAKE 1 CAPSULE(12.5 MG) BY MOUTH DAILY      Cardiovascular: Diuretics - Thiazide Failed - 09/10/2020  3:11 AM      Failed - Cr in normal range and within 360 days    Creatinine, Ser  Date Value Ref Range Status  02/29/2020 0.54 (L) 0.57 - 1.00 mg/dL Final          Failed - Valid encounter within last 6 months    Recent Outpatient Visits           6 months ago Type 2 diabetes mellitus without complication, without long-term current use of insulin (HCC)   [facility] [place], [place], [place]   1 year ago Annual physical exam   [facility] Joy[place]elyn Man [place], [place]   2 years ago Type 2 diabetes mellitus without complication, without long-term current use of insulin [facility])   [facility] [place], [place], [place]   2 years ago Type 2 diabetes mellitus without complication, without long-term current use of insulin (HCC)   [facility] [place], [place], [place]   3 years ago Essential (primary) hypertension   [employer], Victorino Dike M, [place]                Passed - Ca in normal range and within 360 days    Calcium  Date Value Ref Range Status  02/29/2020 9.6 8.7 - 10.3 mg/dL Final          Passed - K in normal range and within 360 days    Potassium  Date Value Ref Range Status  02/29/2020 4.0 3.5 - 5.2 mmol/L Final          Passed - Na in normal range and within 360 days    Sodium  Date Value Ref Range Status  02/29/2020 137 134 - 144 mmol/L Final          Passed - Last BP in normal range    BP Readings from Last 1 Encounters:  02/29/20 101/67            metoprolol tartrate (LOPRESSOR)  25 MG tablet [Pharmacy Med Name: METOPROLOL TARTRATE 25MG  TABLETS] 180 tablet 0    Sig: TAKE 1 TABLET(25 MG) BY MOUTH TWICE DAILY      Cardiovascular:  Beta Blockers Failed - 09/10/2020  3:11 AM      Failed - Valid encounter within last 6 months    Recent Outpatient Visits           6 months ago Type 2 diabetes mellitus without complication, without long-term current use of insulin [facility])   [facility] [place], [place], Alessandra Bevels   1 year ago Annual physical exam   [facility] [place], [place], [place]   2 years ago Type 2 diabetes mellitus without complication, without long-term current use of insulin [facility])   [facility] [place], [place], [place]   2 years ago Type 2 diabetes mellitus without complication, without long-term current use of insulin [facility])   [facility] [place], [place], [place]   3 years ago Essential (primary) hypertension   [facility] [place], [place], [place]  Passed - Last BP in normal range    BP Readings from Last 1 Encounters:  02/29/20 101/67          Passed - Last Heart Rate in normal range    Pulse Readings from Last 1 Encounters:  02/29/20 68

## 2020-10-12 ENCOUNTER — Encounter: Payer: Self-pay | Admitting: Physician Assistant

## 2020-10-12 ENCOUNTER — Other Ambulatory Visit: Payer: Self-pay | Admitting: Family Medicine

## 2020-10-12 DIAGNOSIS — I10 Essential (primary) hypertension: Secondary | ICD-10-CM

## 2020-10-12 NOTE — Telephone Encounter (Signed)
Patient is requesting a 90 day supply. She was last seen over 6 months ago. Wasn't sure if you would approve 90 day or 30 since an additional was already sent in a few months ago. I did replied back to the patient letting her know that she is due for an office visit.

## 2020-10-12 NOTE — Telephone Encounter (Signed)
Can do 30 day supply until she is seen

## 2020-10-12 NOTE — Telephone Encounter (Signed)
Requested medications are due for refill today.  yes  Requested medications are on the active medications list.  yes  Last refill. 09/12/2020  Future visit scheduled.   no  Notes to clinic.  Courtesy refill already given.

## 2020-10-13 MED ORDER — HYDROCHLOROTHIAZIDE 12.5 MG PO CAPS: 12.5000 mg | ORAL_CAPSULE | Freq: Every day | ORAL | 0 refills | Status: DC

## 2020-10-13 NOTE — Telephone Encounter (Signed)
Medication sent. Patient advised via mychart.

## 2020-11-12 ENCOUNTER — Other Ambulatory Visit: Payer: Self-pay | Admitting: Family Medicine

## 2020-11-12 DIAGNOSIS — I10 Essential (primary) hypertension: Secondary | ICD-10-CM

## 2020-11-12 NOTE — Telephone Encounter (Signed)
Last RF 10/13/20 #30 day courtesy RF. Pt has a upcoming appt 01/16/21. Pt has already received 2 other courtesy RF's. Please advise Requested Prescriptions  Pending Prescriptions Disp Refills   hydrochlorothiazide (MICROZIDE) 12.5 MG capsule [Pharmacy Med Name: HYDROCHLOROTHIAZIDE 12.5MG  CAPSULES] 30 capsule 0    Sig: TAKE 1 CAPSULE(12.5 MG) BY MOUTH DAILY      Cardiovascular: Diuretics - Thiazide Failed - 11/12/2020  8:44 AM      Failed - Cr in normal range and within 360 days    Creatinine, Ser  Date Value Ref Range Status  02/29/2020 0.54 (L) 0.57 - 1.00 mg/dL Final          Failed - Valid encounter within last 6 months    Recent Outpatient Visits           8 months ago Type 2 diabetes mellitus without complication, without long-term current use of insulin [facility])   [facility] [place], [place], [place]   1 year ago Annual physical exam   [facility] Joy[place]elyn [place], [place]   2 years ago Type 2 diabetes mellitus without complication, without long-term current use of insulin [facility])   [facility] [place], [place], [place]   2 years ago Type 2 diabetes mellitus without complication, without long-term current use of insulin [facility])   [facility] [place], [place], [place]   3 years ago Essential (primary) hypertension   [employer], Alessandra [place], PA-C       Future Appointments             In 2 months Bacigalupo, Marzella Schlein, MD [facility], [facility]             Passed - Ca in normal range and within 360 days    Calcium  Date Value Ref Range Status  02/29/2020 9.6 8.7 - 10.3 mg/dL Final          Passed - K in normal range and within 360 days    Potassium  Date Value Ref Range Status  02/29/2020 4.0 3.5 - 5.2 mmol/L Final          Passed - Na in normal range and within 360 days    Sodium  Date Value Ref Range Status  02/29/2020 137 134 - 144 mmol/L Final          Passed  - Last BP in normal range    BP Readings from Last 1 Encounters:  02/29/20 101/67

## 2020-11-14 NOTE — Telephone Encounter (Signed)
Has had 2 courtesy refills. Needs sooner appt with any provider in the office

## 2020-11-15 ENCOUNTER — Encounter: Payer: Self-pay | Admitting: Family Medicine

## 2020-11-15 DIAGNOSIS — I10 Essential (primary) hypertension: Secondary | ICD-10-CM

## 2020-11-15 MED ORDER — HYDROCHLOROTHIAZIDE 12.5 MG PO CAPS: ORAL_CAPSULE | ORAL | 1 refills | Status: DC

## 2020-11-25 ENCOUNTER — Other Ambulatory Visit: Payer: Self-pay | Admitting: Physician Assistant

## 2020-11-25 DIAGNOSIS — E1165 Type 2 diabetes mellitus with hyperglycemia: Secondary | ICD-10-CM

## 2020-11-28 ENCOUNTER — Encounter: Payer: Self-pay | Admitting: Family Medicine

## 2020-11-28 DIAGNOSIS — E1165 Type 2 diabetes mellitus with hyperglycemia: Secondary | ICD-10-CM

## 2020-11-29 NOTE — Telephone Encounter (Signed)
Please send refill as requested

## 2020-12-02 MED ORDER — GLIPIZIDE ER 10 MG PO TB24: 10.0000 mg | ORAL_TABLET | Freq: Every day | ORAL | 0 refills | Status: DC

## 2020-12-06 ENCOUNTER — Telehealth: Payer: Self-pay

## 2020-12-06 DIAGNOSIS — I10 Essential (primary) hypertension: Secondary | ICD-10-CM

## 2020-12-06 MED ORDER — METOPROLOL TARTRATE 25 MG PO TABS: ORAL_TABLET | ORAL | 0 refills | Status: DC

## 2020-12-06 NOTE — Telephone Encounter (Signed)
Last office visit to address HTN 02/29/2020, patient has a scheduled follow up on 01/16/2021, labs last drawn on 02/29/20. Will refill enough to last till appt. KW

## 2020-12-06 NOTE — Telephone Encounter (Signed)
[facility] faxed refill request for the following medications:  metoprolol tartrate (LOPRESSOR) 25 MG tablet  Last Rx: 09/12/20 NOV: /29/22 With Dr. Leonard Schwartz Please advise. Thanks TNP

## 2021-01-14 ENCOUNTER — Other Ambulatory Visit: Payer: Self-pay | Admitting: Family Medicine

## 2021-01-14 DIAGNOSIS — I10 Essential (primary) hypertension: Secondary | ICD-10-CM

## 2021-01-14 NOTE — Telephone Encounter (Signed)
Must be seen in office for more refills- has appt 01/16/21

## 2021-01-16 ENCOUNTER — Ambulatory Visit (INDEPENDENT_AMBULATORY_CARE_PROVIDER_SITE_OTHER): Payer: Self-pay | Admitting: Family Medicine

## 2021-01-16 ENCOUNTER — Other Ambulatory Visit: Payer: Self-pay

## 2021-01-16 ENCOUNTER — Encounter: Payer: Self-pay | Admitting: Family Medicine

## 2021-01-16 VITALS — BP 133/71 | HR 84 | Temp 98.6°F | Ht 60.0 in | Wt 208.8 lb

## 2021-01-16 DIAGNOSIS — F3341 Major depressive disorder, recurrent, in partial remission: Secondary | ICD-10-CM

## 2021-01-16 DIAGNOSIS — E1159 Type 2 diabetes mellitus with other circulatory complications: Secondary | ICD-10-CM

## 2021-01-16 DIAGNOSIS — I152 Hypertension secondary to endocrine disorders: Secondary | ICD-10-CM

## 2021-01-16 DIAGNOSIS — E1169 Type 2 diabetes mellitus with other specified complication: Secondary | ICD-10-CM

## 2021-01-16 DIAGNOSIS — E782 Mixed hyperlipidemia: Secondary | ICD-10-CM

## 2021-01-16 DIAGNOSIS — E119 Type 2 diabetes mellitus without complications: Secondary | ICD-10-CM

## 2021-01-16 DIAGNOSIS — Z6841 Body Mass Index (BMI) 40.0 and over, adult: Secondary | ICD-10-CM

## 2021-01-16 DIAGNOSIS — F411 Generalized anxiety disorder: Secondary | ICD-10-CM

## 2021-01-16 DIAGNOSIS — I10 Essential (primary) hypertension: Secondary | ICD-10-CM

## 2021-01-16 DIAGNOSIS — E785 Hyperlipidemia, unspecified: Secondary | ICD-10-CM

## 2021-01-16 DIAGNOSIS — E1165 Type 2 diabetes mellitus with hyperglycemia: Secondary | ICD-10-CM

## 2021-01-16 MED ORDER — METFORMIN HCL 500 MG PO TABS: 500.0000 mg | ORAL_TABLET | Freq: Two times a day (BID) | ORAL | 3 refills | Status: DC

## 2021-01-16 MED ORDER — LISINOPRIL 20 MG PO TABS: 20.0000 mg | ORAL_TABLET | Freq: Every day | ORAL | 3 refills | Status: DC

## 2021-01-16 MED ORDER — BUPROPION HCL ER (XL) 150 MG PO TB24: 150.0000 mg | ORAL_TABLET | Freq: Every day | ORAL | 5 refills | Status: DC

## 2021-01-16 MED ORDER — HYDROCHLOROTHIAZIDE 12.5 MG PO CAPS: ORAL_CAPSULE | ORAL | 1 refills | Status: DC

## 2021-01-16 MED ORDER — GLIPIZIDE ER 10 MG PO TB24: 10.0000 mg | ORAL_TABLET | Freq: Every day | ORAL | 1 refills | Status: DC

## 2021-01-16 MED ORDER — SIMVASTATIN 10 MG PO TABS: 10.0000 mg | ORAL_TABLET | Freq: Every day | ORAL | 3 refills | Status: DC

## 2021-01-16 NOTE — Assessment & Plan Note (Addendum)
-   Chronic, previously stable - Recheck A1c & urine microalbumin - Continue Glipizide & Metformin - Refer for annual eye exam; will request records of last year's eye exam

## 2021-01-16 NOTE — Assessment & Plan Note (Signed)
-   Chronic and stable - Start Wellbutrin - Discussed outpatient therapy referral - pt. declines referral at this time, but states that she will request resources if needed at a later time - Continue Xanax prn for anxiety attacks 

## 2021-01-16 NOTE — Progress Notes (Signed)
Established patient visit   Patient: Dana Wallace   DOB: 05-01-1956   65 y.o. Female  MRN: [id number] Visit Date: 01/16/2021  Today's healthcare provider: Lavon Paganini, MD   Chief Complaint  Patient presents with   Annual Exam   Subjective    HPI  Type 2 Diabetes Mellitus - Currently taking Glipizide & Metformin - Pt. reports feeling "sluggish" with Metformin - Reports that BG at home 140-160s - Denies episodes of hypoglycemia  Hypertension - Currently taking HCTZ & Metoprolol - Denies chest pain, SOB, leg swelling, headaches, & vision changes  Hyperlipidemia - Currently taking Simvastatin, denies myalgias  Depression and Anxiety - Pt. reports that she uses Xanax very sparingly - hasn't used it in months - She states that she has never tried other medications - Reports trauma history, uses Xanax for acute anxiety attacks/flash backs - Pt. has been using coping skills, including spending times outdoors and writing in her journal, which provide relief - Pt. previously saw an outpatient therapist, but felt that it was not a "good fit"   Discoloration of R anterior ankle - Pt. states that she had a fall in December & developed a bruise, which hasn't resolved since Dec - Denies tenderness to palpation, fevers, swelling, & erythema  Obesity - Pt. desires weight loss medications - She previously tried Phentermine, but felt poor due to cardiac side effects - Previously tried Ozempic, but can no longer afford it since losing her insurance    Medications: Outpatient Medications Prior to Visit  Medication Sig   ALPRAZolam (XANAX) 0.5 MG tablet TAKE 1/2 TO 1 TABLET BY MOUTH TWICE DAILY AS NEEDED   aspirin 81 MG tablet Take by mouth.   Blood Glucose Monitoring Suppl (ACCU-CHEK AVIVA PLUS) w/Device KIT To check blood glucose once daily   glucose blood (ACCU-CHEK AVIVA PLUS) test strip To check blood glucose once daily   ibuprofen (ADVIL,MOTRIN) 200 MG tablet     Lancets (ACCU-CHEK MULTICLIX) lancets To check blood glucose once daily   mometasone (ELOCON) 0.1 % cream    Multiple Vitamin tablet    Omega-3 Fatty Acids (FISH OIL) 1200 MG CPDR Take by mouth daily.   [DISCONTINUED] glipiZIDE (GLUCOTROL XL) 10 MG 24 hr tablet Take 1 tablet (10 mg total) by mouth daily with breakfast.   [DISCONTINUED] hydrochlorothiazide (MICROZIDE) 12.5 MG capsule TAKE 1 CAPSULE(12.5 MG) BY MOUTH DAILY   [DISCONTINUED] metFORMIN (GLUCOPHAGE) 500 MG tablet Take 1 tablet (500 mg total) by mouth 2 (two) times daily with a meal.   [DISCONTINUED] metoprolol tartrate (LOPRESSOR) 25 MG tablet TAKE 1 TABLET(25 MG) BY MOUTH TWICE DAILY   [DISCONTINUED] simvastatin (ZOCOR) 10 MG tablet TAKE 1 TABLET(10 MG) BY MOUTH AT BEDTIME   No facility-administered medications prior to visit.    Review of Systems  Constitutional:  Positive for fatigue. Negative for activity change, appetite change and fever.  HENT: Negative.    Eyes:  Negative for visual disturbance.  Respiratory: Negative.  Negative for chest tightness and shortness of breath.   Cardiovascular: Negative.  Negative for chest pain and leg swelling.  Gastrointestinal: Negative.   Endocrine: Negative.  Negative for polydipsia and polyuria.  Genitourinary: Negative.   Musculoskeletal:  Positive for arthralgias.  Skin:  Positive for color change.       ~3 cm patch of dusky discoloration on R anterior ankle; no associated edema, warmth, or erythema  Neurological:  Negative for dizziness, weakness and light-headedness.  Psychiatric/Behavioral: Negative.  Objective    BP 133/71 (BP Location: Left Arm, Patient Position: Sitting, Cuff Size: Large)   Pulse 84   Temp 98.6 F (37 C) (Oral)   Ht 5' (1.524 m)   Wt 208 lb 12.8 oz (94.7 kg)   SpO2 98%   BMI 40.78 kg/m      Physical Exam Constitutional:      General: She is not in acute distress.    Appearance: Normal appearance. She is obese.  HENT:     Head:  Normocephalic and atraumatic.     Right Ear: External ear normal.     Left Ear: External ear normal.  Eyes:     Conjunctiva/sclera: Conjunctivae normal.  Cardiovascular:     Rate and Rhythm: Normal rate and regular rhythm.     Pulses: Normal pulses.     Heart sounds: Normal heart sounds.  Pulmonary:     Effort: Pulmonary effort is normal.     Breath sounds: Normal breath sounds.  Abdominal:     General: Bowel sounds are normal.     Palpations: Abdomen is soft.  Musculoskeletal:        General: No swelling or tenderness.  Skin:    General: Skin is warm and dry.     Comments: ~3 cm patch of dusky discoloration on R anterior ankle; no associated edema, erythema, or warmth; consistent with post-traumatic inflammatory skin changes  Neurological:     Mental Status: She is alert.  Psychiatric:        Behavior: Behavior normal.        Thought Content: Thought content normal.     No results found for any visits on 01/16/21.  Assessment & Plan     Problem List Items Addressed This Visit       Cardiovascular and Mediastinum   Hypertension associated with diabetes ([place])    - Chronic and well-controlled - Continue HCTZ - Discontinue Metoprolol - Start Lisinopril for kidney protection & BP management - Recheck BMP      Relevant Medications   lisinopril (ZESTRIL) 20 MG tablet   hydrochlorothiazide (MICROZIDE) 12.5 MG capsule   glipiZIDE (GLUCOTROL XL) 10 MG 24 hr tablet   metFORMIN (GLUCOPHAGE) 500 MG tablet   simvastatin (ZOCOR) 10 MG tablet   Other Relevant Orders   Basic Metabolic Panel (BMET)     Endocrine   Diabetes mellitus ([place]) - Primary    - Chronic, previously stable - Recheck A1c & urine microalbumin - Continue Glipizide & Metformin - Refer for annual eye exam; will request records of last year's eye exam - Foot exam WNL today      Relevant Medications   lisinopril (ZESTRIL) 20 MG tablet   glipiZIDE (GLUCOTROL XL) 10 MG 24 hr tablet   metFORMIN (GLUCOPHAGE)  500 MG tablet   simvastatin (ZOCOR) 10 MG tablet   Other Relevant Orders   Hemoglobin A1c   Hyperlipidemia associated with type 2 diabetes mellitus (HCC)    - Chronic, previously stable - Continue Simvastatin - Will defer lipid panel at this time       Relevant Medications   lisinopril (ZESTRIL) 20 MG tablet   glipiZIDE (GLUCOTROL XL) 10 MG 24 hr tablet   metFORMIN (GLUCOPHAGE) 500 MG tablet   simvastatin (ZOCOR) 10 MG tablet     Other   GAD (generalized anxiety disorder)    - Chronic and stable - Start Wellbutrin - Discussed outpatient therapy referral - pt. declines referral at this time, but states that she will  request resources if needed at a later time - Continue Xanax prn for anxiety attacks      Relevant Medications   buPROPion (WELLBUTRIN XL) 150 MG 24 hr tablet   MDD (major depressive disorder)    - Chronic and stable - Start Wellbutrin - Discussed outpatient therapy referral - pt. declines referral at this time, but states that she will request resources if needed at a later time - Continue Xanax prn for anxiety attacks      Relevant Medications   buPROPion (WELLBUTRIN XL) 150 MG 24 hr tablet   Morbid obesity (H[facility])    - Chronic obesity associated with HTN, DM, and HLD - Discussed diet and exercise - Discussed importance of maintaining healthy weight - Start Wellbutrin for weight loss - Recheck A1c      Relevant Medications   glipiZIDE (GLUCOTROL XL) 10 MG 24 hr tablet   metFORMIN (GLUCOPHAGE) 500 MG tablet   Other Visit Diagnoses     BMI 40.0-44.9, adult ([place])       - Chronic obesity associated with HTN, DM, and HLD - Discussed diet and exercise - Discussed importance of maintaining healthy weight - Start Wellbutrin for weight loss - Recheck A1c    Relevant Medications   glipiZIDE (GLUCOTROL XL) 10 MG 24 hr tablet   metFORMIN (GLUCOPHAGE) 500 MG tablet   Essential hypertension       - Chronic and well-controlled - Continue HCTZ - Discontinue  Metoprolol - Start Lisinopril for kidney protection & BP management - Recheck BMP    Relevant Medications   lisinopril (ZESTRIL) 20 MG tablet   hydrochlorothiazide (MICROZIDE) 12.5 MG capsule   simvastatin (ZOCOR) 10 MG tablet        Return in about 3 months (around 04/18/2021) for chronic disease f/u, With new PCP.      Percell Locus, MS3   Patient seen along with MS3 student Percell Locus. I personally evaluated this patient along with the student, and verified all aspects of the history, physical exam, and medical decision making as documented by the student. I agree with the student's documentation and have made all necessary edits.  Allura Doepke, Dionne Bucy, MD, MPH [facility]

## 2021-01-16 NOTE — Assessment & Plan Note (Signed)
-   Chronic and stable - Start Wellbutrin - Discussed outpatient therapy referral - pt. declines referral at this time, but states that she will request resources if needed at a later time - Continue Xanax prn for anxiety attacks

## 2021-01-16 NOTE — Assessment & Plan Note (Signed)
-   Chronic obesity associated with HTN, DM, and HLD - Discussed diet and exercise - Discussed importance of maintaining healthy weight - Start Wellbutrin for weight loss - Recheck A1c

## 2021-01-16 NOTE — Assessment & Plan Note (Signed)
-   Chronic and well-controlled - Continue HCTZ - Discontinue Metoprolol - Start Lisinopril for kidney protection & BP management - Recheck CMP

## 2021-01-16 NOTE — Assessment & Plan Note (Signed)
-   Chronic, previously stable - Continue Simvastatin - Will defer lipid panel at this time

## 2021-01-17 LAB — BASIC METABOLIC PANEL
BUN/Creatinine Ratio: 18 (ref 12–28)
BUN: 10 mg/dL (ref 8–27)
CO2: 26 mmol/L (ref 20–29)
Calcium: 9.6 mg/dL (ref 8.7–10.3)
Chloride: 98 mmol/L (ref 96–106)
Creatinine, Ser: 0.57 mg/dL (ref 0.57–1.00)
Glucose: 127 mg/dL — ABNORMAL HIGH (ref 65–99)
Potassium: 4.4 mmol/L (ref 3.5–5.2)
Sodium: 141 mmol/L (ref 134–144)
eGFR: 102 mL/min/{1.73_m2} (ref >59)

## 2021-01-17 LAB — HEMOGLOBIN A1C
Est. average glucose Bld gHb Est-mCnc: 232 mg/dL
Hgb A1c MFr Bld: 9.7 % — ABNORMAL HIGH (ref 4.8–5.6)

## 2021-01-18 ENCOUNTER — Telehealth: Payer: Self-pay | Admitting: *Deleted

## 2021-01-18 ENCOUNTER — Other Ambulatory Visit: Payer: Self-pay | Admitting: Family Medicine

## 2021-01-18 DIAGNOSIS — I10 Essential (primary) hypertension: Secondary | ICD-10-CM

## 2021-01-18 NOTE — Telephone Encounter (Signed)
Requested medications are due for refill today.  Unknown  Requested medications are on the active medications list.  no  Last refill. 12/06/2020  Future visit scheduled.   yes  Notes to clinic.  Medication was discontinued 01/16/2021

## 2021-01-18 NOTE — Telephone Encounter (Signed)
Patient returned call for lab results- agent contacted office- no nurse available in office to give result and asked agent if [facility] nurse could give results. Results relayed per provider instructions:  Normal labs, except high a1c. Recommend increasing metformin to 1000mg  bid. If unable to tolerate that, can add actos 30mg  daily to current needs. Needs 3 month follow-up to recheck  Patient states she would like to do the increased dosing of the Metformin. Patient will contact office if she has trouble tolerating that dose. She states she does not need RF now- she will contact office if she is doing well for adjustment to Rx.Patient states she has a follow up appointment in December already scheduled.

## 2021-03-21 ENCOUNTER — Encounter: Payer: Self-pay | Admitting: Family Medicine

## 2021-04-05 ENCOUNTER — Encounter: Payer: Self-pay | Admitting: Family Medicine

## 2021-04-05 DIAGNOSIS — E119 Type 2 diabetes mellitus without complications: Secondary | ICD-10-CM

## 2021-04-07 MED ORDER — METFORMIN HCL 500 MG PO TABS: 1000.0000 mg | ORAL_TABLET | Freq: Two times a day (BID) | ORAL | 1 refills | Status: DC

## 2021-04-18 ENCOUNTER — Other Ambulatory Visit: Payer: Self-pay | Admitting: Family Medicine

## 2021-04-19 ENCOUNTER — Telehealth: Payer: Self-pay

## 2021-04-19 NOTE — Telephone Encounter (Signed)
Copied from CRM [id number]. Topic: General - Other >> Apr 19, 2021  8:45 AM Maye Hides wrote: Reason for CRM: Pt had to cancel her appointment on 04/25/21 because she has an appointment conflict.She wants to know if she can still do her lab work.Please advise pt

## 2021-04-20 NOTE — Telephone Encounter (Signed)
Left detailed message advising as below. 

## 2021-04-20 NOTE — Telephone Encounter (Signed)
No lab work without an appointment.  Looks like she is Elise's patient, so she can be rescheduled with her when she does not have a conflict.

## 2021-04-25 ENCOUNTER — Ambulatory Visit: Payer: Self-pay | Admitting: Family Medicine

## 2021-05-17 ENCOUNTER — Other Ambulatory Visit: Payer: Self-pay | Admitting: Family Medicine

## 2021-05-23 ENCOUNTER — Ambulatory Visit (INDEPENDENT_AMBULATORY_CARE_PROVIDER_SITE_OTHER): Payer: Self-pay | Admitting: Family Medicine

## 2021-05-23 ENCOUNTER — Encounter: Payer: Self-pay | Admitting: Family Medicine

## 2021-05-23 ENCOUNTER — Other Ambulatory Visit: Payer: Self-pay

## 2021-05-23 VITALS — BP 113/55 | HR 80 | Resp 16 | Wt 201.3 lb

## 2021-05-23 DIAGNOSIS — L819 Disorder of pigmentation, unspecified: Secondary | ICD-10-CM | POA: Insufficient documentation

## 2021-05-23 DIAGNOSIS — E1165 Type 2 diabetes mellitus with hyperglycemia: Secondary | ICD-10-CM

## 2021-05-23 DIAGNOSIS — I152 Hypertension secondary to endocrine disorders: Secondary | ICD-10-CM

## 2021-05-23 DIAGNOSIS — E785 Hyperlipidemia, unspecified: Secondary | ICD-10-CM

## 2021-05-23 DIAGNOSIS — E1159 Type 2 diabetes mellitus with other circulatory complications: Secondary | ICD-10-CM

## 2021-05-23 DIAGNOSIS — E1169 Type 2 diabetes mellitus with other specified complication: Secondary | ICD-10-CM

## 2021-05-23 LAB — POCT GLYCOSYLATED HEMOGLOBIN (HGB A1C): Hemoglobin A1C: 6.6 % — AB (ref 4.0–5.6)

## 2021-05-23 MED ORDER — HYDROCHLOROTHIAZIDE 12.5 MG PO CAPS: ORAL_CAPSULE | ORAL | 3 refills | Status: DC

## 2021-05-23 MED ORDER — GLIPIZIDE ER 10 MG PO TB24: 10.0000 mg | ORAL_TABLET | Freq: Every day | ORAL | 3 refills | Status: DC

## 2021-05-23 MED ORDER — LISINOPRIL 20 MG PO TABS: 20.0000 mg | ORAL_TABLET | Freq: Every day | ORAL | 3 refills | Status: DC

## 2021-05-23 MED ORDER — METFORMIN HCL 1000 MG PO TABS: 1000.0000 mg | ORAL_TABLET | Freq: Two times a day (BID) | ORAL | 3 refills | Status: DC

## 2021-05-23 NOTE — Assessment & Plan Note (Signed)
Chronic, stable Well Controlled On HCTZ and ACEi given DM Has lost 7# since last visit

## 2021-05-23 NOTE — Assessment & Plan Note (Signed)
Wishes to continue trial of wellbutrin to assist with binge eating component of diet Has noticed small difference since starting; 7# weight loss noted BMI remains 39.31 HTN, HLD, DM

## 2021-05-23 NOTE — Assessment & Plan Note (Signed)
Defer lipid panel at this time d/t cost Remains on statin Purposeful weight loss; celebrated success through the holiday

## 2021-05-23 NOTE — Progress Notes (Signed)
Established patient visit   Patient: Dana Wallace   DOB: [birth year]   66 y.o. Female  MRN: [id number] Visit Date: 05/23/2021  Today's healthcare provider: Gwyneth Sprout, FNP   Chief Complaint  Patient presents with   Diabetes   Hypertension   Hyperlipidemia   Subjective    HPI  Diabetes Mellitus Type II, follow-up  Lab Results  Component Value Date   HGBA1C 6.6 (A) 05/23/2021   HGBA1C 9.7 (H) 01/16/2021   HGBA1C 8.3 (H) 02/29/2020   Last seen for diabetes 3 months ago.  Management since then includes continuing the same treatment. Increasing metformin to 2x/day. She reports excellent compliance with treatment. She is not having side effects.   Home blood sugar records: fasting range: 85-128, high 160  Episodes of hypoglycemia? No    Current insulin regiment: none Most Recent Eye Exam: >12 months; discussed importance of going for yearly screening given hx of DM and HTN. Reports black floaters, which she's had for years- prior to dx of DM. Remain unchanged.   --------------------------------------------------------------------------------------------------- Hypertension, follow-up  BP Readings from Last 3 Encounters:  05/23/21 (!) 113/55  01/16/21 133/71  02/29/20 101/67   Wt Readings from Last 3 Encounters:  05/23/21 201 lb 4.8 oz (91.3 kg)  01/16/21 208 lb 12.8 oz (94.7 kg)  02/29/20 214 lb (97.1 kg)     She was last seen for hypertension 3 months ago.  BP at that visit was 137/71. Management since that visit includes d/c metoprolol and start Lisinopril. Doing well on new medication. She reports excellent compliance with treatment. She is not having side effects.  She is exercising. She is not adherent to low salt diet.   Outside blood pressures are not being checked.  She does not smoke.  Use of agents associated with hypertension: none.    --------------------------------------------------------------------------------------------------- Lipid/Cholesterol, follow-up  Last Lipid Panel: Lab Results  Component Value Date   CHOL 149 02/29/2020   LDLCALC 78 02/29/2020   HDL 49 02/29/2020   TRIG 126 02/29/2020    She was last seen for this 3 months ago.  Management since that visit includes none.  She reports good compliance with treatment. She is not having side effects.   Symptoms: Yes appetite changes No foot ulcerations  No chest pain No chest pressure/discomfort  No dyspnea No orthopnea  No fatigue No lower extremity edema  No palpitations No paroxysmal nocturnal dyspnea  No nausea No numbness or tingling of extremity  No polydipsia No polyuria  No speech difficulty No syncope   She is following a Regular diet. Using Wellbutrin to assist with decrease in carbs in diet. Current exercise: walking  Last metabolic panel Lab Results  Component Value Date   GLUCOSE 127 (H) 01/16/2021   NA 141 01/16/2021   K 4.4 01/16/2021   BUN 10 01/16/2021   CREATININE 0.57 01/16/2021   EGFR 102 01/16/2021   GFRNONAA 101 02/29/2020   CALCIUM 9.6 01/16/2021   AST 27 02/29/2020   ALT 25 02/29/2020   The 10-year ASCVD risk score (Arnett DK, et al., 2019) is: 7.9%  ---------------------------------------------------------------------------------------------------   Medications: Outpatient Medications Prior to Visit  Medication Sig   ALPRAZolam (XANAX) 0.5 MG tablet TAKE 1/2 TO 1 TABLET BY MOUTH TWICE DAILY AS NEEDED   aspirin 81 MG tablet Take by mouth.   Blood Glucose Monitoring Suppl (ACCU-CHEK AVIVA PLUS) w/Device KIT To check blood glucose once daily   buPROPion (WELLBUTRIN XL) 150 MG 24 hr  tablet Take 1 tablet (150 mg total) by mouth daily.   glucose blood (ACCU-CHEK AVIVA PLUS) test strip To check blood glucose once daily   ibuprofen (ADVIL,MOTRIN) 200 MG tablet    Lancets (ACCU-CHEK MULTICLIX) lancets To  check blood glucose once daily   Multiple Vitamin tablet    Omega-3 Fatty Acids (FISH OIL) 1200 MG CPDR Take by mouth daily.   simvastatin (ZOCOR) 10 MG tablet Take 1 tablet (10 mg total) by mouth daily at 6 PM.   [DISCONTINUED] glipiZIDE (GLUCOTROL XL) 10 MG 24 hr tablet Take 1 tablet (10 mg total) by mouth daily with breakfast.   [DISCONTINUED] hydrochlorothiazide (MICROZIDE) 12.5 MG capsule TAKE 1 CAPSULE(12.5 MG) BY MOUTH DAILY   [DISCONTINUED] lisinopril (ZESTRIL) 20 MG tablet TAKE 1 TABLET(20 MG) BY MOUTH DAILY   [DISCONTINUED] metFORMIN (GLUCOPHAGE) 500 MG tablet Take 2 tablets (1,000 mg total) by mouth 2 (two) times daily with a meal.   [DISCONTINUED] mometasone (ELOCON) 0.1 % cream    No facility-administered medications prior to visit.    Review of Systems     Objective    BP (!) 113/55    Pulse 80    Resp 16    Wt 201 lb 4.8 oz (91.3 kg)    SpO2 99%    BMI 39.31 kg/m    Physical Exam Vitals and nursing note reviewed.  Constitutional:      General: She is not in acute distress.    Appearance: Normal appearance. She is obese. She is not ill-appearing, toxic-appearing or diaphoretic.  HENT:     Head: Normocephalic and atraumatic.  Cardiovascular:     Rate and Rhythm: Normal rate and regular rhythm.     Pulses: Normal pulses.     Heart sounds: Normal heart sounds. No murmur heard.   No friction rub. No gallop.  Pulmonary:     Effort: Pulmonary effort is normal. No respiratory distress.     Breath sounds: Normal breath sounds. No stridor. No wheezing, rhonchi or rales.  Chest:     Chest wall: No tenderness.  Abdominal:     General: Bowel sounds are normal. There is no distension.     Palpations: Abdomen is soft.     Tenderness: There is no abdominal tenderness. There is no guarding.  Musculoskeletal:        General: No swelling, tenderness, deformity or signs of injury. Normal range of motion.     Right lower leg: No edema.     Left lower leg: No edema.  Skin:     General: Skin is warm and dry.     Capillary Refill: Capillary refill takes less than 2 seconds.     Coloration: Skin is not jaundiced or pale.     Findings: No bruising, erythema, lesion or rash.          Comments: Discoloration remains on R shin, has not changed since initial injury 04/2020  Neurological:     General: No focal deficit present.     Mental Status: She is alert and oriented to person, place, and time. Mental status is at baseline.     Cranial Nerves: No cranial nerve deficit.     Sensory: No sensory deficit.     Motor: No weakness.     Coordination: Coordination normal.  Psychiatric:        Mood and Affect: Mood normal.        Behavior: Behavior normal.        Thought Content: Thought content  normal.        Judgment: Judgment normal.     Results for orders placed or performed in visit on 05/23/21  POCT glycosylated hemoglobin (Hb A1C)  Result Value Ref Range   Hemoglobin A1C 6.6 (A) 4.0 - 5.6 %   HbA1c POC (<> result, manual entry)     HbA1c, POC (prediabetic range)     HbA1c, POC (controlled diabetic range)      Assessment & Plan     Problem List Items Addressed This Visit       Cardiovascular and Mediastinum   Hypertension associated with diabetes ([place])    Chronic, stable Well Controlled On HCTZ and ACEi given DM Has lost 7# since last visit      Relevant Medications   glipiZIDE (GLUCOTROL XL) 10 MG 24 hr tablet   hydrochlorothiazide (MICROZIDE) 12.5 MG capsule   lisinopril (ZESTRIL) 20 MG tablet   metFORMIN (GLUCOPHAGE) 1000 MG tablet     Endocrine   Hyperlipidemia associated with type 2 diabetes mellitus ([place])    Defer lipid panel at this time d/t cost Remains on statin Purposeful weight loss; celebrated success through the holiday       Relevant Medications   glipiZIDE (GLUCOTROL XL) 10 MG 24 hr tablet   hydrochlorothiazide (MICROZIDE) 12.5 MG capsule   lisinopril (ZESTRIL) 20 MG tablet   metFORMIN (GLUCOPHAGE) 1000 MG tablet    RESOLVED: Diabetes mellitus ([facility]) - Primary   Relevant Medications   glipiZIDE (GLUCOTROL XL) 10 MG 24 hr tablet   lisinopril (ZESTRIL) 20 MG tablet   metFORMIN (GLUCOPHAGE) 1000 MG tablet   Other Relevant Orders   POCT glycosylated hemoglobin (Hb A1C) (Completed)     Musculoskeletal and Integument   Discoloration of skin of lower leg     Other   Morbid obesity (H[facility])    Wishes to continue trial of wellbutrin to assist with binge eating component of diet Has noticed small difference since starting; 7# weight loss noted BMI remains 39.31 HTN, HLD, DM      Relevant Medications   glipiZIDE (GLUCOTROL XL) 10 MG 24 hr tablet   metFORMIN (GLUCOPHAGE) 1000 MG tablet     Return in about 6 months (around 11/20/2021) for annual examination.      Vonna Kotyk, FNP, have reviewed all documentation for this visit. The documentation on 05/23/21 for the exam, diagnosis, procedures, and orders are all accurate and complete.    Gwyneth Sprout, [place] [phone] (phone) [phone] (fax)  [place]

## 2021-05-24 ENCOUNTER — Encounter: Payer: Self-pay | Admitting: Family Medicine

## 2021-05-24 ENCOUNTER — Other Ambulatory Visit: Payer: Self-pay | Admitting: Family Medicine

## 2021-05-24 DIAGNOSIS — I152 Hypertension secondary to endocrine disorders: Secondary | ICD-10-CM

## 2021-05-24 DIAGNOSIS — E1159 Type 2 diabetes mellitus with other circulatory complications: Secondary | ICD-10-CM

## 2021-05-24 DIAGNOSIS — E1165 Type 2 diabetes mellitus with hyperglycemia: Secondary | ICD-10-CM

## 2021-05-24 DIAGNOSIS — E782 Mixed hyperlipidemia: Secondary | ICD-10-CM

## 2021-05-24 MED ORDER — METFORMIN HCL 1000 MG PO TABS: 1000.0000 mg | ORAL_TABLET | Freq: Two times a day (BID) | ORAL | 3 refills | Status: DC

## 2021-05-24 MED ORDER — SIMVASTATIN 10 MG PO TABS: 10.0000 mg | ORAL_TABLET | Freq: Every day | ORAL | 3 refills | Status: DC

## 2021-05-24 MED ORDER — LISINOPRIL 20 MG PO TABS: 20.0000 mg | ORAL_TABLET | Freq: Every day | ORAL | 3 refills | Status: DC

## 2021-05-24 MED ORDER — HYDROCHLOROTHIAZIDE 12.5 MG PO CAPS: ORAL_CAPSULE | ORAL | 3 refills | Status: DC

## 2021-05-24 MED ORDER — GLIPIZIDE ER 10 MG PO TB24: 10.0000 mg | ORAL_TABLET | Freq: Every day | ORAL | 3 refills | Status: DC

## 2021-06-26 ENCOUNTER — Ambulatory Visit: Payer: Self-pay | Admitting: Family Medicine

## 2021-08-09 DIAGNOSIS — R062 Wheezing: Secondary | ICD-10-CM | POA: Diagnosis not present

## 2021-08-09 DIAGNOSIS — R051 Acute cough: Secondary | ICD-10-CM | POA: Diagnosis not present

## 2022-03-23 NOTE — Progress Notes (Unsigned)
I,Sinclaire Artiga S Ivon Oelkers,acting as a Education administrator for Lavon Paganini, MD.,have documented all relevant documentation on the behalf of Lavon Paganini, MD,as directed by  Lavon Paganini, MD while in the presence of Lavon Paganini, MD.     Established patient visit   Patient: Dana Wallace   DOB: 1956-04-07   66 y.o. Female  MRN: [id number] Visit Date: 03/26/2022  Today's healthcare provider: Lavon Paganini, MD   Chief Complaint  Patient presents with   Diabetes   Hypertension   Hyperlipidemia   Subjective    HPI  Patient declined flu vaccine.  Diabetes Mellitus Type II, follow-up  Lab Results  Component Value Date   HGBA1C 6.4 (A) 03/26/2022   HGBA1C 6.6 (A) 05/23/2021   HGBA1C 9.7 (H) 01/16/2021   Last seen for diabetes 10 months ago.  Management since then includes continuing the same treatment. She reports excellent compliance with treatment. She is not having side effects.   Home blood sugar records: fasting range: 90-100s  Episodes of hypoglycemia? No    Current insulin regiment: none Most Recent Eye Exam: will schedule  --------------------------------------------------------------------------------------------------- Hypertension, follow-up  BP Readings from Last 3 Encounters:  03/26/22 115/73  05/23/21 (!) 113/55  01/16/21 133/71   Wt Readings from Last 3 Encounters:  03/26/22 195 lb 11.2 oz (88.8 kg)  05/23/21 201 lb 4.8 oz (91.3 kg)  01/16/21 208 lb 12.8 oz (94.7 kg)     She was last seen for hypertension 10 months ago.  BP at that visit was 113/55. Management since that visit includes no changes. She reports excellent compliance with treatment. She is not having side effects.  She is not exercising. She is not adherent to low salt diet.   Outside blood pressures are not being checked.  She does not smoke.  Use of agents associated with hypertension: none.    --------------------------------------------------------------------------------------------------- Lipid/Cholesterol, follow-up  Last Lipid Panel: Lab Results  Component Value Date   CHOL 149 02/29/2020   LDLCALC 78 02/29/2020   HDL 49 02/29/2020   TRIG 126 02/29/2020    She was last seen for this 10 months ago.  Management since that visit includes no changes.  She reports excellent compliance with treatment. She is not having side effects.   Symptoms: No appetite changes No foot ulcerations  No chest pain No chest pressure/discomfort  No dyspnea No orthopnea  No fatigue No lower extremity edema  No palpitations No paroxysmal nocturnal dyspnea  No nausea No numbness or tingling of extremity  No polydipsia No polyuria  No speech difficulty No syncope   She is following a Regular diet. Current exercise: none  Last metabolic panel Lab Results  Component Value Date   GLUCOSE 127 (H) 01/16/2021   NA 141 01/16/2021   K 4.4 01/16/2021   BUN 10 01/16/2021   CREATININE 0.57 01/16/2021   EGFR 102 01/16/2021   GFRNONAA 101 02/29/2020   CALCIUM 9.6 01/16/2021   AST 27 02/29/2020   ALT 25 02/29/2020   The 10-year ASCVD risk score (Arnett DK, et al., 2019) is: 9.2%  ---------------------------------------------------------------------------------------------------   Medications: Outpatient Medications Prior to Visit  Medication Sig   ALPRAZolam (XANAX) 0.5 MG tablet TAKE 1/2 TO 1 TABLET BY MOUTH TWICE DAILY AS NEEDED   aspirin 81 MG tablet Take by mouth.   Blood Glucose Monitoring Suppl (ACCU-CHEK AVIVA PLUS) w/Device KIT To check blood glucose once daily   glucose blood (ACCU-CHEK AVIVA PLUS) test strip To check blood glucose once daily   ibuprofen (  ADVIL,MOTRIN) 200 MG tablet    Lancets (ACCU-CHEK MULTICLIX) lancets To check blood glucose once daily   Multiple Vitamin tablet    Omega-3 Fatty Acids (FISH OIL) 1200 MG CPDR Take by mouth daily.   [DISCONTINUED]  buPROPion (WELLBUTRIN XL) 150 MG 24 hr tablet Take 1 tablet (150 mg total) by mouth daily.   [DISCONTINUED] glipiZIDE (GLUCOTROL XL) 10 MG 24 hr tablet Take 1 tablet (10 mg total) by mouth daily with breakfast.   [DISCONTINUED] hydrochlorothiazide (MICROZIDE) 12.5 MG capsule TAKE 1 CAPSULE(12.5 MG) BY MOUTH DAILY   [DISCONTINUED] lisinopril (ZESTRIL) 20 MG tablet Take 1 tablet (20 mg total) by mouth daily.   [DISCONTINUED] metFORMIN (GLUCOPHAGE) 1000 MG tablet Take 1 tablet (1,000 mg total) by mouth 2 (two) times daily with a meal.   [DISCONTINUED] simvastatin (ZOCOR) 10 MG tablet Take 1 tablet (10 mg total) by mouth daily at 6 PM.   No facility-administered medications prior to visit.    Review of Systems  Constitutional:  Negative for appetite change and fatigue.  Eyes:  Negative for visual disturbance.  Respiratory:  Negative for chest tightness and shortness of breath.   Cardiovascular:  Negative for chest pain, palpitations and leg swelling.  Gastrointestinal:  Negative for abdominal pain, diarrhea, nausea and vomiting.  Neurological:  Negative for headaches.       Objective    BP 115/73 (BP Location: Left Arm, Patient Position: Sitting, Cuff Size: Large)   Pulse 73   Temp 98.5 F (36.9 C) (Oral)   Resp 16   Ht 5' (1.524 m)   Wt 195 lb 11.2 oz (88.8 kg)   BMI 38.22 kg/m  BP Readings from Last 3 Encounters:  03/26/22 115/73  05/23/21 (!) 113/55  01/16/21 133/71   Wt Readings from Last 3 Encounters:  03/26/22 195 lb 11.2 oz (88.8 kg)  05/23/21 201 lb 4.8 oz (91.3 kg)  01/16/21 208 lb 12.8 oz (94.7 kg)     Physical Exam Vitals reviewed.  Constitutional:      General: She is not in acute distress.    Appearance: Normal appearance. She is well-developed. She is not diaphoretic.  HENT:     Head: Normocephalic and atraumatic.  Eyes:     General: No scleral icterus.    Conjunctiva/sclera: Conjunctivae normal.  Neck:     Thyroid: No thyromegaly.  Cardiovascular:      Rate and Rhythm: Normal rate and regular rhythm.     Pulses: Normal pulses.     Heart sounds: Normal heart sounds. No murmur heard. Pulmonary:     Effort: Pulmonary effort is normal. No respiratory distress.     Breath sounds: Normal breath sounds. No wheezing, rhonchi or rales.  Musculoskeletal:     Cervical back: Neck supple.     Right lower leg: No edema.     Left lower leg: No edema.  Lymphadenopathy:     Cervical: No cervical adenopathy.  Skin:    General: Skin is warm and dry.     Findings: No rash.  Neurological:     Mental Status: She is alert and oriented to person, place, and time. Mental status is at baseline.  Psychiatric:        Mood and Affect: Mood normal.        Behavior: Behavior normal.     Diabetic Foot Exam - Simple   Simple Foot Form Diabetic Foot exam was performed with the following findings: Yes 03/26/2022 10:47 AM  Visual Inspection No deformities, no ulcerations,  no other skin breakdown bilaterally: Yes Sensation Testing Intact to touch and monofilament testing bilaterally: Yes Pulse Check Posterior Tibialis and Dorsalis pulse intact bilaterally: Yes Comments      Results for orders placed or performed in visit on 03/26/22  POCT glycosylated hemoglobin (Hb A1C)  Result Value Ref Range   Hemoglobin A1C 6.4 (A) 4.0 - 5.6 %   Est. average glucose Bld gHb Est-mCnc 137     Assessment & Plan     Problem List Items Addressed This Visit       Cardiovascular and Mediastinum   Hypertension associated with diabetes ([place])    Well controlled Continue current medications Recheck metabolic panel F/u in 6 months       Relevant Medications   glipiZIDE (GLUCOTROL XL) 10 MG 24 hr tablet   hydrochlorothiazide (MICROZIDE) 12.5 MG capsule   lisinopril (ZESTRIL) 20 MG tablet   metFORMIN (GLUCOPHAGE) 1000 MG tablet   simvastatin (ZOCOR) 10 MG tablet   Other Relevant Orders   Comprehensive metabolic panel     Endocrine   Type 2 diabetes mellitus with  hyperglycemia, without long-term current use of insulin (HCC) - Primary    Well controlled Complicated by HTN and HLD Continue current medications UTD on vaccines,  Reminded of need for eye exam, foot exam completed today On ACEi/ARB On Statin Discussed diet and exercise F/u in 6 months       Relevant Medications   glipiZIDE (GLUCOTROL XL) 10 MG 24 hr tablet   lisinopril (ZESTRIL) 20 MG tablet   metFORMIN (GLUCOPHAGE) 1000 MG tablet   simvastatin (ZOCOR) 10 MG tablet   Other Relevant Orders   POCT glycosylated hemoglobin (Hb A1C) (Completed)   Microalbumin / creatinine urine ratio   Hyperlipidemia associated with type 2 diabetes mellitus (HCC)    Previously well controlled Continue statin Repeat FLP and CMP Goal LDL < 70      Relevant Medications   glipiZIDE (GLUCOTROL XL) 10 MG 24 hr tablet   hydrochlorothiazide (MICROZIDE) 12.5 MG capsule   lisinopril (ZESTRIL) 20 MG tablet   metFORMIN (GLUCOPHAGE) 1000 MG tablet   simvastatin (ZOCOR) 10 MG tablet     Other   Morbid obesity (HCC)    BMI 38 and assoc with HTN, T2DM, HLD Resume wellbutrin for cravings Can consider 338m dose if needed Discussed importance of healthy weight management Discussed diet and exercise       Relevant Medications   glipiZIDE (GLUCOTROL XL) 10 MG 24 hr tablet   metFORMIN (GLUCOPHAGE) 1000 MG tablet   Other Visit Diagnoses     Hypercholesterolemia with hypertriglyceridemia       Relevant Medications   hydrochlorothiazide (MICROZIDE) 12.5 MG capsule   lisinopril (ZESTRIL) 20 MG tablet   simvastatin (ZOCOR) 10 MG tablet   Other Relevant Orders   Lipid Panel With LDL/HDL Ratio   Colon cancer screening       Relevant Orders   Cologuard   Encounter for screening mammogram for malignant neoplasm of breast       Relevant Orders   MM 3D SCREEN BREAST BILATERAL        Return in about 6 months (around 09/24/2022) for chronic disease f/u.      I, ALavon Paganini MD, have reviewed all  documentation for this visit. The documentation on 03/26/22 for the exam, diagnosis, procedures, and orders are all accurate and complete.   Bacigalupo, ADionne Bucy MD, MPH [facility]

## 2022-03-26 ENCOUNTER — Ambulatory Visit: Payer: 59 | Admitting: Family Medicine

## 2022-03-26 ENCOUNTER — Encounter: Payer: Self-pay | Admitting: Family Medicine

## 2022-03-26 VITALS — BP 115/73 | HR 73 | Temp 98.5°F | Resp 16 | Ht 60.0 in | Wt 195.7 lb

## 2022-03-26 DIAGNOSIS — Z1211 Encounter for screening for malignant neoplasm of colon: Secondary | ICD-10-CM | POA: Diagnosis not present

## 2022-03-26 DIAGNOSIS — Z23 Encounter for immunization: Secondary | ICD-10-CM | POA: Diagnosis not present

## 2022-03-26 DIAGNOSIS — I152 Hypertension secondary to endocrine disorders: Secondary | ICD-10-CM

## 2022-03-26 DIAGNOSIS — E1165 Type 2 diabetes mellitus with hyperglycemia: Secondary | ICD-10-CM | POA: Diagnosis not present

## 2022-03-26 DIAGNOSIS — E1159 Type 2 diabetes mellitus with other circulatory complications: Secondary | ICD-10-CM | POA: Diagnosis not present

## 2022-03-26 DIAGNOSIS — E785 Hyperlipidemia, unspecified: Secondary | ICD-10-CM

## 2022-03-26 DIAGNOSIS — E782 Mixed hyperlipidemia: Secondary | ICD-10-CM

## 2022-03-26 DIAGNOSIS — Z1231 Encounter for screening mammogram for malignant neoplasm of breast: Secondary | ICD-10-CM

## 2022-03-26 DIAGNOSIS — E1169 Type 2 diabetes mellitus with other specified complication: Secondary | ICD-10-CM

## 2022-03-26 LAB — POCT GLYCOSYLATED HEMOGLOBIN (HGB A1C)
Est. average glucose Bld gHb Est-mCnc: 137
Hemoglobin A1C: 6.4 % — AB (ref 4.0–5.6)

## 2022-03-26 MED ORDER — LISINOPRIL 20 MG PO TABS: 20.0000 mg | ORAL_TABLET | Freq: Every day | ORAL | 3 refills | Status: DC

## 2022-03-26 MED ORDER — SIMVASTATIN 10 MG PO TABS: 10.0000 mg | ORAL_TABLET | Freq: Every day | ORAL | 3 refills | Status: DC

## 2022-03-26 MED ORDER — HYDROCHLOROTHIAZIDE 12.5 MG PO CAPS: ORAL_CAPSULE | ORAL | 3 refills | Status: DC

## 2022-03-26 MED ORDER — BUPROPION HCL ER (XL) 150 MG PO TB24: 150.0000 mg | ORAL_TABLET | Freq: Every day | ORAL | 5 refills | Status: DC

## 2022-03-26 MED ORDER — GLIPIZIDE ER 10 MG PO TB24: 10.0000 mg | ORAL_TABLET | Freq: Every day | ORAL | 3 refills | Status: DC

## 2022-03-26 MED ORDER — METFORMIN HCL 1000 MG PO TABS: 1000.0000 mg | ORAL_TABLET | Freq: Two times a day (BID) | ORAL | 3 refills | Status: DC

## 2022-03-26 NOTE — Assessment & Plan Note (Signed)
Previously well controlled Continue statin Repeat FLP and CMP Goal LDL < 70 

## 2022-03-26 NOTE — Assessment & Plan Note (Signed)
Well controlled Complicated by HTN and HLD Continue current medications UTD on vaccines,  Reminded of need for eye exam, foot exam completed today On ACEi/ARB On Statin Discussed diet and exercise F/u in 6 months

## 2022-03-26 NOTE — Addendum Note (Signed)
Addended by: Shawna Orleans on: 03/26/2022 11:18 AM   Modules accepted: Orders

## 2022-03-26 NOTE — Assessment & Plan Note (Signed)
BMI 38 and assoc with HTN, T2DM, HLD Resume wellbutrin for cravings Can consider 300mg  dose if needed Discussed importance of healthy weight management Discussed diet and exercise

## 2022-03-26 NOTE — Assessment & Plan Note (Signed)
Well controlled Continue current medications Recheck metabolic panel F/u in 6 months  

## 2022-03-28 LAB — COMPREHENSIVE METABOLIC PANEL
ALT: 19 IU/L (ref 0–32)
AST: 26 IU/L (ref 0–40)
Albumin/Globulin Ratio: 1.4 (ref 1.2–2.2)
Albumin: 4.3 g/dL (ref 3.9–4.9)
Alkaline Phosphatase: 73 IU/L (ref 44–121)
BUN/Creatinine Ratio: 15 (ref 12–28)
BUN: 10 mg/dL (ref 8–27)
Bilirubin Total: 0.6 mg/dL (ref 0.0–1.2)
CO2: 27 mmol/L (ref 20–29)
Calcium: 9.9 mg/dL (ref 8.7–10.3)
Chloride: 97 mmol/L (ref 96–106)
Creatinine, Ser: 0.67 mg/dL (ref 0.57–1.00)
Globulin, Total: 3 g/dL (ref 1.5–4.5)
Glucose: 90 mg/dL (ref 70–99)
Potassium: 4.6 mmol/L (ref 3.5–5.2)
Sodium: 140 mmol/L (ref 134–144)
Total Protein: 7.3 g/dL (ref 6.0–8.5)
eGFR: 98 mL/min/{1.73_m2} (ref >59)

## 2022-03-28 LAB — LIPID PANEL WITH LDL/HDL RATIO
Cholesterol, Total: 142 mg/dL (ref 100–199)
HDL: 51 mg/dL (ref >39)
LDL Chol Calc (NIH): 67 mg/dL (ref 0–99)
LDL/HDL Ratio: 1.3 ratio (ref 0.0–3.2)
Triglycerides: 136 mg/dL (ref 0–149)
VLDL Cholesterol Cal: 24 mg/dL (ref 5–40)

## 2022-03-28 LAB — MICROALBUMIN / CREATININE URINE RATIO
Creatinine, Urine: 77.5 mg/dL
Microalb/Creat Ratio: <4 mg/g creat (ref 0–29)
Microalbumin, Urine: <3 ug/mL

## 2022-04-02 DIAGNOSIS — Z1211 Encounter for screening for malignant neoplasm of colon: Secondary | ICD-10-CM | POA: Diagnosis not present

## 2022-04-08 LAB — COLOGUARD: COLOGUARD: NEGATIVE

## 2022-05-24 ENCOUNTER — Ambulatory Visit
Admission: RE | Admit: 2022-05-24 | Discharge: 2022-05-24 | Disposition: A | Discharge status: Home / Self Care | Payer: 59 | Source: Ambulatory Visit | Attending: Family Medicine | Admitting: Family Medicine

## 2022-05-24 DIAGNOSIS — Z1231 Encounter for screening mammogram for malignant neoplasm of breast: Secondary | ICD-10-CM | POA: Diagnosis not present

## 2022-08-29 ENCOUNTER — Encounter: Payer: Self-pay | Admitting: Family Medicine

## 2022-08-29 DIAGNOSIS — N898 Other specified noninflammatory disorders of vagina: Secondary | ICD-10-CM

## 2022-08-30 MED ORDER — FLUCONAZOLE 150 MG PO TABS: 150.0000 mg | ORAL_TABLET | Freq: Once | ORAL | 0 refills | Status: AC

## 2022-08-30 NOTE — Telephone Encounter (Signed)

## 2022-09-13 ENCOUNTER — Other Ambulatory Visit: Payer: Self-pay | Admitting: Family Medicine

## 2022-09-13 NOTE — Telephone Encounter (Signed)
Future visit in 2 weeks .  Requested Prescriptions  Pending Prescriptions Disp Refills   buPROPion (WELLBUTRIN XL) 150 MG 24 hr tablet [Pharmacy Med Name: BUPROPION HCL ER (XL) 150 MG TAB] 30 tablet 0    Sig: TAKE 1 TABLET BY MOUTH ONCE DAILY     Psychiatry: Antidepressants - bupropion Passed - 09/13/2022 10:48 AM      Passed - Cr in normal range and within 360 days    Creatinine, Ser  Date Value Ref Range Status  03/26/2022 0.67 0.57 - 1.00 mg/dL Final         Passed - AST in normal range and within 360 days    AST  Date Value Ref Range Status  03/26/2022 26 0 - 40 IU/L Final         Passed - ALT in normal range and within 360 days    ALT  Date Value Ref Range Status  03/26/2022 19 0 - 32 IU/L Final         Passed - Last BP in normal range    BP Readings from Last 1 Encounters:  03/26/22 115/73         Passed - Valid encounter within last 6 months    Recent Outpatient Visits           5 months ago Type 2 diabetes mellitus with hyperglycemia, without long-term current use of insulin (HCC)   [place] [facility] [place], Marzella Schlein, MD   1 year ago Type 2 diabetes mellitus with hyperglycemia, without long-term current use of insulin [facility])   [place] [facility] Merita Norton T, FNP   1 year ago Type 2 diabetes mellitus with other specified complication, without long-term current use of insulin [facility])   [place] [facility], Marzella Schlein, MD   2 years ago Type 2 diabetes mellitus without complication, without long-term current use of insulin [facility])   [place] [facility] [place], Alessandra Bevels, [place]   3 years ago Annual physical exam   [facility] [place], Alessandra Bevels, [place]       Future Appointments             In 2 weeks Bacigalupo, Marzella Schlein, MD [facility]

## 2022-09-25 ENCOUNTER — Ambulatory Visit: Payer: 59 | Admitting: Family Medicine

## 2022-09-28 ENCOUNTER — Encounter: Payer: Self-pay | Admitting: Family Medicine

## 2022-09-28 ENCOUNTER — Ambulatory Visit: Payer: 59 | Admitting: Family Medicine

## 2022-09-28 VITALS — BP 116/77 | HR 71 | Temp 98.0°F | Resp 12 | Ht 60.0 in | Wt 187.8 lb

## 2022-09-28 DIAGNOSIS — I152 Hypertension secondary to endocrine disorders: Secondary | ICD-10-CM

## 2022-09-28 DIAGNOSIS — E1159 Type 2 diabetes mellitus with other circulatory complications: Secondary | ICD-10-CM

## 2022-09-28 DIAGNOSIS — E782 Mixed hyperlipidemia: Secondary | ICD-10-CM

## 2022-09-28 DIAGNOSIS — E785 Hyperlipidemia, unspecified: Secondary | ICD-10-CM

## 2022-09-28 DIAGNOSIS — E1169 Type 2 diabetes mellitus with other specified complication: Secondary | ICD-10-CM

## 2022-09-28 DIAGNOSIS — Z23 Encounter for immunization: Secondary | ICD-10-CM | POA: Diagnosis not present

## 2022-09-28 DIAGNOSIS — E1165 Type 2 diabetes mellitus with hyperglycemia: Secondary | ICD-10-CM | POA: Diagnosis not present

## 2022-09-28 LAB — POCT GLYCOSYLATED HEMOGLOBIN (HGB A1C)
Est. average glucose Bld gHb Est-mCnc: 123
Hemoglobin A1C: 5.9 % — AB (ref 4.0–5.6)

## 2022-09-28 MED ORDER — GLIPIZIDE ER 5 MG PO TB24: 5.0000 mg | ORAL_TABLET | Freq: Every day | ORAL | 1 refills | Status: DC

## 2022-09-28 NOTE — Assessment & Plan Note (Signed)
Well controlled Continue current medications Recheck metabolic panel F/u in 6 months  

## 2022-09-28 NOTE — Assessment & Plan Note (Signed)
Low A1c Complicated by HTN and HLD Continue metformin at current dose Did not tolerate jardiance due to yeast infections Was unable to afford ozempic Given low A1c, decrease glipizide to 5mg  daily UTD on vaccines,  Reminded of need for eye exam On ACEi/ARB On Statin Discussed diet and exercise F/u in 6 months

## 2022-09-28 NOTE — Progress Notes (Signed)
I,Sulibeya S Dimas,acting as a Neurosurgeon for Shirlee Latch, MD.,have documented all relevant documentation on the behalf of Shirlee Latch, MD,as directed by  Shirlee Latch, MD while in the presence of Shirlee Latch, MD.     Established patient visit   Patient: Dana Wallace   DOB: 1955/09/06   67 y.o. Female  MRN: [id number] Visit Date: 09/28/2022  Today's healthcare provider: Shirlee Latch, MD   Chief Complaint  Patient presents with   Diabetes   Subjective    HPI  Diabetes Mellitus Type II, follow-up  Lab Results  Component Value Date   HGBA1C 5.9 (A) 09/28/2022   HGBA1C 6.4 (A) 03/26/2022   HGBA1C 6.6 (A) 05/23/2021   Last seen for diabetes 6 months ago.  Management since then includes continuing the same treatment. She reports excellent compliance with treatment. She is not having side effects.   Home blood sugar records: fasting range: less than 100  Episodes of hypoglycemia? No    Current insulin regiment: none Most Recent Eye Exam: appt next month  --------------------------------------------------------------------------------------------------- Hypertension, follow-up  BP Readings from Last 3 Encounters:  09/28/22 116/77  03/26/22 115/73  05/23/21 (!) 113/55   Wt Readings from Last 3 Encounters:  09/28/22 187 lb 12.8 oz (85.2 kg)  03/26/22 195 lb 11.2 oz (88.8 kg)  05/23/21 201 lb 4.8 oz (91.3 kg)     She was last seen for hypertension 6 months ago.  BP at that visit was 115/73. Management since that visit includes no changes. She reports excellent compliance with treatment. She is not having side effects.   Outside blood pressures are not being checked. --------------------------------------------------------------------------------------------------- Lipid/Cholesterol, follow-up  Last Lipid Panel: Lab Results  Component Value Date   CHOL 142 03/26/2022   LDLCALC 67 03/26/2022   HDL 51 03/26/2022   TRIG 136 03/26/2022     She was last seen for this 6 months ago.  Management since that visit includes no changes.  She reports excellent compliance with treatment. She is not having side effects.   Last metabolic panel Lab Results  Component Value Date   GLUCOSE 90 03/26/2022   NA 140 03/26/2022   K 4.6 03/26/2022   BUN 10 03/26/2022   CREATININE 0.67 03/26/2022   EGFR 98 03/26/2022   GFRNONAA 101 02/29/2020   CALCIUM 9.9 03/26/2022   AST 26 03/26/2022   ALT 19 03/26/2022   The 10-year ASCVD risk score (Arnett DK, et al., 2019) is: 8.9%  ---------------------------------------------------------------------------------------------------  Medications: Outpatient Medications Prior to Visit  Medication Sig   ALPRAZolam (XANAX) 0.5 MG tablet TAKE 1/2 TO 1 TABLET BY MOUTH TWICE DAILY AS NEEDED   aspirin 81 MG tablet Take by mouth.   Blood Glucose Monitoring Suppl (ACCU-CHEK AVIVA PLUS) w/Device KIT To check blood glucose once daily   buPROPion (WELLBUTRIN XL) 150 MG 24 hr tablet TAKE 1 TABLET BY MOUTH ONCE DAILY   glucose blood (ACCU-CHEK AVIVA PLUS) test strip To check blood glucose once daily   hydrochlorothiazide (MICROZIDE) 12.5 MG capsule TAKE 1 CAPSULE(12.5 MG) BY MOUTH DAILY   ibuprofen (ADVIL,MOTRIN) 200 MG tablet    Lancets (ACCU-CHEK MULTICLIX) lancets To check blood glucose once daily   lisinopril (ZESTRIL) 20 MG tablet Take 1 tablet (20 mg total) by mouth daily.   metFORMIN (GLUCOPHAGE) 1000 MG tablet Take 1 tablet (1,000 mg total) by mouth 2 (two) times daily with a meal.   Multiple Vitamin tablet    Omega-3 Fatty Acids (FISH OIL) 1200 MG CPDR Take  by mouth daily.   simvastatin (ZOCOR) 10 MG tablet Take 1 tablet (10 mg total) by mouth daily at 6 PM.   [DISCONTINUED] glipiZIDE (GLUCOTROL XL) 10 MG 24 hr tablet Take 1 tablet (10 mg total) by mouth daily with breakfast.   No facility-administered medications prior to visit.    Review of Systems  Constitutional:  Negative for appetite  change and fatigue.  Eyes:  Negative for visual disturbance.  Respiratory:  Negative for cough, chest tightness and shortness of breath.   Cardiovascular:  Negative for chest pain and leg swelling.  Gastrointestinal:  Negative for abdominal pain, nausea and vomiting.  Neurological:  Negative for dizziness, light-headedness and headaches.       Objective    BP 116/77 (BP Location: Left Arm, Patient Position: Sitting, Cuff Size: Normal)   Pulse 71   Temp 98 F (36.7 C) (Temporal)   Resp 12   Ht 5' (1.524 m)   Wt 187 lb 12.8 oz (85.2 kg)   BMI 36.68 kg/m  BP Readings from Last 3 Encounters:  09/28/22 116/77  03/26/22 115/73  05/23/21 (!) 113/55   Wt Readings from Last 3 Encounters:  09/28/22 187 lb 12.8 oz (85.2 kg)  03/26/22 195 lb 11.2 oz (88.8 kg)  05/23/21 201 lb 4.8 oz (91.3 kg)      Physical Exam Vitals reviewed.  Constitutional:      General: She is not in acute distress.    Appearance: Normal appearance. She is well-developed. She is not diaphoretic.  HENT:     Head: Normocephalic and atraumatic.  Eyes:     General: No scleral icterus.    Conjunctiva/sclera: Conjunctivae normal.  Neck:     Thyroid: No thyromegaly.  Cardiovascular:     Rate and Rhythm: Normal rate and regular rhythm.     Pulses: Normal pulses.     Heart sounds: Normal heart sounds. No murmur heard. Pulmonary:     Effort: Pulmonary effort is normal. No respiratory distress.     Breath sounds: Normal breath sounds. No wheezing, rhonchi or rales.  Musculoskeletal:     Cervical back: Neck supple.     Right lower leg: No edema.     Left lower leg: No edema.  Lymphadenopathy:     Cervical: No cervical adenopathy.  Skin:    General: Skin is warm and dry.     Findings: No rash.  Neurological:     Mental Status: She is alert and oriented to person, place, and time. Mental status is at baseline.  Psychiatric:        Mood and Affect: Mood normal.        Behavior: Behavior normal.        Results for orders placed or performed in visit on 09/28/22  POCT glycosylated hemoglobin (Hb A1C)  Result Value Ref Range   Hemoglobin A1C 5.9 (A) 4.0 - 5.6 %   Est. average glucose Bld gHb Est-mCnc 123     Assessment & Plan     Problem List Items Addressed This Visit       Cardiovascular and Mediastinum   Hypertension associated with diabetes (HCC)    Well controlled Continue current medications Recheck metabolic panel F/u in 6 months       Relevant Medications   glipiZIDE (GLUCOTROL XL) 5 MG 24 hr tablet   Other Relevant Orders   Basic Metabolic Panel (BMET)     Endocrine   T2DM (type 2 diabetes mellitus) (HCC) - Primary  Low A1c Complicated by HTN and HLD Continue metformin at current dose Did not tolerate jardiance due to yeast infections Was unable to afford ozempic Given low A1c, decrease glipizide to 5mg  daily UTD on vaccines,  Reminded of need for eye exam On ACEi/ARB On Statin Discussed diet and exercise F/u in 6 months       Relevant Medications   glipiZIDE (GLUCOTROL XL) 5 MG 24 hr tablet   Other Relevant Orders   POCT glycosylated hemoglobin (Hb A1C) (Completed)   Hyperlipidemia associated with type 2 diabetes mellitus (HCC)    Previously well controlled Continue statin Repeat FLP and CMP annually Goal LDL < 70      Relevant Medications   glipiZIDE (GLUCOTROL XL) 5 MG 24 hr tablet     Other   Morbid obesity (HCC)    BMI 38 and assoc with HTN, T2DM, HLD Continue wellbutrin for cravings Discussed importance of healthy weight management Discussed diet and exercise       Relevant Medications   glipiZIDE (GLUCOTROL XL) 5 MG 24 hr tablet   Other Visit Diagnoses     Hypercholesterolemia with hypertriglyceridemia       Need for shingles vaccine       Relevant Orders   Varicella-zoster vaccine IM (Completed)        Return in about 6 months (around 03/31/2023) for chronic disease f/u.      I, Shirlee Latch, MD, have  reviewed all documentation for this visit. The documentation on 09/28/22 for the exam, diagnosis, procedures, and orders are all accurate and complete.   Kourtney Terriquez, Marzella Schlein, MD, MPH [facility]

## 2022-09-28 NOTE — Assessment & Plan Note (Signed)
BMI 38 and assoc with HTN, T2DM, HLD Continue wellbutrin for cravings Discussed importance of healthy weight management Discussed diet and exercise

## 2022-09-28 NOTE — Assessment & Plan Note (Signed)
Previously well controlled ?Continue statin ?Repeat FLP and CMP annually ?Goal LDL < 70  ?

## 2022-09-29 LAB — BASIC METABOLIC PANEL
BUN/Creatinine Ratio: 17 (ref 12–28)
BUN: 14 mg/dL (ref 8–27)
CO2: 24 mmol/L (ref 20–29)
Calcium: 9.8 mg/dL (ref 8.7–10.3)
Chloride: 99 mmol/L (ref 96–106)
Creatinine, Ser: 0.81 mg/dL (ref 0.57–1.00)
Glucose: 84 mg/dL (ref 70–99)
Potassium: 4.6 mmol/L (ref 3.5–5.2)
Sodium: 141 mmol/L (ref 134–144)
eGFR: 81 mL/min/{1.73_m2} (ref >59)

## 2022-10-11 ENCOUNTER — Other Ambulatory Visit: Payer: Self-pay | Admitting: Family Medicine

## 2022-12-19 ENCOUNTER — Encounter: Payer: Self-pay | Admitting: Family Medicine

## 2022-12-21 ENCOUNTER — Encounter: Payer: Self-pay | Admitting: Family Medicine

## 2022-12-21 ENCOUNTER — Other Ambulatory Visit: Payer: Self-pay

## 2022-12-21 DIAGNOSIS — E119 Type 2 diabetes mellitus without complications: Secondary | ICD-10-CM

## 2022-12-21 DIAGNOSIS — F32A Depression, unspecified: Secondary | ICD-10-CM

## 2022-12-21 DIAGNOSIS — F419 Anxiety disorder, unspecified: Secondary | ICD-10-CM

## 2022-12-21 DIAGNOSIS — E782 Mixed hyperlipidemia: Secondary | ICD-10-CM

## 2022-12-21 DIAGNOSIS — I152 Hypertension secondary to endocrine disorders: Secondary | ICD-10-CM

## 2022-12-21 DIAGNOSIS — E1165 Type 2 diabetes mellitus with hyperglycemia: Secondary | ICD-10-CM

## 2022-12-24 MED ORDER — BUPROPION HCL ER (XL) 150 MG PO TB24: 150.0000 mg | ORAL_TABLET | Freq: Every day | ORAL | 1 refills | Status: DC

## 2022-12-24 MED ORDER — METFORMIN HCL 1000 MG PO TABS
1000.0000 mg | ORAL_TABLET | Freq: Two times a day (BID) | ORAL | 3 refills | Status: DC
Start: 2022-12-24 — End: 2024-01-28

## 2022-12-24 MED ORDER — SIMVASTATIN 10 MG PO TABS
10.0000 mg | ORAL_TABLET | Freq: Every day | ORAL | 3 refills | Status: DC
Start: 2022-12-24 — End: 2023-09-19

## 2022-12-24 MED ORDER — ACCU-CHEK AVIVA PLUS VI STRP
ORAL_STRIP | 12 refills | Status: DC
Start: 2022-12-24 — End: 2023-02-11

## 2022-12-24 MED ORDER — LISINOPRIL 20 MG PO TABS
20.0000 mg | ORAL_TABLET | Freq: Every day | ORAL | 3 refills | Status: DC
Start: 2022-12-24 — End: 2023-09-23

## 2022-12-24 MED ORDER — GLIPIZIDE ER 5 MG PO TB24
5.0000 mg | ORAL_TABLET | Freq: Every day | ORAL | 1 refills | Status: DC
Start: 2022-12-24 — End: 2023-06-20

## 2023-01-28 DIAGNOSIS — K080 Exfoliation of teeth due to systemic causes: Secondary | ICD-10-CM | POA: Diagnosis not present

## 2023-02-08 ENCOUNTER — Other Ambulatory Visit: Payer: Self-pay

## 2023-02-08 ENCOUNTER — Encounter: Payer: Self-pay | Admitting: Family Medicine

## 2023-02-11 ENCOUNTER — Other Ambulatory Visit: Payer: Self-pay

## 2023-02-11 MED ORDER — ONETOUCH ULTRA VI STRP: 1.0000 | ORAL_STRIP | Freq: Every day | 12 refills | Status: AC

## 2023-02-11 MED ORDER — ONETOUCH ULTRA 2 W/DEVICE KIT: 1.0000 | PACK | Freq: Every day | 0 refills | Status: AC

## 2023-03-01 ENCOUNTER — Encounter: Payer: Self-pay | Admitting: Family Medicine

## 2023-03-01 DIAGNOSIS — E1159 Type 2 diabetes mellitus with other circulatory complications: Secondary | ICD-10-CM

## 2023-03-04 MED ORDER — HYDROCHLOROTHIAZIDE 12.5 MG PO CAPS
ORAL_CAPSULE | ORAL | 0 refills | Status: DC
Start: 2023-03-04 — End: 2023-06-03

## 2023-03-14 DIAGNOSIS — K080 Exfoliation of teeth due to systemic causes: Secondary | ICD-10-CM | POA: Diagnosis not present

## 2023-04-01 ENCOUNTER — Ambulatory Visit: Payer: 59 | Admitting: Family Medicine

## 2023-04-04 ENCOUNTER — Encounter: Payer: Self-pay | Admitting: Family Medicine

## 2023-04-04 ENCOUNTER — Ambulatory Visit (INDEPENDENT_AMBULATORY_CARE_PROVIDER_SITE_OTHER): Payer: Medicare Other | Admitting: Family Medicine

## 2023-04-04 VITALS — BP 132/75 | HR 76 | Ht 60.0 in | Wt 189.9 lb

## 2023-04-04 DIAGNOSIS — E1169 Type 2 diabetes mellitus with other specified complication: Secondary | ICD-10-CM | POA: Diagnosis not present

## 2023-04-04 DIAGNOSIS — Z23 Encounter for immunization: Secondary | ICD-10-CM | POA: Diagnosis not present

## 2023-04-04 DIAGNOSIS — E1165 Type 2 diabetes mellitus with hyperglycemia: Secondary | ICD-10-CM | POA: Diagnosis not present

## 2023-04-04 DIAGNOSIS — E1159 Type 2 diabetes mellitus with other circulatory complications: Secondary | ICD-10-CM | POA: Diagnosis not present

## 2023-04-04 DIAGNOSIS — I152 Hypertension secondary to endocrine disorders: Secondary | ICD-10-CM

## 2023-04-04 DIAGNOSIS — E785 Hyperlipidemia, unspecified: Secondary | ICD-10-CM

## 2023-04-04 DIAGNOSIS — Z7984 Long term (current) use of oral hypoglycemic drugs: Secondary | ICD-10-CM

## 2023-04-04 LAB — POCT GLYCOSYLATED HEMOGLOBIN (HGB A1C): Hemoglobin A1C: 5.9 % — AB (ref 4.0–5.6)

## 2023-04-04 MED ORDER — BUPROPION HCL ER (XL) 300 MG PO TB24: 300.0000 mg | ORAL_TABLET | Freq: Every day | ORAL | 1 refills | Status: DC

## 2023-04-04 MED ORDER — NALTREXONE HCL 50 MG PO TABS: 25.0000 mg | ORAL_TABLET | Freq: Every day | ORAL | 1 refills | Status: DC

## 2023-04-04 NOTE — Assessment & Plan Note (Signed)
Difficulty in weight management. Reports persistent 'food noise' and carbohydrate cravings. Current weight is below 190 lbs, previously over 200 lbs. Discussed potential benefits of adding naltrexone to current bupropion regimen. Explained that naltrexone, originally used for alcohol dependence, can help reduce food cravings when combined with bupropion. No significant interactions expected with current medications. - Increase bupropion XL to 300 mg daily - Add naltrexone 25 mg once daily - Monitor weight and food cravings

## 2023-04-04 NOTE — Assessment & Plan Note (Signed)
Currently managed with simvastatin 10 mg daily. No recent changes in lipid profile discussed. - Continue simvastatin 10 mg daily - Recheck lipid profile with routine labs

## 2023-04-04 NOTE — Assessment & Plan Note (Signed)
Well-controlled with an A1c of 5.9. Expresses anxiety about maintaining current A1c levels. Current medications include metformin 1000 mg twice daily and glipizide 5 mg daily. Blood sugar was 85 this morning. Discussed the importance of maintaining current regimen to prevent complications such as diabetic foot wounds. - Continue metformin 1000 mg twice daily - Continue glipizide 5 mg daily - Recheck A1c in 6 months - Order routine labs including cholesterol, kidney, and liver function tests

## 2023-04-04 NOTE — Assessment & Plan Note (Signed)
Well-controlled with current medications. Blood pressure is stable. Current medications include hydrochlorothiazide 12.5 mg daily and lisinopril 20 mg daily. - Continue hydrochlorothiazide 12.5 mg daily - Continue lisinopril 20 mg daily

## 2023-04-04 NOTE — Progress Notes (Signed)
Established Patient Office Visit  Subjective   Patient ID: Dana Wallace, female    DOB: [birth year]  Age: 67 y.o. MRN: [id number]  Chief Complaint  Patient presents with   Medical Management of Chronic Issues    6 month follow-up checking sugar at home typically averaging between 80-90's and every blue moon 110's. Patient reports not symptoms and an eye exam next month   Immunizations    Would like to receive pneumonia vaccine    HPI  Discussed the use of AI scribe software for clinical note transcription with the patient, who gave verbal consent to proceed.  History of Present Illness   The patient, a 67 year old with a history of diabetes, high cholesterol, and high blood pressure, presents with concerns about weight loss and food cravings. The patient reports waking up thinking about what they can eat and struggling with cravings for carbohydrates. Despite not eating a lot, the patient reports difficulty with the scale not moving. The patient has been on Wellbutrin, which initially helped with the cravings, but the effect seems to have faded. The patient's last A1c was 5.9, and the patient expresses anxiety about maintaining this level. The patient is also due for a pneumonia shot and a foot exam.         ROS    Objective:     BP 132/75   Pulse 76   Ht 5' (1.524 m)   Wt 189 lb 14.4 oz (86.1 kg)   SpO2 99%   BMI 37.09 kg/m    Physical Exam Vitals reviewed.  Constitutional:      General: She is not in acute distress.    Appearance: Normal appearance. She is well-developed. She is not diaphoretic.  HENT:     Head: Normocephalic and atraumatic.  Eyes:     General: No scleral icterus.    Conjunctiva/sclera: Conjunctivae normal.  Neck:     Thyroid: No thyromegaly.  Cardiovascular:     Rate and Rhythm: Normal rate and regular rhythm.     Heart sounds: Normal heart sounds. No murmur heard. Pulmonary:     Effort: Pulmonary effort is normal. No respiratory  distress.     Breath sounds: Normal breath sounds. No wheezing, rhonchi or rales.  Musculoskeletal:     Cervical back: Neck supple.     Right lower leg: No edema.     Left lower leg: No edema.  Lymphadenopathy:     Cervical: No cervical adenopathy.  Skin:    General: Skin is warm and dry.     Findings: No rash.  Neurological:     Mental Status: She is alert and oriented to person, place, and time. Mental status is at baseline.  Psychiatric:        Mood and Affect: Mood normal.        Behavior: Behavior normal.      Results for orders placed or performed in visit on 04/04/23  POCT HgB A1C  Result Value Ref Range   Hemoglobin A1C 5.9 (A) 4.0 - 5.6 %   HbA1c POC (<> result, manual entry)     HbA1c, POC (prediabetic range)     HbA1c, POC (controlled diabetic range)        The 10-year ASCVD risk score (Arnett DK, et al., 2019) is: 12.8%    Assessment & Plan:   Problem List Items Addressed This Visit       Cardiovascular and Mediastinum   Hypertension associated with diabetes (HCC)    Well-controlled  with current medications. Blood pressure is stable. Current medications include hydrochlorothiazide 12.5 mg daily and lisinopril 20 mg daily. - Continue hydrochlorothiazide 12.5 mg daily - Continue lisinopril 20 mg daily      Relevant Orders   Comprehensive metabolic panel     Endocrine   T2DM (type 2 diabetes mellitus) (HCC) - Primary    Well-controlled with an A1c of 5.9. Expresses anxiety about maintaining current A1c levels. Current medications include metformin 1000 mg twice daily and glipizide 5 mg daily. Blood sugar was 85 this morning. Discussed the importance of maintaining current regimen to prevent complications such as diabetic foot wounds. - Continue metformin 1000 mg twice daily - Continue glipizide 5 mg daily - Recheck A1c in 6 months - Order routine labs including cholesterol, kidney, and liver function tests      Relevant Orders   Urine  microalbumin-creatinine with uACR   POCT HgB A1C (Completed)   Hyperlipidemia associated with type 2 diabetes mellitus (HCC)    Currently managed with simvastatin 10 mg daily. No recent changes in lipid profile discussed. - Continue simvastatin 10 mg daily - Recheck lipid profile with routine labs      Relevant Orders   Lipid panel   Comprehensive metabolic panel     Other   Morbid obesity (HCC)    Difficulty in weight management. Reports persistent 'food noise' and carbohydrate cravings. Current weight is below 190 lbs, previously over 200 lbs. Discussed potential benefits of adding naltrexone to current bupropion regimen. Explained that naltrexone, originally used for alcohol dependence, can help reduce food cravings when combined with bupropion. No significant interactions expected with current medications. - Increase bupropion XL to 300 mg daily - Add naltrexone 25 mg once daily - Monitor weight and food cravings      Other Visit Diagnoses     Immunization due       Relevant Orders   Pneumococcal conjugate vaccine 20-valent (Completed)   Need for pneumococcal vaccine       Relevant Orders   Pneumococcal conjugate vaccine 20-valent (Completed)          General Health Maintenance Due for a 'Welcome to [facility]' visit. Discussed the importance of routine screenings and vaccinations. Received a pneumonia shot today. Annual urine test and foot exam performed. Routine eye exam scheduled with Dr. Clydene Pugh on May 04, 2023. Provided contact card for eye doctor to fax results. - Schedule 'Welcome to Medicare' visit in 6 months - Administer pneumonia shot today - Perform annual urine test and foot exam - Ensure routine eye exam with Dr. Clydene Pugh on May 04, 2023 - Provide contact card for eye doctor to fax results  Follow-up - Follow-up in 6 months for 'Welcome to [facility]' visit - Monitor lab results and communicate via MyChart - Ensure 90-day medication refills are  managed through [employer].        Return in about 6 months (around 10/02/2023) for welcome to medicare.    Shirlee Latch, MD

## 2023-04-05 LAB — MICROALBUMIN / CREATININE URINE RATIO
Creatinine, Urine: 136.8 mg/dL
Microalb/Creat Ratio: 5 mg/g{creat} (ref 0–29)
Microalbumin, Urine: 7.3 ug/mL

## 2023-04-15 DIAGNOSIS — E1159 Type 2 diabetes mellitus with other circulatory complications: Secondary | ICD-10-CM | POA: Diagnosis not present

## 2023-04-15 DIAGNOSIS — E785 Hyperlipidemia, unspecified: Secondary | ICD-10-CM | POA: Diagnosis not present

## 2023-04-15 DIAGNOSIS — E1169 Type 2 diabetes mellitus with other specified complication: Secondary | ICD-10-CM | POA: Diagnosis not present

## 2023-04-15 DIAGNOSIS — I152 Hypertension secondary to endocrine disorders: Secondary | ICD-10-CM | POA: Diagnosis not present

## 2023-04-16 LAB — COMPREHENSIVE METABOLIC PANEL
ALT: 16 [IU]/L (ref 0–32)
AST: 16 [IU]/L (ref 0–40)
Albumin: 3.9 g/dL (ref 3.9–4.9)
Alkaline Phosphatase: 59 [IU]/L (ref 44–121)
BUN/Creatinine Ratio: 19 (ref 12–28)
BUN: 13 mg/dL (ref 8–27)
Bilirubin Total: 0.5 mg/dL (ref 0.0–1.2)
CO2: 24 mmol/L (ref 20–29)
Calcium: 9.5 mg/dL (ref 8.7–10.3)
Chloride: 98 mmol/L (ref 96–106)
Creatinine, Ser: 0.69 mg/dL (ref 0.57–1.00)
Globulin, Total: 3.7 g/dL (ref 1.5–4.5)
Glucose: 76 mg/dL (ref 70–99)
Potassium: 4.2 mmol/L (ref 3.5–5.2)
Sodium: 138 mmol/L (ref 134–144)
Total Protein: 7.6 g/dL (ref 6.0–8.5)
eGFR: 96 mL/min/{1.73_m2} (ref >59)

## 2023-04-16 LAB — LIPID PANEL
Chol/HDL Ratio: 2.3 {ratio} (ref 0.0–4.4)
Cholesterol, Total: 111 mg/dL (ref 100–199)
HDL: 49 mg/dL (ref >39)
LDL Chol Calc (NIH): 45 mg/dL (ref 0–99)
Triglycerides: 86 mg/dL (ref 0–149)
VLDL Cholesterol Cal: 17 mg/dL (ref 5–40)

## 2023-05-08 DIAGNOSIS — H40023 Open angle with borderline findings, high risk, bilateral: Secondary | ICD-10-CM | POA: Diagnosis not present

## 2023-05-08 DIAGNOSIS — E113293 Type 2 diabetes mellitus with mild nonproliferative diabetic retinopathy without macular edema, bilateral: Secondary | ICD-10-CM | POA: Diagnosis not present

## 2023-05-08 DIAGNOSIS — H2513 Age-related nuclear cataract, bilateral: Secondary | ICD-10-CM | POA: Diagnosis not present

## 2023-05-08 LAB — HM DIABETES EYE EXAM

## 2023-06-01 ENCOUNTER — Other Ambulatory Visit: Payer: Self-pay | Admitting: Family Medicine

## 2023-06-01 DIAGNOSIS — E1159 Type 2 diabetes mellitus with other circulatory complications: Secondary | ICD-10-CM

## 2023-06-03 ENCOUNTER — Telehealth: Payer: Self-pay | Admitting: Family Medicine

## 2023-06-03 DIAGNOSIS — E1159 Type 2 diabetes mellitus with other circulatory complications: Secondary | ICD-10-CM

## 2023-06-03 MED ORDER — HYDROCHLOROTHIAZIDE 12.5 MG PO CAPS: ORAL_CAPSULE | ORAL | 0 refills | Status: DC

## 2023-06-03 NOTE — Telephone Encounter (Signed)
 [facility] pharmacy is requesting prescription refill hydrochlorothiazide (MICROZIDE) 12.5 MG capsule  Please advise

## 2023-06-13 DIAGNOSIS — H2513 Age-related nuclear cataract, bilateral: Secondary | ICD-10-CM | POA: Diagnosis not present

## 2023-06-13 DIAGNOSIS — E113293 Type 2 diabetes mellitus with mild nonproliferative diabetic retinopathy without macular edema, bilateral: Secondary | ICD-10-CM | POA: Diagnosis not present

## 2023-06-13 DIAGNOSIS — H40023 Open angle with borderline findings, high risk, bilateral: Secondary | ICD-10-CM | POA: Diagnosis not present

## 2023-06-18 ENCOUNTER — Other Ambulatory Visit: Payer: Self-pay | Admitting: Family Medicine

## 2023-06-18 DIAGNOSIS — E1165 Type 2 diabetes mellitus with hyperglycemia: Secondary | ICD-10-CM

## 2023-06-20 NOTE — Telephone Encounter (Signed)
Requested Prescriptions  Pending Prescriptions Disp Refills   glipiZIDE (GLUCOTROL XL) 5 MG 24 hr tablet [Pharmacy Med Name: GLIPIZIDE ER 5MG  TABLETS] 90 tablet 1    Sig: TAKE 1 TABLET(5 MG) BY MOUTH DAILY WITH BREAKFAST     Endocrinology:  Diabetes - Sulfonylureas Passed - 06/20/2023  1:01 PM      Passed - HBA1C is between 0 and 7.9 and within 180 days    Hemoglobin A1C  Date Value Ref Range Status  04/04/2023 5.9 (A) 4.0 - 5.6 % Final  06/25/2014 5.9  Final   Hgb A1c MFr Bld  Date Value Ref Range Status  01/16/2021 9.7 (H) 4.8 - 5.6 % Final    Comment:             Prediabetes: 5.7 - 6.4          Diabetes: >6.4          Glycemic control for adults with diabetes: <7.0          Passed - Cr in normal range and within 360 days    Creatinine, Ser  Date Value Ref Range Status  04/15/2023 0.69 0.57 - 1.00 mg/dL Final         Passed - Valid encounter within last 6 months    Recent Outpatient Visits           2 months ago Type 2 diabetes mellitus with other specified complication, without long-term current use of insulin (HCC)   [place] [facility] [place], Marzella Schlein, MD   8 months ago Type 2 diabetes mellitus with hyperglycemia, without long-term current use of insulin [facility])   [place] [facility] [place], Marzella Schlein, MD   1 year ago Type 2 diabetes mellitus with hyperglycemia, without long-term current use of insulin [facility])   [place] [facility] [place], Marzella Schlein, MD   2 years ago Type 2 diabetes mellitus with hyperglycemia, without long-term current use of insulin [facility])   [place] [facility] Merita Norton T, FNP   2 years ago Type 2 diabetes mellitus with other specified complication, without long-term current use of insulin [facility])   [place] [facility] [place], Marzella Schlein, MD

## 2023-07-25 DIAGNOSIS — H40023 Open angle with borderline findings, high risk, bilateral: Secondary | ICD-10-CM | POA: Diagnosis not present

## 2023-08-30 ENCOUNTER — Other Ambulatory Visit: Payer: Self-pay | Admitting: Family Medicine

## 2023-08-30 ENCOUNTER — Ambulatory Visit
Admission: RE | Admit: 2023-08-30 | Discharge: 2023-08-30 | Disposition: A | Discharge status: Home / Self Care | Source: Ambulatory Visit | Attending: Physician Assistant | Admitting: Physician Assistant

## 2023-08-30 ENCOUNTER — Ambulatory Visit (INDEPENDENT_AMBULATORY_CARE_PROVIDER_SITE_OTHER): Payer: Self-pay | Admitting: Physician Assistant

## 2023-08-30 ENCOUNTER — Ambulatory Visit
Admission: RE | Admit: 2023-08-30 | Discharge: 2023-08-30 | Disposition: A | Discharge status: Home / Self Care | Attending: Physician Assistant | Admitting: Physician Assistant

## 2023-08-30 ENCOUNTER — Encounter: Payer: Self-pay | Admitting: Physician Assistant

## 2023-08-30 VITALS — BP 117/61 | HR 93 | Resp 16 | Ht 60.0 in | Wt 189.3 lb

## 2023-08-30 DIAGNOSIS — R0789 Other chest pain: Secondary | ICD-10-CM

## 2023-08-30 DIAGNOSIS — Z7984 Long term (current) use of oral hypoglycemic drugs: Secondary | ICD-10-CM

## 2023-08-30 DIAGNOSIS — J302 Other seasonal allergic rhinitis: Secondary | ICD-10-CM

## 2023-08-30 DIAGNOSIS — I152 Hypertension secondary to endocrine disorders: Secondary | ICD-10-CM

## 2023-08-30 DIAGNOSIS — R059 Cough, unspecified: Secondary | ICD-10-CM | POA: Diagnosis not present

## 2023-08-30 DIAGNOSIS — E1159 Type 2 diabetes mellitus with other circulatory complications: Secondary | ICD-10-CM | POA: Diagnosis not present

## 2023-08-30 DIAGNOSIS — R079 Chest pain, unspecified: Secondary | ICD-10-CM | POA: Diagnosis not present

## 2023-08-30 DIAGNOSIS — E1169 Type 2 diabetes mellitus with other specified complication: Secondary | ICD-10-CM | POA: Diagnosis not present

## 2023-08-30 DIAGNOSIS — R0989 Other specified symptoms and signs involving the circulatory and respiratory systems: Secondary | ICD-10-CM | POA: Diagnosis not present

## 2023-08-30 MED ORDER — ALBUTEROL SULFATE HFA 108 (90 BASE) MCG/ACT IN AERS: 2.0000 | INHALATION_SPRAY | Freq: Four times a day (QID) | RESPIRATORY_TRACT | 2 refills | Status: AC | PRN

## 2023-08-30 NOTE — Telephone Encounter (Signed)
 Requested medication (s) are due for refill today - yes  Requested medication (s) are on the active medication list -yes  Future visit scheduled -today  Last refill: 06/03/23 #90  Notes to clinic: Patient is at visit now- will send to office for refill authorization based on visit outcome/plan  Requested Prescriptions  Pending Prescriptions Disp Refills   hydrochlorothiazide (MICROZIDE) 12.5 MG capsule [Pharmacy Med Name: HYDROCHLOROTHIAZIDE 12.5MG  CAPSULES] 90 capsule 0    Sig: TAKE 1 CAPSULE(12.5 MG) BY MOUTH DAILY     Cardiovascular: Diuretics - Thiazide Failed - 08/30/2023  2:00 PM      Failed - Valid encounter within last 6 months    Recent Outpatient Visits           Today    [facility] [place], [place], [place]              Passed - Cr in normal range and within 180 days    Creatinine, Ser  Date Value Ref Range Status  04/15/2023 0.69 0.57 - 1.00 mg/dL Final         Passed - K in normal range and within 180 days    Potassium  Date Value Ref Range Status  04/15/2023 4.2 3.5 - 5.2 mmol/L Final         Passed - Na in normal range and within 180 days    Sodium  Date Value Ref Range Status  04/15/2023 138 134 - 144 mmol/L Final         Passed - Last BP in normal range    BP Readings from Last 1 Encounters:  08/30/23 117/61            Requested Prescriptions  Pending Prescriptions Disp Refills   hydrochlorothiazide (MICROZIDE) 12.5 MG capsule [Pharmacy Med Name: HYDROCHLOROTHIAZIDE 12.5MG  CAPSULES] 90 capsule 0    Sig: TAKE 1 CAPSULE(12.5 MG) BY MOUTH DAILY     Cardiovascular: Diuretics - Thiazide Failed - 08/30/2023  2:00 PM      Failed - Valid encounter within last 6 months    Recent Outpatient Visits           Today    [facility] [place], [place], [place]              Passed - Cr in normal range and within 180 days    Creatinine, Ser  Date Value Ref Range Status  04/15/2023 0.69 0.57 - 1.00  mg/dL Final         Passed - K in normal range and within 180 days    Potassium  Date Value Ref Range Status  04/15/2023 4.2 3.5 - 5.2 mmol/L Final         Passed - Na in normal range and within 180 days    Sodium  Date Value Ref Range Status  04/15/2023 138 134 - 144 mmol/L Final         Passed - Last BP in normal range    BP Readings from Last 1 Encounters:  08/30/23 117/61

## 2023-08-30 NOTE — Progress Notes (Signed)
 Has appt with Dr B for AWV.Marland Kitchen Postpone will talk to Dr B Cevrvical cancer Screening.

## 2023-08-30 NOTE — Progress Notes (Signed)
 Established patient visit  Patient: Dana Wallace   DOB: 01-08-1956   68 y.o. Female  MRN: [id number] Visit Date: 08/30/2023  Today's healthcare provider: Blane Bunting, PA-C   Chief Complaint  Patient presents with   URI    Respiratory concerna 3 wks of deep breathing causing her some discomfort with no cough   Subjective      Discussed the use of AI scribe software for clinical note transcription with the patient, who gave verbal consent to proceed.  History of Present Illness The patient, a retired [employer] fund[employer] with a history of hypertension and diabetes, presents with chest discomfort and difficulty taking deep breaths. The symptoms started about three to four weeks ago following a hard sneeze. The patient describes the discomfort as being located in the center of the chest and in the flank area, with more discomfort on the right side. The discomfort is felt with every breath, particularly during deep inspiration. The patient also reports an intermittent dry cough and a tickle in the throat. The patient has been managing the discomfort with ibuprofen, which provides temporary relief. The patient denies any shortness of breath, rapid heart beating, or chest pain. The patient also denies any changes in diet or swelling.       08/30/2023    2:56 PM 08/30/2023    2:55 PM 09/28/2022    8:19 AM  Depression screen PHQ 2/9  Decreased Interest 0 0 0  Down, Depressed, Hopeless 0 0 0  PHQ - 2 Score 0 0 0  Altered sleeping 2 2 0  Tired, decreased energy 0 0 0  Change in appetite 0 0 0  Feeling bad or failure about yourself  0 0 0  Trouble concentrating 0 0 0  Moving slowly or fidgety/restless 0 0 0  Suicidal thoughts 0 0 0  PHQ-9 Score 2 2 0  Difficult doing work/chores Not difficult at all Not difficult at all Not difficult at all      08/30/2023    2:56 PM  GAD 7 : Generalized Anxiety Score  Nervous, Anxious, on Edge 1  Control/stop worrying 1  Worry too much - different things 1   Trouble relaxing 1  Restless 0  Easily annoyed or irritable 0  Afraid - awful might happen 1  Total GAD 7 Score 5  Anxiety Difficulty Not difficult at all    Medications: Outpatient Medications Prior to Visit  Medication Sig   ALPRAZolam (XANAX) 0.5 MG tablet TAKE 1/2 TO 1 TABLET BY MOUTH TWICE DAILY AS NEEDED   aspirin 81 MG tablet Take by mouth.   Blood Glucose Monitoring Suppl (ONE TOUCH ULTRA 2) w/Device KIT 1 each by Does not apply route daily at 2 PM.   glipiZIDE (GLUCOTROL XL) 5 MG 24 hr tablet TAKE 1 TABLET(5 MG) BY MOUTH DAILY WITH BREAKFAST   glucose blood (ONETOUCH ULTRA) test strip 1 each by Other route daily. Use as instructed   ibuprofen (ADVIL,MOTRIN) 200 MG tablet    lisinopril (ZESTRIL) 20 MG tablet Take 1 tablet (20 mg total) by mouth daily.   metFORMIN (GLUCOPHAGE) 1000 MG tablet Take 1 tablet (1,000 mg total) by mouth 2 (two) times daily with a meal.   Multiple Vitamin tablet    Omega-3 Fatty Acids (FISH OIL) 1200 MG CPDR Take by mouth daily.   simvastatin (ZOCOR) 10 MG tablet Take 1 tablet (10 mg total) by mouth daily at 6 PM.   [DISCONTINUED] hydrochlorothiazide (MICROZIDE) 12.5 MG capsule TAKE 1 CAPSULE(12.5 MG)  BY MOUTH DAILY   buPROPion (WELLBUTRIN XL) 300 MG 24 hr tablet Take 1 tablet (300 mg total) by mouth daily.   naltrexone (DEPADE) 50 MG tablet Take 0.5 tablets (25 mg total) by mouth daily.   No facility-administered medications prior to visit.    Review of Systems All negative Except see HPI       Objective    BP 117/61 (BP Location: Left Arm, Patient Position: Sitting)   Pulse 93   Resp 16   Ht 5' (1.524 m)   Wt 189 lb 4.8 oz (85.9 kg)   SpO2 100%   BMI 36.97 kg/m     Physical Exam Vitals reviewed.  Constitutional:      General: She is not in acute distress.    Appearance: Normal appearance. She is well-developed. She is not diaphoretic.  HENT:     Head: Normocephalic and atraumatic.  Eyes:     General: No scleral icterus.     Conjunctiva/sclera: Conjunctivae normal.  Neck:     Thyroid: No thyromegaly.  Cardiovascular:     Rate and Rhythm: Normal rate and regular rhythm.     Pulses: Normal pulses.     Heart sounds: Normal heart sounds. No murmur heard. Pulmonary:     Effort: Pulmonary effort is normal. No respiratory distress.     Breath sounds: Normal breath sounds. No wheezing, rhonchi or rales.  Musculoskeletal:     Cervical back: Neck supple.     Right lower leg: No edema.     Left lower leg: No edema.  Lymphadenopathy:     Cervical: No cervical adenopathy.  Skin:    General: Skin is warm and dry.     Findings: No rash.  Neurological:     Mental Status: She is alert and oriented to person, place, and time. Mental status is at baseline.  Psychiatric:        Mood and Affect: Mood normal.        Behavior: Behavior normal.      Results for orders placed or performed in visit on 08/30/23  CBC with Differential/Platelet  Result Value Ref Range   WBC 7.1 3.4 - 10.8 x10E3/uL   RBC 3.73 (L) 3.77 - 5.28 x10E6/uL   Hemoglobin 12.1 11.1 - 15.9 g/dL   Hematocrit 16.1 09.6 - 46.6 %   MCV 98 (H) 79 - 97 fL   MCH 32.4 26.6 - 33.0 pg   MCHC 33.2 31.5 - 35.7 g/dL   RDW 04.5 40.9 - 81.1 %   Platelets 189 150 - 450 x10E3/uL   Neutrophils 70 Not Estab. %   Lymphs 21 Not Estab. %   Monocytes 7 Not Estab. %   Eos 1 Not Estab. %   Basos 0 Not Estab. %   Neutrophils Absolute 5.0 1.4 - 7.0 x10E3/uL   Lymphocytes Absolute 1.5 0.7 - 3.1 x10E3/uL   Monocytes Absolute 0.5 0.1 - 0.9 x10E3/uL   EOS (ABSOLUTE) 0.1 0.0 - 0.4 x10E3/uL   Basophils Absolute 0.0 0.0 - 0.2 x10E3/uL   Immature Granulocytes 1 Not Estab. %   Immature Grans (Abs) 0.0 0.0 - 0.1 x10E3/uL  Comprehensive metabolic panel with GFR  Result Value Ref Range   Glucose 92 70 - 99 mg/dL   BUN 17 8 - 27 mg/dL   Creatinine, Ser 9.14 0.57 - 1.00 mg/dL   eGFR 97 >78 GN/FAO/1.30   BUN/Creatinine Ratio 25 12 - 28   Sodium 142 134 - 144 mmol/L    Potassium  4.2 3.5 - 5.2 mmol/L   Chloride 99 96 - 106 mmol/L   CO2 23 20 - 29 mmol/L   Calcium 9.9 8.7 - 10.3 mg/dL   Total Protein 8.1 6.0 - 8.5 g/dL   Albumin 4.2 3.9 - 4.9 g/dL   Globulin, Total 3.9 1.5 - 4.5 g/dL   Bilirubin Total 0.5 0.0 - 1.2 mg/dL   Alkaline Phosphatase 60 44 - 121 IU/L   AST 21 0 - 40 IU/L   ALT 13 0 - 32 IU/L  TSH  Result Value Ref Range   TSH 1.790 0.450 - 4.500 uIU/mL  Hemoglobin A1c  Result Value Ref Range   Hgb A1c MFr Bld 5.9 (H) 4.8 - 5.6 %   Est. average glucose Bld gHb Est-mCnc 123 mg/dL  Lipid panel  Result Value Ref Range   Cholesterol, Total 129 100 - 199 mg/dL   Triglycerides 086 0 - 149 mg/dL   HDL 44 >57 mg/dL   VLDL Cholesterol Cal 20 5 - 40 mg/dL   LDL Chol Calc (NIH) 65 0 - 99 mg/dL   Chol/HDL Ratio 2.9 0.0 - 4.4 ratio  Troponin T  Result Value Ref Range   Troponin T (Highly Sensitive) 11 0 - 14 ng/L  D-Dimer, Quantitative  Result Value Ref Range   D-DIMER 0.24 0.00 - 0.49 mg/L FEU      Assessment & Plan Chest Pain Intermittent chest pain likely due to pleurisy or musculoskeletal pain. Pericarditis less likely. NSAIDs and nebulizer provided some relief. - Order EKG to rule out cardiac causes. - Order chest X-ray to evaluate pulmonary status and rule out pleurisy. - Order lab work including troponin and D-dimer to rule out embolism or blood clot. - Prescribe albuterol inhaler for trial use to assess symptom relief. - Continue NSAIDs for pain management, caution against use on an empty stomach.  Seasonal Allergies Possible seasonal allergies with nasal congestion and dry cough. High pollen levels noted. Reactive airway symptoms suggest trial of albuterol inhaler. - Consider trial of allergy medications if symptoms persist. - Prescribe albuterol inhaler for trial use.  Diabetes Mellitus Chronic Continue glipizide 5mg  and metformin 1000 mg bid Diabetes managed with regular follow-ups. Scheduled to see Doctor B in May. Will  follow-up  Hypertension Chronic and stable associated with DMIi Continue hydrochlorothiazide 12.5, lisinopril 20 Continue lifestyle modifications Continue simvastatin 10 for cholesterol control. Will follow-up  Follow-up Follow-up plans discussed for symptom resolution and chronic condition management. - Follow up with Doctor B on May 15 for diabetes management. - Review lab and imaging results once available and contact her with findings. - Schedule additional follow-up with current provider if necessary based on test results.  Chest discomfort (Primary)  - CBC with Differential/Platelet - Comprehensive metabolic panel with GFR - TSH - Hemoglobin A1c - Lipid panel - DG Chest 2 View; Future - Troponin T - D-Dimer, Quantitative - albuterol (VENTOLIN HFA) 108 (90 Base) MCG/ACT inhaler; Inhale 2 puffs into the lungs every 6 (six) hours as needed for wheezing or shortness of breath.  Dispense: 8 g; Refill: 2   Orders Placed This Encounter  Procedures   DG Chest 2 View    Standing Status:   Future    Number of Occurrences:   1    Expected Date:   08/30/2023    Expiration Date:   09/29/2023    Reason for Exam (SYMPTOM  OR DIAGNOSIS REQUIRED):   chest discomfort, interm cough    Preferred imaging location?:  [facility]   CBC with Differential/Platelet   Comprehensive metabolic panel with GFR    Has the patient fasted?:   Yes   TSH   Hemoglobin A1c   Lipid panel    Has the patient fasted?:   Yes   Troponin T   D-Dimer, Quantitative    No follow-ups on file.   The patient was advised to call back or seek an in-person evaluation if the symptoms worsen or if the condition fails to improve as anticipated.  I discussed the assessment and treatment plan with the patient. The patient was provided an opportunity to ask questions and all were answered. The patient agreed with the plan and demonstrated an understanding of the instructions.  I, Ashanti Ratti, PA-C have reviewed  all documentation for this visit. The documentation on 08/30/2023  for the exam, diagnosis, procedures, and orders are all accurate and complete.  Blane Bunting, [facility], MMS [facility] [phone] (phone) [phone] (fax)  [facility]

## 2023-08-31 LAB — COMPREHENSIVE METABOLIC PANEL WITH GFR
ALT: 13 IU/L (ref 0–32)
AST: 21 IU/L (ref 0–40)
Albumin: 4.2 g/dL (ref 3.9–4.9)
Alkaline Phosphatase: 60 IU/L (ref 44–121)
BUN/Creatinine Ratio: 25 (ref 12–28)
BUN: 17 mg/dL (ref 8–27)
Bilirubin Total: 0.5 mg/dL (ref 0.0–1.2)
CO2: 23 mmol/L (ref 20–29)
Calcium: 9.9 mg/dL (ref 8.7–10.3)
Chloride: 99 mmol/L (ref 96–106)
Creatinine, Ser: 0.67 mg/dL (ref 0.57–1.00)
Globulin, Total: 3.9 g/dL (ref 1.5–4.5)
Glucose: 92 mg/dL (ref 70–99)
Potassium: 4.2 mmol/L (ref 3.5–5.2)
Sodium: 142 mmol/L (ref 134–144)
Total Protein: 8.1 g/dL (ref 6.0–8.5)
eGFR: 97 mL/min/{1.73_m2} (ref >59)

## 2023-08-31 LAB — CBC WITH DIFFERENTIAL/PLATELET
Basophils Absolute: 0 10*3/uL (ref 0.0–0.2)
Basos: 0 %
EOS (ABSOLUTE): 0.1 10*3/uL (ref 0.0–0.4)
Eos: 1 %
Hematocrit: 36.4 % (ref 34.0–46.6)
Hemoglobin: 12.1 g/dL (ref 11.1–15.9)
Immature Grans (Abs): 0 10*3/uL (ref 0.0–0.1)
Immature Granulocytes: 1 %
Lymphocytes Absolute: 1.5 10*3/uL (ref 0.7–3.1)
Lymphs: 21 %
MCH: 32.4 pg (ref 26.6–33.0)
MCHC: 33.2 g/dL (ref 31.5–35.7)
MCV: 98 fL — ABNORMAL HIGH (ref 79–97)
Monocytes Absolute: 0.5 10*3/uL (ref 0.1–0.9)
Monocytes: 7 %
Neutrophils Absolute: 5 10*3/uL (ref 1.4–7.0)
Neutrophils: 70 %
Platelets: 189 10*3/uL (ref 150–450)
RBC: 3.73 x10E6/uL — ABNORMAL LOW (ref 3.77–5.28)
RDW: 12.6 % (ref 11.7–15.4)
WBC: 7.1 10*3/uL (ref 3.4–10.8)

## 2023-08-31 LAB — LIPID PANEL
Chol/HDL Ratio: 2.9 ratio (ref 0.0–4.4)
Cholesterol, Total: 129 mg/dL (ref 100–199)
HDL: 44 mg/dL (ref >39)
LDL Chol Calc (NIH): 65 mg/dL (ref 0–99)
Triglycerides: 108 mg/dL (ref 0–149)
VLDL Cholesterol Cal: 20 mg/dL (ref 5–40)

## 2023-08-31 LAB — TROPONIN T: Troponin T (Highly Sensitive): 11 ng/L (ref 0–14)

## 2023-08-31 LAB — HEMOGLOBIN A1C
Est. average glucose Bld gHb Est-mCnc: 123 mg/dL
Hgb A1c MFr Bld: 5.9 % — ABNORMAL HIGH (ref 4.8–5.6)

## 2023-08-31 LAB — D-DIMER, QUANTITATIVE: D-DIMER: 0.24 mg{FEU}/L (ref 0.00–0.49)

## 2023-08-31 LAB — TSH: TSH: 1.79 u[IU]/mL (ref 0.450–4.500)

## 2023-09-01 DIAGNOSIS — R0789 Other chest pain: Secondary | ICD-10-CM | POA: Insufficient documentation

## 2023-09-01 DIAGNOSIS — J302 Other seasonal allergic rhinitis: Secondary | ICD-10-CM | POA: Insufficient documentation

## 2023-09-02 ENCOUNTER — Encounter: Payer: Self-pay | Admitting: Physician Assistant

## 2023-09-02 NOTE — Progress Notes (Signed)
 Please, Check with [facility] if we could add pro-bnp test ?

## 2023-09-10 ENCOUNTER — Ambulatory Visit: Admitting: Family Medicine

## 2023-09-10 ENCOUNTER — Encounter: Payer: Self-pay | Admitting: Family Medicine

## 2023-09-10 VITALS — BP 139/76 | HR 94 | Ht 61.0 in | Wt 185.7 lb

## 2023-09-10 DIAGNOSIS — F32A Depression, unspecified: Secondary | ICD-10-CM

## 2023-09-10 DIAGNOSIS — R9389 Abnormal findings on diagnostic imaging of other specified body structures: Secondary | ICD-10-CM

## 2023-09-10 DIAGNOSIS — R0789 Other chest pain: Secondary | ICD-10-CM | POA: Diagnosis not present

## 2023-09-10 DIAGNOSIS — I152 Hypertension secondary to endocrine disorders: Secondary | ICD-10-CM | POA: Diagnosis not present

## 2023-09-10 DIAGNOSIS — E1159 Type 2 diabetes mellitus with other circulatory complications: Secondary | ICD-10-CM

## 2023-09-10 DIAGNOSIS — F419 Anxiety disorder, unspecified: Secondary | ICD-10-CM

## 2023-09-10 DIAGNOSIS — Z7984 Long term (current) use of oral hypoglycemic drugs: Secondary | ICD-10-CM

## 2023-09-10 MED ORDER — ALPRAZOLAM 0.5 MG PO TABS: 0.2500 mg | ORAL_TABLET | Freq: Two times a day (BID) | ORAL | 0 refills | Status: DC | PRN

## 2023-09-10 NOTE — Progress Notes (Signed)
 Established patient visit   Patient: Dana Wallace   DOB: [birth year]   68 y.o. Female  MRN: [id number] Visit Date: 09/10/2023  Today's healthcare provider: Aden Agreste, MD   Chief Complaint  Patient presents with   Care Management    Patient is present to discuss abnormal lab and imaging results DEXA Scan declined Tetanus Immunization declined Cervical Cancer Screening - would like to update at next physical   Subjective    HPI HPI     Care Management    Additional comments: Patient is present to discuss abnormal lab and imaging results DEXA Scan declined Tetanus Immunization declined Cervical Cancer Screening - would like to update at next physical      Last edited by Pasty Bongo, CMA on 09/10/2023  2:01 PM.       Discussed the use of AI scribe software for clinical note transcription with the patient, who gave verbal consent to proceed.  History of Present Illness   The patient, with a history of diabetes and hypertension, presents with concerns about a recent chest x-ray that showed vascular congestion. She reports experiencing chest discomfort and shortness of breath, which she initially attributed to a muscle strain from a hard sneeze about five weeks ago. The discomfort, initially located in the left scapular area, moved to the center of her chest, particularly noticeable when taking deep breaths. The patient also mentions a dry cough, which she attributes to pollen. Despite these symptoms, she has been active, walking daily without experiencing any shortness of breath. She has been managing her diabetes with metformin  and glipizide , and her hypertension with hydrochlorothiazide  and lisinopril . She reports that her fasting blood sugar levels have been around 120-125, higher than when she was on a higher dose of glipizide .         Medications: Outpatient Medications Prior to Visit  Medication Sig   albuterol  (VENTOLIN  HFA) 108 (90 Base) MCG/ACT  inhaler Inhale 2 puffs into the lungs every 6 (six) hours as needed for wheezing or shortness of breath.   aspirin 81 MG tablet Take by mouth.   Blood Glucose Monitoring Suppl (ONE TOUCH ULTRA 2) w/Device KIT 1 each by Does not apply route daily at 2 PM.   glipiZIDE  (GLUCOTROL  XL) 5 MG 24 hr tablet TAKE 1 TABLET(5 MG) BY MOUTH DAILY WITH BREAKFAST   glucose blood (ONETOUCH ULTRA) test strip 1 each by Other route daily. Use as instructed   hydrochlorothiazide  (MICROZIDE ) 12.5 MG capsule TAKE 1 CAPSULE(12.5 MG) BY MOUTH DAILY   ibuprofen (ADVIL,MOTRIN) 200 MG tablet    lisinopril  (ZESTRIL ) 20 MG tablet Take 1 tablet (20 mg total) by mouth daily.   metFORMIN  (GLUCOPHAGE ) 1000 MG tablet Take 1 tablet (1,000 mg total) by mouth 2 (two) times daily with a meal.   Multiple Vitamin tablet    Omega-3 Fatty Acids (FISH OIL) 1200 MG CPDR Take by mouth daily.   simvastatin  (ZOCOR ) 10 MG tablet Take 1 tablet (10 mg total) by mouth daily at 6 PM.   [DISCONTINUED] ALPRAZolam  (XANAX ) 0.5 MG tablet TAKE 1/2 TO 1 TABLET BY MOUTH TWICE DAILY AS NEEDED   No facility-administered medications prior to visit.    Review of Systems     Objective    BP 139/76   Pulse 94   Ht 5\' 1"  (1.549 m)   Wt 185 lb 11.2 oz (84.2 kg)   SpO2 99%   BMI 35.09 kg/m    Physical Exam Vitals reviewed.  Constitutional:      General: She is not in acute distress.    Appearance: Normal appearance. She is well-developed. She is not diaphoretic.  HENT:     Head: Normocephalic and atraumatic.  Eyes:     General: No scleral icterus.    Conjunctiva/sclera: Conjunctivae normal.  Neck:     Thyroid: No thyromegaly.  Cardiovascular:     Rate and Rhythm: Normal rate and regular rhythm.     Heart sounds: Normal heart sounds. No murmur heard. Pulmonary:     Effort: Pulmonary effort is normal. No respiratory distress.     Breath sounds: Normal breath sounds. No wheezing, rhonchi or rales.  Musculoskeletal:     Cervical back: Neck  supple.     Right lower leg: No edema.     Left lower leg: No edema.  Lymphadenopathy:     Cervical: No cervical adenopathy.  Skin:    General: Skin is warm and dry.     Findings: No rash.  Neurological:     Mental Status: She is alert and oriented to person, place, and time. Mental status is at baseline.  Psychiatric:        Mood and Affect: Mood normal.        Behavior: Behavior normal.      No results found for any visits on 09/10/23.  Assessment & Plan     Problem List Items Addressed This Visit       Cardiovascular and Mediastinum   Hypertension associated with diabetes (HCC)   Relevant Orders   ECHOCARDIOGRAM COMPLETE     Other   Chest discomfort   Relevant Orders   ECHOCARDIOGRAM COMPLETE   Other Visit Diagnoses       Abnormal chest x-ray    -  Primary   Relevant Orders   ECHOCARDIOGRAM COMPLETE     Clinical depression       Relevant Medications   ALPRAZolam  (XANAX ) 0.5 MG tablet     Acute anxiety       Relevant Medications   ALPRAZolam  (XANAX ) 0.5 MG tablet           Vascular congestion in lungs Chest x-ray revealed vascular congestion, suggestive of heart failure, but she exhibits no clinical symptoms such as dyspnea or edema. Lungs are clear on exam.  Troponin and D-dimer tests were negative. An echocardiogram is necessary to evaluate cardiac function and exclude heart failure. She is anxious about the findings and has been reassured that the clinical presentation does not align with the x-ray results. The echocardiogram will clarify heart function, including systolic and diastolic performance. - Order echocardiogram to assess cardiac function and exclude heart failure. - Reassure and support her regarding anxiety related to health concerns. - Prescribe alprazolam  for anxiety management (has used very sparingly in the past as well).  Hypertension Blood pressure is managed with lisinopril  and hydrochlorothiazide . Heart rate is in the 90s, which is  normal. No medication adjustments are needed as there are no symptoms of heart failure or fluid overload. She is on simvastatin  for cholesterol management. - Continue lisinopril  and hydrochlorothiazide  for hypertension management. - Continue simvastatin  for cholesterol management.  Type 2 diabetes mellitus without complications A1c is 5.9. Fasting blood glucose levels have increased to 120-125 mg/dL. Current medications include metformin  and glipizide . She has lost weight and engages in regular physical activity. No need to increase glipizide  dosage.  - Continue metformin  and glipizide  for diabetes management. - Encourage continued physical activity and weight management.  General  Health Maintenance She is due for a mammogram, with the last one over a year ago. Insurance coverage for mammograms is a concern. - Advise scheduling a mammogram at an in-network facility.  Follow-up A follow-up appointment is scheduled for Oct 03, 2023. Plan to review echocardiogram results before this appointment if possible. - Maintain follow-up appointment on Oct 03, 2023. - Notify her of echocardiogram results as soon as they are available.        No follow-ups on file.       Total time spent on today's visit was greater than 40 minutes, including both face-to-face time and nonface-to-face time personally spent on review of chart (labs and imaging), discussing labs and goals, discussing further work-up, treatment options, answering patient's questions, and coordinating care.   Aden Agreste, MD  [facility] [phone] (phone) [phone] (fax)  [facility]

## 2023-09-17 ENCOUNTER — Other Ambulatory Visit: Payer: Self-pay | Admitting: Family Medicine

## 2023-09-17 DIAGNOSIS — E782 Mixed hyperlipidemia: Secondary | ICD-10-CM

## 2023-09-19 NOTE — Telephone Encounter (Signed)
 Per lab 08/30/23 and OV 09/10/23- patient to continue - will refill- although request is early Requested Prescriptions  Pending Prescriptions Disp Refills   simvastatin  (ZOCOR ) 10 MG tablet [Pharmacy Med Name: SIMVASTATIN  10MG  TABLETS] 90 tablet 3    Sig: TAKE 1 TABLET(10 MG) BY MOUTH DAILY AT 6 PM     Cardiovascular:  Antilipid - Statins Failed - 09/19/2023 12:21 PM      Failed - Valid encounter within last 12 months    Recent Outpatient Visits           1 week ago Abnormal chest x-ray   [facility] [place], Stan Eans, MD   2 weeks ago Chest discomfort   [facility] [place], Janna, PA-C              Failed - Lipid Panel in normal range within the last 12 months    Cholesterol, Total  Date Value Ref Range Status  08/30/2023 129 100 - 199 mg/dL Final   LDL Chol Calc (NIH)  Date Value Ref Range Status  08/30/2023 65 0 - 99 mg/dL Final   HDL  Date Value Ref Range Status  08/30/2023 44 >39 mg/dL Final   Triglycerides  Date Value Ref Range Status  08/30/2023 108 0 - 149 mg/dL Final         Passed - Patient is not pregnant

## 2023-09-21 ENCOUNTER — Other Ambulatory Visit: Payer: Self-pay | Admitting: Family Medicine

## 2023-09-21 DIAGNOSIS — I152 Hypertension secondary to endocrine disorders: Secondary | ICD-10-CM

## 2023-09-21 DIAGNOSIS — E1165 Type 2 diabetes mellitus with hyperglycemia: Secondary | ICD-10-CM

## 2023-10-03 ENCOUNTER — Encounter: Payer: Self-pay | Admitting: Family Medicine

## 2023-10-03 ENCOUNTER — Ambulatory Visit (INDEPENDENT_AMBULATORY_CARE_PROVIDER_SITE_OTHER): Payer: Self-pay | Admitting: Family Medicine

## 2023-10-03 VITALS — BP 132/76 | HR 73 | Ht 61.0 in | Wt 187.8 lb

## 2023-10-03 DIAGNOSIS — Z1231 Encounter for screening mammogram for malignant neoplasm of breast: Secondary | ICD-10-CM

## 2023-10-03 DIAGNOSIS — Z0000 Encounter for general adult medical examination without abnormal findings: Secondary | ICD-10-CM

## 2023-10-03 DIAGNOSIS — Z780 Asymptomatic menopausal state: Secondary | ICD-10-CM

## 2023-10-03 NOTE — Progress Notes (Signed)
 Medicare Initial Preventative Physical Exam    Patient: Dana Wallace, Female    DOB: 1955-11-15, 68 y.o.   MRN: [id number] Visit Date: 10/03/2023  Today's Provider: Aden Agreste, MD   Chief Complaint  Patient presents with   Annual Exam   Subjective    Medicare Initial Preventative Physical Exam Dana Wallace is a 68 y.o. female who presents today for her Initial Preventative Physical Exam.   Discussed the use of AI scribe software for clinical note transcription with the patient, who gave verbal consent to proceed.  History of Present Illness   Dana Wallace is a 68 year old female who presents for a Welcome to Medicare visit.  She experiences chest discomfort described as a sore breast muscle, noticeable with deep breaths or hard coughs. No shortness of breath is present. She recently had an EKG and chest x-ray, with an echocardiogram scheduled later this month.  She uses reading glasses for small print and has difficulty hearing in noisy environments, though general conversation is unaffected.  She experiences occasional urine leakage with sneezing or coughing, not impacting her quality of life.  She maintains a daily walking routine of 30 minutes and focuses on lowering her blood sugar levels. Fasting blood sugars range between 114 and 130, with a recent A1c of 5.9. She is on a reduced dose of glipizide .  She has completed her pneumonia and shingles vaccinations. Plans are in place to schedule a mammogram and bone density scan, considering insurance coverage.  She underwent a memory test during the visit and completed it successfully. No significant memory changes are noted.         Social History   Socioeconomic History   Marital status: Married    Spouse name: Not on file   Number of children: Not on file   Years of education: Not on file   Highest education level: Associate degree: academic program  Occupational History   Not on file   Tobacco Use   Smoking status: Never   Smokeless tobacco: Never  Substance and Sexual Activity   Alcohol use: No   Drug use: Never   Sexual activity: Not Currently  Other Topics Concern   Not on file  Social History Narrative   Not on file   Social Drivers of Health   Financial Resource Strain: Low Risk  (08/29/2023)   Overall Financial Resource Strain (CARDIA)    Difficulty of Paying Living Expenses: Not hard at all  Food Insecurity: No Food Insecurity (08/29/2023)   Hunger Vital Sign    Worried About Running Out of Food in the Last Year: Never true    Ran Out of Food in the Last Year: Never true  Transportation Needs: No Transportation Needs (08/29/2023)   PRAPARE - Administrator, Civil Service (Medical): No    Lack of Transportation (Non-Medical): No  Physical Activity: Sufficiently Active (10/03/2023)   Exercise Vital Sign    Days of Exercise per Week: 6 days    Minutes of Exercise per Session: 30 min  Recent Concern: Physical Activity - Inactive (08/29/2023)   Exercise Vital Sign    Days of Exercise per Week: 0 days    Minutes of Exercise per Session: 30 min  Stress: Stress Concern Present (08/29/2023)   [employer] of Occupational Health - Occupational Stress Questionnaire    Feeling of Stress : To some extent  Social Connections: Moderately Isolated (08/29/2023)   Social Connection and Isolation Panel [NHANES]  Frequency of Communication with Friends and Family: More than three times a week    Frequency of Social Gatherings with Friends and Family: Once a week    Attends Religious Services: Never    Database administrator or Organizations: No    Attends Engineer, structural: Not on file    Marital Status: Married  Catering manager Violence: Not At Risk (10/03/2023)   Humiliation, Afraid, Rape, and Kick questionnaire    Fear of Current or Ex-Partner: No    Emotionally Abused: No    Physically Abused: No    Sexually Abused: No    Past  Medical History:  Diagnosis Date   Anxiety    Diabetes mellitus without complication (HCC)    Hypertension      Patient Active Problem List   Diagnosis Date Noted   Seasonal allergies 09/01/2023   Chest discomfort 09/01/2023   Hyperlipidemia associated with type 2 diabetes mellitus (HCC) 01/16/2021   Morbid obesity (HCC) 02/29/2020   T2DM (type 2 diabetes mellitus) (HCC) 10/05/2015   Hypertension associated with diabetes (HCC) 04/26/2015    Past Surgical History:  Procedure Laterality Date   CESAREAN SECTION     CHOLECYSTECTOMY     TUBAL LIGATION      Her family history includes COPD in her mother; Cancer in her sister; Hodgkin's lymphoma in her sister; Multiple sclerosis in her mother; Varicose Veins in her mother. There is no history of Breast cancer.   Current Outpatient Medications:    albuterol  (VENTOLIN  HFA) 108 (90 Base) MCG/ACT inhaler, Inhale 2 puffs into the lungs every 6 (six) hours as needed for wheezing or shortness of breath., Disp: 8 g, Rfl: 2   ALPRAZolam  (XANAX ) 0.5 MG tablet, Take 0.5-1 tablets (0.25-0.5 mg total) by mouth 2 (two) times daily as needed for anxiety., Disp: 15 tablet, Rfl: 0   aspirin 81 MG tablet, Take by mouth., Disp: , Rfl:    Blood Glucose Monitoring Suppl (ONE TOUCH ULTRA 2) w/Device KIT, 1 each by Does not apply route daily at 2 PM., Disp: 1 kit, Rfl: 0   glipiZIDE  (GLUCOTROL  XL) 5 MG 24 hr tablet, TAKE 1 TABLET(5 MG) BY MOUTH DAILY WITH BREAKFAST, Disp: 90 tablet, Rfl: 1   glucose blood (ONETOUCH ULTRA) test strip, 1 each by Other route daily. Use as instructed, Disp: 100 each, Rfl: 12   hydrochlorothiazide  (MICROZIDE ) 12.5 MG capsule, TAKE 1 CAPSULE(12.5 MG) BY MOUTH DAILY, Disp: 90 capsule, Rfl: 0   ibuprofen (ADVIL,MOTRIN) 200 MG tablet, , Disp: , Rfl:    latanoprost (XALATAN) 0.005 % ophthalmic solution, 1 drop at bedtime., Disp: , Rfl:    lisinopril  (ZESTRIL ) 20 MG tablet, TAKE 1 TABLET(20 MG) BY MOUTH DAILY, Disp: 90 tablet, Rfl: 3    metFORMIN  (GLUCOPHAGE ) 1000 MG tablet, Take 1 tablet (1,000 mg total) by mouth 2 (two) times daily with a meal., Disp: 180 tablet, Rfl: 3   Multiple Vitamin tablet, , Disp: , Rfl:    Omega-3 Fatty Acids (FISH OIL) 1200 MG CPDR, Take by mouth daily., Disp: , Rfl:    simvastatin  (ZOCOR ) 10 MG tablet, TAKE 1 TABLET(10 MG) BY MOUTH DAILY AT 6 PM, Disp: 90 tablet, Rfl: 2   triamcinolone cream (KENALOG) 0.1 %, Apply topically 2 (two) times daily., Disp: , Rfl:    Patient Care Team: Mazie Speed, MD as PCP - General (Family Medicine)  Review of Systems     Objective    Vitals: BP 132/76 (BP Location: Left Arm,  Patient Position: Sitting, Cuff Size: Normal)   Pulse 73   Ht 5\' 1"  (1.549 m)   Wt 187 lb 12.8 oz (85.2 kg)   SpO2 98%   BMI 35.48 kg/m  No results found. Physical Exam Vitals reviewed.  Constitutional:      General: She is not in acute distress.    Appearance: Normal appearance. She is well-developed. She is not diaphoretic.  HENT:     Head: Normocephalic and atraumatic.     Right Ear: Tympanic membrane, ear canal and external ear normal.     Left Ear: Tympanic membrane, ear canal and external ear normal.     Nose: Nose normal.     Mouth/Throat:     Mouth: Mucous membranes are moist.     Pharynx: Oropharynx is clear. No oropharyngeal exudate.  Eyes:     General: No scleral icterus.    Conjunctiva/sclera: Conjunctivae normal.     Pupils: Pupils are equal, round, and reactive to light.  Neck:     Thyroid: No thyromegaly.  Cardiovascular:     Rate and Rhythm: Normal rate and regular rhythm.     Heart sounds: Normal heart sounds. No murmur heard. Pulmonary:     Effort: Pulmonary effort is normal. No respiratory distress.     Breath sounds: Normal breath sounds. No wheezing or rales.  Abdominal:     General: There is no distension.     Palpations: Abdomen is soft.     Tenderness: There is no abdominal tenderness.  Musculoskeletal:        General: No deformity.      Cervical back: Neck supple.     Right lower leg: No edema.     Left lower leg: No edema.  Lymphadenopathy:     Cervical: No cervical adenopathy.  Skin:    General: Skin is warm and dry.     Findings: No rash.  Neurological:     Mental Status: She is alert and oriented to person, place, and time. Mental status is at baseline.     Gait: Gait normal.  Psychiatric:        Mood and Affect: Mood normal.        Behavior: Behavior normal.        Thought Content: Thought content normal.      Activities of Daily Living    10/03/2023    9:03 AM  In your present state of health, do you have any difficulty performing the following activities:  Hearing? 1  Vision? 1  Difficulty concentrating or making decisions? 0  Walking or climbing stairs? 0  Dressing or bathing? 0  Doing errands, shopping? 0  Preparing Food and eating ? N  Using the Toilet? N  In the past six months, have you accidently leaked urine? Y  Do you have problems with loss of bowel control? N  Managing your Medications? N  Managing your Finances? N  Housekeeping or managing your Housekeeping? N    Fall Risk Assessment    09/28/2022    8:18 AM 03/26/2022   10:28 AM 01/16/2021    3:10 PM 08/07/2019    4:23 PM  Fall Risk   Falls in the past year? 0 0 1 0  Number falls in past yr: 0 0 0 0  Injury with Fall? 0 0 1 0  Risk for fall due to : No Fall Risks No Fall Risks No Fall Risks   Follow up Falls evaluation completed Falls evaluation completed  Falls evaluation  completed     Depression Screen    08/30/2023    2:56 PM 08/30/2023    2:55 PM 09/28/2022    8:19 AM 03/26/2022   10:29 AM  PHQ 2/9 Scores  PHQ - 2 Score 0 0 0 0  PHQ- 9 Score 2 2 0 0       10/03/2023    9:05 AM  6CIT Screen  What Year? 0 points  What month? 0 points  What time? 3 points  Count back from 20 0 points  Months in reverse 0 points  Repeat phrase 2 points  Total Score 5 points    EKG: patient declined, due to recent EKG  No  results found.   No results found for any visits on 10/03/23.  Assessment & Plan      Initial Preventative Physical Exam  Reviewed patient's Family Medical History Reviewed and updated list of patient's medical providers Assessment of cognitive impairment was done Assessed patient's functional ability Established a written schedule for health screening services Health Risk Assessent Completed and Reviewed  Exercise Activities and Dietary recommendations  Goals   None     Immunization History  Administered Date(s) Administered   PNEUMOCOCCAL CONJUGATE-20 04/04/2023   PPD Test 04/18/2020   Td 01/02/1990   Zoster Recombinant(Shingrix ) 03/26/2022, 09/28/2022    Health Maintenance  Topic Date Due   Cervical Cancer Screening (HPV/Pap Cotest)  Never done   DTaP/Tdap/Td (2 - Tdap) 01/03/2000   DEXA SCAN  Never done   COVID-19 Vaccine (1 - 2024-25 season) Never done   INFLUENZA VACCINE  12/20/2023   HEMOGLOBIN A1C  02/29/2024   Diabetic kidney evaluation - Urine ACR  04/03/2024   FOOT EXAM  04/03/2024   OPHTHALMOLOGY EXAM  05/07/2024   MAMMOGRAM  05/24/2024   Diabetic kidney evaluation - eGFR measurement  08/29/2024   Medicare Annual Wellness (AWV)  10/02/2024   Fecal DNA (Cologuard)  04/02/2025   Pneumonia Vaccine 73+ Years old  Completed   Hepatitis C Screening  Completed   HIV Screening  Completed   Zoster Vaccines- Shingrix   Completed   HPV VACCINES  Aged Out   Meningococcal B Vaccine  Aged Out     Discussed health benefits of physical activity, and encouraged her to engage in regular exercise appropriate for her age and condition.   Problem List Items Addressed This Visit   None Visit Diagnoses       Welcome to Medicare preventive visit    -  Primary     Breast cancer screening by mammogram       Relevant Orders   MM 3D SCREENING MAMMOGRAM BILATERAL BREAST     Postmenopausal       Relevant Orders   DG Bone Density          Breast pain Intermittent  breast pain, likely musculoskeletal due to heavy breasts. No associated dyspnea or cardiac symptoms. - Await echocardiogram results. - Schedule mammogram. - Consider better supportive bra or breast reduction if pain persists.  Diabetes mellitus type 2 Type 2 diabetes managed with glipizide  5 mg. A1c is 5.9. Fasting blood glucose ranges from 100 to 130 mg/dL. Discussed hypoglycemia risks with higher glipizide  doses and importance of maintaining optimal A1c levels. - Continue glipizide  5 mg. - Monitor blood glucose levels. - Re-evaluate A1c in 6 months.  Hearing loss Mild hearing difficulty in noisy environments. No significant impact on daily activities. Increased risk of requiring hearing aids post-65 years. - Monitor hearing. - Consider  audiology referral if hearing worsens.  Urinary incontinence Occasional urinary leakage with sneezing or coughing, not affecting quality of life. - Monitor symptoms.  General Health Maintenance Routine screenings and vaccinations discussed. Orders for mammogram and bone density scan sent to [facility]. RSV and tetanus vaccines recommended with discussion on benefits and pharmacy administration. - Schedule mammogram and bone density scan at [facility]. - Consider RSV and tetanus vaccines at the pharmacy.       Return in about 6 months (around 04/04/2024) for chronic disease f/u.     Aden Agreste, MD  [facility] [phone] (phone) [phone] (fax)  [facility]

## 2023-10-03 NOTE — Patient Instructions (Signed)
 Call [facility] to schedule a mammogram [phone]

## 2023-10-15 ENCOUNTER — Ambulatory Visit
Admission: RE | Admit: 2023-10-15 | Discharge: 2023-10-15 | Disposition: A | Discharge status: Home / Self Care | Source: Ambulatory Visit | Attending: Family Medicine | Admitting: Family Medicine

## 2023-10-15 ENCOUNTER — Ambulatory Visit: Payer: Self-pay | Admitting: Family Medicine

## 2023-10-15 DIAGNOSIS — E1159 Type 2 diabetes mellitus with other circulatory complications: Secondary | ICD-10-CM

## 2023-10-15 DIAGNOSIS — R0789 Other chest pain: Secondary | ICD-10-CM

## 2023-10-15 DIAGNOSIS — I340 Nonrheumatic mitral (valve) insufficiency: Secondary | ICD-10-CM | POA: Diagnosis not present

## 2023-10-15 DIAGNOSIS — E119 Type 2 diabetes mellitus without complications: Secondary | ICD-10-CM | POA: Insufficient documentation

## 2023-10-15 DIAGNOSIS — R9389 Abnormal findings on diagnostic imaging of other specified body structures: Secondary | ICD-10-CM | POA: Diagnosis not present

## 2023-10-15 DIAGNOSIS — I10 Essential (primary) hypertension: Secondary | ICD-10-CM | POA: Insufficient documentation

## 2023-10-15 LAB — ECHOCARDIOGRAM COMPLETE
AR max vel: 1.86 cm2
AV Area VTI: 2.24 cm2
AV Area mean vel: 1.83 cm2
AV Mean grad: 4.5 mmHg
AV Peak grad: 8.3 mmHg
Ao pk vel: 1.44 m/s
Area-P 1/2: 4.39 cm2
S' Lateral: 3.8 cm

## 2023-10-15 NOTE — Progress Notes (Signed)
*  PRELIMINARY RESULTS* Echocardiogram 2D Echocardiogram has been performed.  Dana Wallace 10/15/2023, 9:26 AM

## 2023-10-22 DIAGNOSIS — H40023 Open angle with borderline findings, high risk, bilateral: Secondary | ICD-10-CM | POA: Diagnosis not present

## 2023-10-22 DIAGNOSIS — H2513 Age-related nuclear cataract, bilateral: Secondary | ICD-10-CM | POA: Diagnosis not present

## 2023-10-22 DIAGNOSIS — H40053 Ocular hypertension, bilateral: Secondary | ICD-10-CM | POA: Diagnosis not present

## 2023-11-06 ENCOUNTER — Ambulatory Visit
Admission: RE | Admit: 2023-11-06 | Discharge: 2023-11-06 | Disposition: A | Discharge status: Home / Self Care | Source: Ambulatory Visit | Attending: Family Medicine | Admitting: Family Medicine

## 2023-11-06 DIAGNOSIS — Z1231 Encounter for screening mammogram for malignant neoplasm of breast: Secondary | ICD-10-CM | POA: Diagnosis not present

## 2023-11-06 DIAGNOSIS — Z780 Asymptomatic menopausal state: Secondary | ICD-10-CM | POA: Insufficient documentation

## 2023-11-08 ENCOUNTER — Ambulatory Visit: Payer: Self-pay | Admitting: Family Medicine

## 2023-11-18 DIAGNOSIS — H2513 Age-related nuclear cataract, bilateral: Secondary | ICD-10-CM | POA: Diagnosis not present

## 2023-11-18 DIAGNOSIS — H40013 Open angle with borderline findings, low risk, bilateral: Secondary | ICD-10-CM | POA: Diagnosis not present

## 2023-11-18 DIAGNOSIS — H40053 Ocular hypertension, bilateral: Secondary | ICD-10-CM | POA: Diagnosis not present

## 2023-11-26 ENCOUNTER — Ambulatory Visit: Payer: Self-pay

## 2023-11-26 ENCOUNTER — Encounter: Payer: Self-pay | Admitting: Family Medicine

## 2023-11-26 NOTE — Telephone Encounter (Signed)
 FYI Only or Action Required?: Action required by provider: update on patient condition.  Patient was last seen in primary care on 10/03/2023 by Myrla Jon HERO, MD.  Called Nurse Triage reporting Breast Pain and Advice Only.  Symptoms began several months ago.  Interventions attempted: OTC medications: tylenol/ibuprofen.  Symptoms are: unchanged.  Triage Disposition: Call PCP When Office is Open  Patient/caregiver understands and will follow disposition?: Yes   Copied from CRM [id number]. Topic: Clinical - Red Word Triage >> Nov 26, 2023 12:52 PM Charlet HERO wrote: Red Word that prompted transfer to Nurse Triage: Pa[facility] is calling about joint and chest pain has appt scheduled for November and is stating  that the pain is not getting better since last visit. Reason for Disposition  [1] Caller requesting NON-URGENT health information AND [2] PCP's office is the best resource  Answer Assessment - Initial Assessment Questions 1. REASON FOR CALL or QUESTION: What is your reason for calling today? or How can I best help you? or What question do you have that I can help answer?     Patient reports that she has had no improvement in her symptoms and that all of her test have come back negative ruling out any cardiac or breast malignancies.   She has done research and would like to know if it is possible if she may have costochondritis.  She would like to know if Dr. Bryan would consider prescribing a steroid and possibly an antibiotic to see if this would relieve her symptoms.   Patient is currently alternating tylenol and ibuprofen for pain, giving minimal relief.  Please contact patient via mychart with any questions or updates  Protocols used: Information Only Call - No Triage-A-AH

## 2023-11-27 ENCOUNTER — Other Ambulatory Visit: Payer: Self-pay | Admitting: Family Medicine

## 2023-11-27 DIAGNOSIS — M940 Chondrocostal junction syndrome [Tietze]: Secondary | ICD-10-CM

## 2023-11-27 MED ORDER — METHYLPREDNISOLONE 4 MG PO TBPK: ORAL_TABLET | ORAL | 0 refills | Status: DC

## 2023-11-29 ENCOUNTER — Other Ambulatory Visit: Payer: Self-pay | Admitting: Family Medicine

## 2023-11-29 DIAGNOSIS — I152 Hypertension secondary to endocrine disorders: Secondary | ICD-10-CM

## 2023-12-02 DIAGNOSIS — M899 Disorder of bone, unspecified: Secondary | ICD-10-CM | POA: Diagnosis not present

## 2023-12-02 DIAGNOSIS — M898X1 Other specified disorders of bone, shoulder: Secondary | ICD-10-CM | POA: Diagnosis not present

## 2023-12-02 DIAGNOSIS — M25511 Pain in right shoulder: Secondary | ICD-10-CM | POA: Diagnosis not present

## 2023-12-03 ENCOUNTER — Other Ambulatory Visit: Payer: Self-pay | Admitting: Family Medicine

## 2023-12-03 DIAGNOSIS — E1159 Type 2 diabetes mellitus with other circulatory complications: Secondary | ICD-10-CM

## 2023-12-06 DIAGNOSIS — M25511 Pain in right shoulder: Secondary | ICD-10-CM | POA: Diagnosis not present

## 2023-12-06 DIAGNOSIS — M898X1 Other specified disorders of bone, shoulder: Secondary | ICD-10-CM | POA: Diagnosis not present

## 2023-12-09 ENCOUNTER — Telehealth: Payer: Self-pay | Admitting: Oncology

## 2023-12-09 ENCOUNTER — Inpatient Hospital Stay
Admission: RE | Admit: 2023-12-09 | Discharge: 2023-12-09 | Disposition: A | Discharge status: Home / Self Care | Payer: Self-pay | Source: Ambulatory Visit | Attending: Oncology | Admitting: Oncology

## 2023-12-09 ENCOUNTER — Encounter: Payer: Self-pay | Admitting: Oncology

## 2023-12-09 ENCOUNTER — Inpatient Hospital Stay: Attending: Oncology | Admitting: Oncology

## 2023-12-09 ENCOUNTER — Inpatient Hospital Stay

## 2023-12-09 VITALS — BP 126/69 | HR 97 | Temp 98.6°F | Resp 18 | Wt 189.8 lb

## 2023-12-09 DIAGNOSIS — Z79899 Other long term (current) drug therapy: Secondary | ICD-10-CM | POA: Diagnosis not present

## 2023-12-09 DIAGNOSIS — M898X9 Other specified disorders of bone, unspecified site: Secondary | ICD-10-CM

## 2023-12-09 DIAGNOSIS — D539 Nutritional anemia, unspecified: Secondary | ICD-10-CM | POA: Diagnosis not present

## 2023-12-09 DIAGNOSIS — C7951 Secondary malignant neoplasm of bone: Secondary | ICD-10-CM | POA: Diagnosis not present

## 2023-12-09 DIAGNOSIS — G893 Neoplasm related pain (acute) (chronic): Secondary | ICD-10-CM | POA: Diagnosis not present

## 2023-12-09 DIAGNOSIS — Z807 Family history of other malignant neoplasms of lymphoid, hematopoietic and related tissues: Secondary | ICD-10-CM | POA: Diagnosis not present

## 2023-12-09 DIAGNOSIS — R5383 Other fatigue: Secondary | ICD-10-CM | POA: Diagnosis not present

## 2023-12-09 DIAGNOSIS — C9000 Multiple myeloma not having achieved remission: Secondary | ICD-10-CM | POA: Insufficient documentation

## 2023-12-09 LAB — CBC WITH DIFFERENTIAL/PLATELET
Abs Immature Granulocytes: 0.02 K/uL (ref 0.00–0.07)
Basophils Absolute: 0 K/uL (ref 0.0–0.1)
Basophils Relative: 0 %
Eosinophils Absolute: 0.1 K/uL (ref 0.0–0.5)
Eosinophils Relative: 2 %
HCT: 29 % — ABNORMAL LOW (ref 36.0–46.0)
Hemoglobin: 9.3 g/dL — ABNORMAL LOW (ref 12.0–15.0)
Immature Granulocytes: 0 %
Lymphocytes Relative: 22 %
Lymphs Abs: 1.4 K/uL (ref 0.7–4.0)
MCH: 33.6 pg (ref 26.0–34.0)
MCHC: 32.1 g/dL (ref 30.0–36.0)
MCV: 104.7 fL — ABNORMAL HIGH (ref 80.0–100.0)
Monocytes Absolute: 0.6 K/uL (ref 0.1–1.0)
Monocytes Relative: 9 %
Neutro Abs: 4.3 K/uL (ref 1.7–7.7)
Neutrophils Relative %: 67 %
Platelets: 152 K/uL (ref 150–400)
RBC: 2.77 MIL/uL — ABNORMAL LOW (ref 3.87–5.11)
RDW: 15.9 % — ABNORMAL HIGH (ref 11.5–15.5)
WBC: 6.4 K/uL (ref 4.0–10.5)
nRBC: 0 % (ref 0.0–0.2)

## 2023-12-09 LAB — COMPREHENSIVE METABOLIC PANEL WITH GFR
ALT: 15 U/L (ref 0–44)
AST: 24 U/L (ref 15–41)
Albumin: 3.2 g/dL — ABNORMAL LOW (ref 3.5–5.0)
Alkaline Phosphatase: 41 U/L (ref 38–126)
Anion gap: 13 (ref 5–15)
BUN: 24 mg/dL — ABNORMAL HIGH (ref 8–23)
CO2: 26 mmol/L (ref 22–32)
Calcium: 10.1 mg/dL (ref 8.9–10.3)
Chloride: 99 mmol/L (ref 98–111)
Creatinine, Ser: 0.83 mg/dL (ref 0.44–1.00)
GFR, Estimated: >60 mL/min (ref >60)
Glucose, Bld: 119 mg/dL — ABNORMAL HIGH (ref 70–99)
Potassium: 4.4 mmol/L (ref 3.5–5.1)
Sodium: 138 mmol/L (ref 135–145)
Total Bilirubin: 0.5 mg/dL (ref 0.0–1.2)
Total Protein: 9.8 g/dL — ABNORMAL HIGH (ref 6.5–8.1)

## 2023-12-09 LAB — HEPATITIS PANEL, ACUTE
HCV Ab: NONREACTIVE
Hep A IgM: NONREACTIVE
Hep B C IgM: NONREACTIVE
Hepatitis B Surface Ag: NONREACTIVE

## 2023-12-09 LAB — LACTATE DEHYDROGENASE: LDH: 81 U/L — ABNORMAL LOW (ref 98–192)

## 2023-12-09 LAB — HIV ANTIBODY (ROUTINE TESTING W REFLEX): HIV Screen 4th Generation wRfx: NONREACTIVE

## 2023-12-09 NOTE — Progress Notes (Signed)
 Hematology/Oncology Consult Note [phone] [phone]     REFERRING PROVIDER: Leora Lynwood SAUNDERS, MD    CHIEF COMPLAINTS/PURPOSE OF CONSULTATION:  Metastatic bone lesions.   ASSESSMENT & PLAN:   Cancer, metastatic to bone (HCC) Check cbc cmp multiple myeloma panel, light chain ratio, beta 2 microglobulin, LDH.     Neoplasm related pain Recommend patient to continue Norco 5/325mg  Q4-6hour PRN.   Macrocytic anemia Pending above work up   Orders Placed This Encounter  Procedures   CT CHEST ABDOMEN PELVIS W CONTRAST    Needs STAT read    Standing Status:   Future    Expected Date:   12/17/2023    Expiration Date:   12/08/2024    If indicated for the ordered procedure, I authorize the administration of contrast media per Radiology protocol:   Yes    Does the patient have a contrast media/X-ray dye allergy?:   No    Preferred imaging location?:   [facility]    If indicated for the ordered procedure, I authorize the administration of oral contrast media per Radiology protocol:   Yes   MR OUTSIDE FILMS BODY/ABD/PELVIS    MRI clavicle/ humerus/ shoulders from [facility]    Standing Status:   Future    Number of Occurrences:   1    Expiration Date:   12/08/2024    [facility]   Comprehensive metabolic panel with GFR    Standing Status:   Future    Number of Occurrences:   1    Expected Date:   12/09/2023    Expiration Date:   03/08/2024   CBC with Differential/Platelet    Standing Status:   Future    Number of Occurrences:   1    Expected Date:   12/09/2023    Expiration Date:   03/08/2024   Multiple Myeloma Panel (SPEP&IFE w/QIG)    Standing Status:   Future    Number of Occurrences:   1    Expected Date:   12/09/2023    Expiration Date:   03/08/2024   Kappa/lambda light chains    Standing Status:   Future    Number of Occurrences:   1    Expected Date:   12/09/2023    Expiration Date:   03/08/2024    Lactate dehydrogenase    Standing Status:   Future    Number of Occurrences:   1    Expected Date:   12/09/2023    Expiration Date:   03/08/2024   HIV Antibody (routine testing w rflx)    Standing Status:   Future    Number of Occurrences:   1    Expected Date:   12/09/2023    Expiration Date:   03/08/2024   Hepatitis panel, acute    Standing Status:   Future    Number of Occurrences:   1    Expected Date:   12/09/2023    Expiration Date:   03/08/2024   Beta 2 microglobulin, serum    Standing Status:   Future    Number of Occurrences:   1    Expected Date:   12/09/2023    Expiration Date:   12/08/2024    All questions were answered. The patient knows to call the clinic with any problems, questions or concerns.  Zelphia Cap, MD, PhD [facility] Hematology Oncology 12/09/2023    HISTORY OF PRESENTING ILLNESS:  Dana Wallace 68 y.o.  female presents to establish care for metastatic bone lesions.   Discussed the use of AI scribe software for clinical note transcription with the patient, who gave verbal consent to proceed.   She initially experienced chest discomfort starting in her left shoulder in February, which persisted and led her to seek medical attention in April. Despite a comprehensive workup including a chest x-ray, EKG, echocardiogram, mammogram, and a DEXA scan, all results were normal.  In May, she had two follow-up visits with her primary care physician. The discomfort was primarily located behind her left breast and later moved to her right clavicle and sternum area, becoming very painful and tender to touch. An x-ray at [facility] was performed, and the patient was told there was some type of lesion, after which she underwent an MRI of the right clavicle, shoulder, and scapula.  Currently, she reports pain in her upper chest and back, exacerbated by deep breaths. The pain in her right shoulder affects her range of motion and daily activities, with pain levels reaching 9  to 10 out of 10. She is taking 800 mg of ibuprofen and Norco 5/325mg  and pain is partially controlled  She also experiences significant pain in her left hip, which was part of the initial discomfort. The pain affects her mobility, and she initially thought it was due to sleeping wrong.   No unintentional weight loss, excessive sweating, or fever, attributing some sweating to menopause. Her weight has been stable, and she maintains a good appetite. She reports regular bowel movements, though slightly sluggish due to pain medication.  Her family history includes a sister who died of Hodgkin's disease at age 64.   MEDICAL HISTORY:  Past Medical History:  Diagnosis Date   Anxiety    Diabetes mellitus without complication (HCC)    Hypertension     SURGICAL HISTORY: Past Surgical History:  Procedure Laterality Date   CESAREAN SECTION     CHOLECYSTECTOMY     TUBAL LIGATION      SOCIAL HISTORY: Social History   Socioeconomic History   Marital status: Married    Spouse name: Not on file   Number of children: Not on file   Years of education: Not on file   Highest education level: Associate degree: academic program  Occupational History   Not on file  Tobacco Use   Smoking status: Never   Smokeless tobacco: Never  Substance and Sexual Activity   Alcohol use: No   Drug use: Never   Sexual activity: Not Currently    Birth control/protection: None  Other Topics Concern   Not on file  Social History Narrative   Not on file   Social Drivers of Health   Financial Resource Strain: Low Risk  (08/29/2023)   Overall Financial Resource Strain (CARDIA)    Difficulty of Paying Living Expenses: Not hard at all  Food Insecurity: No Food Insecurity (12/09/2023)   Hunger Vital Sign    Worried About Running Out of Food in the Last Year: Never true    Ran Out of Food in the Last Year: Never true  Transportation Needs: No Transportation Needs (12/09/2023)   PRAPARE - Doctor, general practice (Medical): No    Lack of Transportation (Non-Medical): No  Physical Activity: Sufficiently Active (10/03/2023)   Exercise Vital Sign    Days of Exercise per Week: 6 days    Minutes of Exercise per Session: 30 min  Recent Concern: Physical Activity - Inactive (08/29/2023)  Exercise Vital Sign    Days of Exercise per Week: 0 days    Minutes of Exercise per Session: 30 min  Stress: Stress Concern Present (08/29/2023)   [employer] of Occupational Health - Occupational Stress Questionnaire    Feeling of Stress : To some extent  Social Connections: Moderately Isolated (08/29/2023)   Social Connection and Isolation Panel    Frequency of Communication with Friends and Family: More than three times a week    Frequency of Social Gatherings with Friends and Family: Once a week    Attends Religious Services: Never    Database administrator or Organizations: No    Attends Engineer, structural: Not on file    Marital Status: Married  Catering manager Violence: Not At Risk (12/09/2023)   Humiliation, Afraid, Rape, and Kick questionnaire    Fear of Current or Ex-Partner: No    Emotionally Abused: No    Physically Abused: No    Sexually Abused: No    FAMILY HISTORY: Family History  Problem Relation Age of Onset   COPD Mother    Multiple sclerosis Mother    Varicose Veins Mother    Hodgkin's lymphoma Sister    Cancer Sister    Breast cancer Neg Hx     ALLERGIES:  has no known allergies.  MEDICATIONS:  Current Outpatient Medications  Medication Sig Dispense Refill   albuterol  (VENTOLIN  HFA) 108 (90 Base) MCG/ACT inhaler Inhale 2 puffs into the lungs every 6 (six) hours as needed for wheezing or shortness of breath. 8 g 2   ALPRAZolam  (XANAX ) 0.5 MG tablet Take 0.5-1 tablets (0.25-0.5 mg total) by mouth 2 (two) times daily as needed for anxiety. 15 tablet 0   aspirin 81 MG tablet Take by mouth.     Blood Glucose Monitoring Suppl (ONE TOUCH ULTRA 2) w/Device  KIT 1 each by Does not apply route daily at 2 PM. 1 kit 0   glipiZIDE  (GLUCOTROL  XL) 5 MG 24 hr tablet TAKE 1 TABLET(5 MG) BY MOUTH DAILY WITH BREAKFAST 90 tablet 1   glucose blood (ONETOUCH ULTRA) test strip 1 each by Other route daily. Use as instructed 100 each 12   hydrochlorothiazide  (MICROZIDE ) 12.5 MG capsule TAKE 1 CAPSULE(12.5 MG) BY MOUTH DAILY 90 capsule 0   HYDROcodone -acetaminophen  (NORCO/VICODIN) 5-325 MG tablet Take One tab PO Q 4 hours PRN pain     ibuprofen (ADVIL,MOTRIN) 200 MG tablet      latanoprost (XALATAN) 0.005 % ophthalmic solution 1 drop at bedtime.     lisinopril  (ZESTRIL ) 20 MG tablet TAKE 1 TABLET(20 MG) BY MOUTH DAILY 90 tablet 3   metFORMIN  (GLUCOPHAGE ) 1000 MG tablet Take 1 tablet (1,000 mg total) by mouth 2 (two) times daily with a meal. 180 tablet 3   Multiple Vitamin tablet      Omega-3 Fatty Acids (FISH OIL) 1200 MG CPDR Take by mouth daily.     simvastatin  (ZOCOR ) 10 MG tablet TAKE 1 TABLET(10 MG) BY MOUTH DAILY AT 6 PM 90 tablet 2   methylPREDNISolone  (MEDROL  DOSEPAK) 4 MG TBPK tablet Take 6 pills on day 1, 5 pills on day 2, 4 pills on day 3, 3 pills on day 4, 2 pills on day 5, 1 pill on day 6 (Patient not taking: Reported on 12/09/2023) 1 each 0   triamcinolone cream (KENALOG) 0.1 % Apply topically 2 (two) times daily. (Patient not taking: Reported on 12/09/2023)     No current facility-administered medications for this visit.  Review of Systems  Constitutional:  Positive for fatigue. Negative for appetite change, chills and fever.  HENT:   Negative for hearing loss and voice change.   Eyes:  Negative for eye problems.  Respiratory:  Negative for chest tightness and cough.   Cardiovascular:  Negative for chest pain.  Gastrointestinal:  Negative for abdominal distention, abdominal pain and blood in stool.  Endocrine: Negative for hot flashes.  Genitourinary:  Negative for difficulty urinating and frequency.   Musculoskeletal:  Positive for arthralgias.        Shoulder, upper chest, hip pain  Skin:  Negative for itching and rash.  Neurological:  Negative for extremity weakness.  Hematological:  Negative for adenopathy.  Psychiatric/Behavioral:  Negative for confusion.      PHYSICAL EXAMINATION: ECOG PERFORMANCE STATUS: 1 - Symptomatic but completely ambulatory  Vitals:   12/09/23 1515  BP: 126/69  Pulse: 97  Resp: 18  Temp: 98.6 F (37 C)  SpO2: 97%   Filed Weights   12/09/23 1515  Weight: 189 lb 12.8 oz (86.1 kg)    Physical Exam Constitutional:      General: She is not in acute distress.    Appearance: She is not diaphoretic.  HENT:     Head: Normocephalic and atraumatic.     Nose: Nose normal.  Eyes:     General: No scleral icterus. Cardiovascular:     Rate and Rhythm: Normal rate and regular rhythm.  Pulmonary:     Effort: Pulmonary effort is normal. No respiratory distress.     Breath sounds: Normal breath sounds. No wheezing.  Abdominal:     General: Bowel sounds are normal. There is no distension.     Palpations: Abdomen is soft.  Musculoskeletal:        General: Normal range of motion.     Cervical back: Normal range of motion and neck supple.  Skin:    General: Skin is warm and dry.     Findings: No erythema.  Neurological:     Mental Status: She is alert and oriented to person, place, and time. Mental status is at baseline.     Motor: No abnormal muscle tone.  Psychiatric:        Mood and Affect: Mood and affect normal.      LABORATORY DATA:  I have reviewed the data as listed    Latest Ref Rng & Units 12/09/2023    3:54 PM 08/30/2023    2:27 PM 02/29/2020   10:43 AM  CBC  WBC 4.0 - 10.5 K/uL 6.4  7.1  7.9   Hemoglobin 12.0 - 15.0 g/dL 9.3  87.8  86.1   Hematocrit 36.0 - 46.0 % 29.0  36.4  41.3   Platelets 150 - 400 K/uL 152  189  185       Latest Ref Rng & Units 12/09/2023    3:54 PM 08/30/2023    2:27 PM 04/15/2023    9:18 AM  CMP  Glucose 70 - 99 mg/dL 880  92  76   BUN 8 - 23  mg/dL 24  17  13    Creatinine 0.44 - 1.00 mg/dL 9.16  9.32  9.30   Sodium 135 - 145 mmol/L 138  142  138   Potassium 3.5 - 5.1 mmol/L 4.4  4.2  4.2   Chloride 98 - 111 mmol/L 99  99  98   CO2 22 - 32 mmol/L 26  23  24    Calcium 8.9 - 10.3 mg/dL  10.1  9.9  9.5   Total Protein 6.5 - 8.1 g/dL 9.8  8.1  7.6   Total Bilirubin 0.0 - 1.2 mg/dL 0.5  0.5  0.5   Alkaline Phos 38 - 126 U/L 41  60  59   AST 15 - 41 U/L 24  21  16    ALT 0 - 44 U/L 15  13  16       RADIOGRAPHIC STUDIES: I have personally reviewed the radiological images as listed and agreed with the findings in the report. No results found.

## 2023-12-09 NOTE — Assessment & Plan Note (Signed)
 Pending above work up

## 2023-12-09 NOTE — Assessment & Plan Note (Signed)
 Check cbc cmp multiple myeloma panel, light chain ratio, beta 2 microglobulin, LDH.

## 2023-12-09 NOTE — Telephone Encounter (Signed)
 Called pt to schedule CT - left vm

## 2023-12-09 NOTE — Assessment & Plan Note (Signed)
 Recommend patient to continue Norco 5/325mg  Q4-6hour PRN.

## 2023-12-10 ENCOUNTER — Ambulatory Visit: Payer: Self-pay | Admitting: Oncology

## 2023-12-10 ENCOUNTER — Encounter: Payer: Self-pay | Admitting: Oncology

## 2023-12-10 DIAGNOSIS — D539 Nutritional anemia, unspecified: Secondary | ICD-10-CM

## 2023-12-10 DIAGNOSIS — C7951 Secondary malignant neoplasm of bone: Secondary | ICD-10-CM

## 2023-12-10 DIAGNOSIS — M898X9 Other specified disorders of bone, unspecified site: Secondary | ICD-10-CM

## 2023-12-10 LAB — KAPPA/LAMBDA LIGHT CHAINS
Kappa free light chain: 1104.9 mg/L — ABNORMAL HIGH (ref 3.3–19.4)
Kappa, lambda light chain ratio: 298.62 — ABNORMAL HIGH (ref 0.26–1.65)
Lambda free light chains: 3.7 mg/L — ABNORMAL LOW (ref 5.7–26.3)

## 2023-12-10 LAB — BETA 2 MICROGLOBULIN, SERUM: Beta-2 Microglobulin: 3.9 mg/L — ABNORMAL HIGH (ref 0.6–2.4)

## 2023-12-11 ENCOUNTER — Telehealth: Payer: Self-pay

## 2023-12-11 LAB — MULTIPLE MYELOMA PANEL, SERUM
Albumin SerPl Elph-Mcnc: 3.4 g/dL (ref 2.9–4.4)
Albumin/Glob SerPl: 0.6 — ABNORMAL LOW (ref 0.7–1.7)
Alpha 1: 0.2 g/dL (ref 0.0–0.4)
Alpha2 Glob SerPl Elph-Mcnc: 0.6 g/dL (ref 0.4–1.0)
B-Globulin SerPl Elph-Mcnc: 4.8 g/dL — ABNORMAL HIGH (ref 0.7–1.3)
Gamma Glob SerPl Elph-Mcnc: 0.4 g/dL (ref 0.4–1.8)
Globulin, Total: 6 g/dL — ABNORMAL HIGH (ref 2.2–3.9)
IgA: 3964 mg/dL — ABNORMAL HIGH (ref 87–352)
IgG (Immunoglobin G), Serum: 309 mg/dL — ABNORMAL LOW (ref 586–1602)
IgM (Immunoglobulin M), Srm: 11 mg/dL — ABNORMAL LOW (ref 26–217)
M Protein SerPl Elph-Mcnc: 4.1 g/dL — ABNORMAL HIGH
Total Protein ELP: 9.4 g/dL — ABNORMAL HIGH (ref 6.0–8.5)

## 2023-12-11 NOTE — Telephone Encounter (Signed)
 Please schedule Palliative visit to discuss pain.  Pt is aware of need for visit and prefers appt tomorrow.

## 2023-12-11 NOTE — Progress Notes (Signed)
 Patient is scheduled for bone marrow biopsy on Wed 12/18/23 at 8:30 am and to arrive at 7:30 am, pt is aware.

## 2023-12-11 NOTE — Telephone Encounter (Signed)
 Patient notified.  Please cancel CT and schedule PET as MD advise.  I can inform her of appt details when I call her regarding bone marrow biopsy appt.

## 2023-12-11 NOTE — Telephone Encounter (Signed)
 Patient's pain is not under control with the use of Hydrocodone  5/325 with an additional Tylenol  Q4H.  Describes pain as diffuse that is worse in shoulders and  hips.  Having difficulty walking which is now a shuffle with her husbands assistance.    Constant pain 7/10.

## 2023-12-12 ENCOUNTER — Telehealth: Payer: Self-pay | Admitting: *Deleted

## 2023-12-12 ENCOUNTER — Telehealth: Payer: Self-pay

## 2023-12-12 ENCOUNTER — Inpatient Hospital Stay (HOSPITAL_BASED_OUTPATIENT_CLINIC_OR_DEPARTMENT_OTHER): Admitting: Hospice and Palliative Medicine

## 2023-12-12 ENCOUNTER — Encounter: Payer: Self-pay | Admitting: Hospice and Palliative Medicine

## 2023-12-12 DIAGNOSIS — G893 Neoplasm related pain (acute) (chronic): Secondary | ICD-10-CM

## 2023-12-12 DIAGNOSIS — Z79899 Other long term (current) drug therapy: Secondary | ICD-10-CM | POA: Diagnosis not present

## 2023-12-12 DIAGNOSIS — C7951 Secondary malignant neoplasm of bone: Secondary | ICD-10-CM

## 2023-12-12 DIAGNOSIS — D539 Nutritional anemia, unspecified: Secondary | ICD-10-CM | POA: Diagnosis not present

## 2023-12-12 DIAGNOSIS — M898X9 Other specified disorders of bone, unspecified site: Secondary | ICD-10-CM

## 2023-12-12 DIAGNOSIS — Z807 Family history of other malignant neoplasms of lymphoid, hematopoietic and related tissues: Secondary | ICD-10-CM | POA: Diagnosis not present

## 2023-12-12 DIAGNOSIS — F32A Depression, unspecified: Secondary | ICD-10-CM

## 2023-12-12 DIAGNOSIS — R5383 Other fatigue: Secondary | ICD-10-CM | POA: Diagnosis not present

## 2023-12-12 DIAGNOSIS — F419 Anxiety disorder, unspecified: Secondary | ICD-10-CM

## 2023-12-12 MED ORDER — ALPRAZOLAM 0.5 MG PO TABS: 0.2500 mg | ORAL_TABLET | Freq: Two times a day (BID) | ORAL | 0 refills | Status: DC | PRN

## 2023-12-12 MED ORDER — HYDROCODONE-ACETAMINOPHEN 5-325 MG PO TABS: 1.0000 | ORAL_TABLET | ORAL | 0 refills | Status: DC | PRN

## 2023-12-12 MED ORDER — ESCITALOPRAM OXALATE 10 MG PO TABS: 10.0000 mg | ORAL_TABLET | Freq: Every day | ORAL | 0 refills | Status: DC

## 2023-12-12 MED ORDER — NALOXONE HCL 4 MG/0.1ML NA LIQD: NASAL | 0 refills | Status: AC

## 2023-12-12 MED ORDER — MORPHINE SULFATE ER 15 MG PO TBCR: 15.0000 mg | EXTENDED_RELEASE_TABLET | Freq: Two times a day (BID) | ORAL | 0 refills | Status: DC

## 2023-12-12 NOTE — Telephone Encounter (Signed)
 Amber from [employer] has got approval for morphine  15 mg starting today 12/12/2023 and a go through 11/2024.  If you need to talk to them it is [phone] option 5

## 2023-12-12 NOTE — Progress Notes (Signed)
 Pt tearful reports she is unable to walk well.  In the past 3 weeks mobility has declined from walking to a shuffle.  Pt reports severe pain in right shoulder, lower back and hips. As well as in hips bilaterally. Hydrocodone  is not controlling pain.  Dr babara instructed pt not to use Ibuprofen.  PCP had prescribed prednisone, pt completed.

## 2023-12-12 NOTE — Telephone Encounter (Signed)
 PA for Morphine  Sulfate ER 15MG  er tablets has been initiated and submitted via cover my meds.    PA Case ID #: [id number] Rx #: [id number]

## 2023-12-12 NOTE — Progress Notes (Signed)
 Palliative Medicine [facility] at [facility] [phone] [phone]   Name: Dana Wallace Date: 12/12/2023 MRN: [id number]  DOB: [birth year]  Patient Care Team: Myrla Jon HERO, MD as PCP - General (Family Medicine) Babara Call, MD as Consulting Physician (Oncology)    REASON FOR CONSULTATION: Dana Wallace is a 68 y.o. female with multiple medical problems including metastatic bone lesions.  Patient has had ongoing pain.  She was referred to palliative care to address goals and manage ongoing symptoms.  SOCIAL HISTORY:     reports that she has never smoked. She has never used smokeless tobacco. She reports that she does not drink alcohol and does not use drugs.  Patient is married and lives at home with her husband.  She has a son and daughter who live nearby.  Patient's daughter is an Charity fundraiser for [employer] and son works in the lab at [employer].  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  Patient has two schnoodles at home.   ADVANCE DIRECTIVES:    CODE STATUS:   PAST MEDICAL HISTORY: Past Medical History:  Diagnosis Date   Anxiety    Diabetes mellitus without complication (HCC)    Hypertension     PAST SURGICAL HISTORY:  Past Surgical History:  Procedure Laterality Date   CESAREAN SECTION     CHOLECYSTECTOMY     TUBAL LIGATION      HEMATOLOGY/ONCOLOGY HISTORY:  Oncology History   No history exists.    ALLERGIES:  has no known allergies.  MEDICATIONS:  Current Outpatient Medications  Medication Sig Dispense Refill   albuterol  (VENTOLIN  HFA) 108 (90 Base) MCG/ACT inhaler Inhale 2 puffs into the lungs every 6 (six) hours as needed for wheezing or shortness of breath. 8 g 2   ALPRAZolam  (XANAX ) 0.5 MG tablet Take 0.5-1 tablets (0.25-0.5 mg total) by mouth 2 (two) times daily as needed for anxiety. 15 tablet 0   aspirin 81 MG tablet Take by mouth.      Blood Glucose Monitoring Suppl (ONE TOUCH ULTRA 2) w/Device KIT 1 each by Does not apply route daily at 2 PM. 1 kit 0   glipiZIDE  (GLUCOTROL  XL) 5 MG 24 hr tablet TAKE 1 TABLET(5 MG) BY MOUTH DAILY WITH BREAKFAST 90 tablet 1   glucose blood (ONETOUCH ULTRA) test strip 1 each by Other route daily. Use as instructed 100 each 12   hydrochlorothiazide  (MICROZIDE ) 12.5 MG capsule TAKE 1 CAPSULE(12.5 MG) BY MOUTH DAILY 90 capsule 0   HYDROcodone -acetaminophen  (NORCO/VICODIN) 5-325 MG tablet Take One tab PO Q 4 hours PRN pain     ibuprofen (ADVIL,MOTRIN) 200 MG tablet      latanoprost (XALATAN) 0.005 % ophthalmic solution 1 drop at bedtime.     lisinopril  (ZESTRIL ) 20 MG tablet TAKE 1 TABLET(20 MG) BY MOUTH DAILY 90 tablet 3   metFORMIN  (GLUCOPHAGE ) 1000 MG tablet Take 1 tablet (1,000 mg total) by mouth 2 (two) times daily with a meal. 180 tablet 3   methylPREDNISolone  (MEDROL  DOSEPAK) 4 MG TBPK tablet Take 6 pills on day 1, 5 pills on day 2, 4 pills on day 3, 3 pills on day 4, 2 pills on day 5, 1 pill on day 6 (Patient not taking: Reported on 12/09/2023) 1 each 0   Multiple Vitamin tablet      Omega-3 Fatty Acids (FISH OIL) 1200 MG CPDR Take by mouth daily.  simvastatin  (ZOCOR ) 10 MG tablet TAKE 1 TABLET(10 MG) BY MOUTH DAILY AT 6 PM 90 tablet 2   triamcinolone cream (KENALOG) 0.1 % Apply topically 2 (two) times daily. (Patient not taking: Reported on 12/09/2023)     No current facility-administered medications for this visit.    VITAL SIGNS: There were no vitals taken for this visit. There were no vitals filed for this visit.  Estimated body mass index is 35.86 kg/m as calculated from the following:   Height as of 10/03/23: 5' 1 (1.549 m).   Weight as of 12/09/23: 189 lb 12.8 oz (86.1 kg).  LABS: CBC:    Component Value Date/Time   WBC 6.4 12/09/2023 1554   HGB 9.3 (L) 12/09/2023 1554   HGB 12.1 08/30/2023 1427   HCT 29.0 (L) 12/09/2023 1554   HCT 36.4 08/30/2023 1427   PLT 152  12/09/2023 1554   PLT 189 08/30/2023 1427   MCV 104.7 (H) 12/09/2023 1554   MCV 98 (H) 08/30/2023 1427   NEUTROABS 4.3 12/09/2023 1554   NEUTROABS 5.0 08/30/2023 1427   LYMPHSABS 1.4 12/09/2023 1554   LYMPHSABS 1.5 08/30/2023 1427   MONOABS 0.6 12/09/2023 1554   EOSABS 0.1 12/09/2023 1554   EOSABS 0.1 08/30/2023 1427   BASOSABS 0.0 12/09/2023 1554   BASOSABS 0.0 08/30/2023 1427   Comprehensive Metabolic Panel:    Component Value Date/Time   NA 138 12/09/2023 1554   NA 142 08/30/2023 1427   K 4.4 12/09/2023 1554   CL 99 12/09/2023 1554   CO2 26 12/09/2023 1554   BUN 24 (H) 12/09/2023 1554   BUN 17 08/30/2023 1427   CREATININE 0.83 12/09/2023 1554   GLUCOSE 119 (H) 12/09/2023 1554   CALCIUM 10.1 12/09/2023 1554   AST 24 12/09/2023 1554   ALT 15 12/09/2023 1554   ALKPHOS 41 12/09/2023 1554   BILITOT 0.5 12/09/2023 1554   BILITOT 0.5 08/30/2023 1427   PROT 9.8 (H) 12/09/2023 1554   PROT 8.1 08/30/2023 1427   ALBUMIN 3.2 (L) 12/09/2023 1554   ALBUMIN 4.2 08/30/2023 1427    RADIOGRAPHIC STUDIES: No results found.  PERFORMANCE STATUS (ECOG) : 1 - Symptomatic but completely ambulatory  Review of Systems Unless otherwise noted, a complete review of systems is negative.  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  She is pending workup including PET scan.  Multiple myeloma panel showed elevated IgA and kappa free light chain of 1,104 and kappa lambda light chain of 298.  Met with patient and husband today in clinic.  Introduced palliative care.  Patient tearful as she described worsening pain over the past several weeks.  She was highly functional, walking 40 minutes daily, and  is now having pain to the extent that it is limiting her activity and negatively impacting her quality of life and sleep.  Patient was initially evaluated by Ortho who found bone lesions on imaging.  Patient states that she was told that she has a pathologic fracture to her right clavicle.  This is resulted in limited range of motion to her right upper extremity.  Patient was provided with a sling by Ortho but it is not made significant difference.  Patient has been taking 1 Norco 5-325 mg every 4 hours around-the-clock.  She denies grogginess  or loopiness.  She says that taking a Norco will generally improve pain from 10 out of 10 to 7 out of 10 the patient reports that pain is generally still poorly controlled.  Patient thinks that pain at 5 out of 10 would be a tolerable level.  Recommended that we consider starting a long-acting opioid.  Discussed options of MS Contin  versus transdermal fentanyl.  Patient would prefer MS Contin .  Will also liberalize Norco 1 to 2 tablets every 4 hours as needed for breakthrough pain.  Recommended that patient start daily bowel regimen with MiraLAX/senna to prevent opioid-induced constipation.  Patient tearful and endorses depressive symptoms/anxiety.  She does not have history of depression.  She has been taking alprazolam  as needed, particularly at bedtime to help her sleep.  She request refill of alprazolam  today.  Patient also in agreement with starting SSRI.  Patient recognizes that workup appears consistent with multiple myeloma.  She is pending PET scan tomorrow and then will receive bone marrow biopsy to confirm diagnosis.  Patient will then follow-up with Dr. Babara to discuss treatment options.  Patient verbalizes agreement with this plan.  PLAN: - Continue plan for workup - Start MS Contin  15 mg every 12 hours #30 - Liberalize Norco 1 to 2 tablets every 4 hours as needed for breakthrough pain #60 - Naloxone  kit - Daily bowel regimen with MiraLAX/senna -  Refill alprazolam  #30 - Start Lexapro  10 mg daily - Referral to social work - PDMP reviewed - Follow-up telephone visit tomorrow  Case and plan discussed with Dr. Babara   Patient expressed understanding and was in agreement with this plan. She also understands that She can call the clinic at any time with any questions, concerns, or complaints.     Time Total: 30 minutes  Visit consisted of counseling and education dealing with the complex and emotionally intense issues of symptom management and palliative care in the setting of serious and potentially life-threatening illness.Greater than 50%  of this time was spent counseling and coordinating care related to the above assessment and plan.  Signed by: Fonda Mower, PhD, NP-C

## 2023-12-13 ENCOUNTER — Ambulatory Visit: Admitting: Hospice and Palliative Medicine

## 2023-12-13 ENCOUNTER — Ambulatory Visit
Admission: RE | Admit: 2023-12-13 | Discharge: 2023-12-13 | Disposition: A | Discharge status: Home / Self Care | Source: Ambulatory Visit | Attending: Oncology | Admitting: Oncology

## 2023-12-13 DIAGNOSIS — C7951 Secondary malignant neoplasm of bone: Secondary | ICD-10-CM | POA: Diagnosis not present

## 2023-12-13 DIAGNOSIS — R937 Abnormal findings on diagnostic imaging of other parts of musculoskeletal system: Secondary | ICD-10-CM | POA: Diagnosis not present

## 2023-12-13 DIAGNOSIS — Z515 Encounter for palliative care: Secondary | ICD-10-CM

## 2023-12-13 DIAGNOSIS — G893 Neoplasm related pain (acute) (chronic): Secondary | ICD-10-CM

## 2023-12-13 DIAGNOSIS — M899 Disorder of bone, unspecified: Secondary | ICD-10-CM | POA: Insufficient documentation

## 2023-12-13 DIAGNOSIS — M898X9 Other specified disorders of bone, unspecified site: Secondary | ICD-10-CM

## 2023-12-13 DIAGNOSIS — M898X8 Other specified disorders of bone, other site: Secondary | ICD-10-CM | POA: Diagnosis not present

## 2023-12-13 LAB — GLUCOSE, CAPILLARY: Glucose-Capillary: 67 mg/dL — ABNORMAL LOW (ref 70–99)

## 2023-12-13 MED ORDER — FLUDEOXYGLUCOSE F - 18 (FDG) INJECTION
9.9300 | Freq: Once | INTRAVENOUS | Status: AC | PRN
  Administered 2023-12-13: 9.93 via INTRAVENOUS

## 2023-12-13 NOTE — Progress Notes (Signed)
 Virtual Visit via Telephone Note  I connected with Dana Wallace on 12/13/23 at 10:45 AM EDT by telephone and verified that I am speaking with the correct person using two identifiers.  Location: Patient: Home Provider: Clinic   I discussed the limitations, risks, security and privacy concerns of performing an evaluation and management service by telephone and the availability of in person appointments. I also discussed with the patient that there may be a patient responsible charge related to this service. The patient expressed understanding and agreed to proceed.   History of Present Illness: Dana Wallace is a 68 y.o. female with multiple medical problems including metastatic bone lesions.  Patient has had ongoing pain.  She was referred to palliative care to address goals and manage ongoing symptoms.  Observations/Objective: I called and spoke with patient by phone.  She has taken 2 doses of MS Contin .  Patient states that she has intermittently required half a tablet of Norco and that combination has brought her pain to 5 out of 10, which she finds tolerable.  Denies any adverse effects from pain medications.  No new symptomatic complaints or concerns.  Patient is pending PET scan later today and then biopsy next week.  Assessment and Plan: Metastatic bone lesions -concerning for multiple myeloma.  Patient is pending PET scan and biopsy.  Neoplasm related pain -continue regimen of MS Contin /Norco.  Continue daily bowel regimen.  Follow Up Instructions: RTC 2 weeks   I discussed the assessment and treatment plan with the patient. The patient was provided an opportunity to ask questions and all were answered. The patient agreed with the plan and demonstrated an understanding of the instructions.   The patient was advised to call back or seek an in-person evaluation if the symptoms worsen or if the condition fails to improve as anticipated.  I provided 10 minutes of  non-face-to-face time during this encounter.   FONDA JONELLE MOWER, NP

## 2023-12-17 ENCOUNTER — Other Ambulatory Visit: Payer: Self-pay | Admitting: Radiology

## 2023-12-17 ENCOUNTER — Ambulatory Visit

## 2023-12-17 ENCOUNTER — Telehealth: Payer: Self-pay | Admitting: *Deleted

## 2023-12-17 DIAGNOSIS — D539 Nutritional anemia, unspecified: Secondary | ICD-10-CM

## 2023-12-17 NOTE — H&P (Signed)
 Chief Complaint: Metastatic bone lesions; request for image guided bone marrow biopsy  Referring Provider(s): Yu,Zhou   Supervising Physician: Vanice Revel  Patient Status: [facility] - Out-pt  History of Present Illness: Dana Wallace is a 68 y.o. female with PMH significant for DM, HTN. She recently began having R shoulder pain and was found to have pathologic fx involving R clavicle. Further investigation with PET 12/13/23 obtained by Dr. Babara revealed diffuse and extensive lytic bone lesions throughout the axial and appendicular skeleton. The PET also showed soft tissue masses associated with the left second anterior rib and the lower sternum.  IR consulted for bone marrow biopsy to confirm suspected multiple myeloma diagnosis.   Confirms NPO since prior to MN and ride/supervision available for 24 hours.  Does not wear CPAP or use supplemental home O2.  Denies fever, chills, SOB, sore throat, N/V, abd pain, blood in stool or urine, abnormal bruising. She endorses severe pain, especially R shoulder and sternum from lytic lesions.   Allergies Reviewed:  Patient has no known allergies.   Patient is Full Code  Past Medical History:  Diagnosis Date   Anxiety    Diabetes mellitus without complication (HCC)    Hypertension     Past Surgical History:  Procedure Laterality Date   CESAREAN SECTION     CHOLECYSTECTOMY     TUBAL LIGATION        Medications: Prior to Admission medications   Medication Sig Start Date End Date Taking? Authorizing Provider  albuterol  (VENTOLIN  HFA) 108 (90 Base) MCG/ACT inhaler Inhale 2 puffs into the lungs every 6 (six) hours as needed for wheezing or shortness of breath. 08/30/23   Ostwalt, Janna, PA-C  ALPRAZolam  (XANAX ) 0.5 MG tablet Take 0.5-1 tablets (0.25-0.5 mg total) by mouth 2 (two) times daily as needed for anxiety. 12/12/23   Borders, Fonda SAUNDERS, NP  aspirin 81 MG tablet Take by mouth. 03/02/11   [provider]  Blood Glucose  Monitoring Suppl (ONE TOUCH ULTRA 2) w/Device KIT 1 each by Does not apply route daily at 2 PM. 02/11/23   Bacigalupo, Jon HERO, MD  escitalopram  (LEXAPRO ) 10 MG tablet Take 1 tablet (10 mg total) by mouth daily. 12/12/23   Borders, Fonda SAUNDERS, NP  glipiZIDE  (GLUCOTROL  XL) 5 MG 24 hr tablet TAKE 1 TABLET(5 MG) BY MOUTH DAILY WITH BREAKFAST 09/23/23   Bacigalupo, Angela M, MD  glucose blood (ONETOUCH ULTRA) test strip 1 each by Other route daily. Use as instructed 02/11/23   Myrla Jon HERO, MD  hydrochlorothiazide  (MICROZIDE ) 12.5 MG capsule TAKE 1 CAPSULE(12.5 MG) BY MOUTH DAILY 11/29/23   Bacigalupo, Jon HERO, MD  HYDROcodone -acetaminophen  (NORCO/VICODIN) 5-325 MG tablet Take 1-2 tablets by mouth every 4 (four) hours as needed for moderate pain (pain score 4-6). 12/12/23   Borders, Joshua R, NP  latanoprost (XALATAN) 0.005 % ophthalmic solution 1 drop at bedtime. 09/18/23   [provider]  lisinopril  (ZESTRIL ) 20 MG tablet TAKE 1 TABLET(20 MG) BY MOUTH DAILY 09/23/23   Bacigalupo, Jon HERO, MD  metFORMIN  (GLUCOPHAGE ) 1000 MG tablet Take 1 tablet (1,000 mg total) by mouth 2 (two) times daily with a meal. 12/24/22   Bacigalupo, Jon HERO, MD  methylPREDNISolone  (MEDROL  DOSEPAK) 4 MG TBPK tablet Take 6 pills on day 1, 5 pills on day 2, 4 pills on day 3, 3 pills on day 4, 2 pills on day 5, 1 pill on day 6 Patient not taking: Reported on 12/09/2023 11/27/23   Donzella Domino  N, DO  morphine  (MS CONTIN ) 15 MG 12 hr tablet Take 1 tablet (15 mg total) by mouth every 12 (twelve) hours. 12/12/23   Borders, Fonda SAUNDERS, NP  Multiple Vitamin tablet  12/16/08   [provider]  naloxone  (NARCAN ) nasal spray 4 mg/0.1 mL SPRAY 1 SPRAY INTO ONE NOSTRIL AS DIRECTED FOR OPIOID OVERDOSE (TURN PERSON ON SIDE AFTER DOSE. IF NO RESPONSE IN 2-3 MINUTES OR PERSON RESPONDS BUT RELAPSES, REPEAT USING A NEW SPRAY DEVICE AND SPRAY INTO THE OTHER NOSTRIL. CALL 911 AFTER USE.) * EMERGENCY USE ONLY * 12/12/23   Borders, Fonda SAUNDERS, NP   Omega-3 Fatty Acids (FISH OIL) 1200 MG CPDR Take by mouth daily.    [provider]  simvastatin  (ZOCOR ) 10 MG tablet TAKE 1 TABLET(10 MG) BY MOUTH DAILY AT 6 PM 09/19/23   Bacigalupo, Jon HERO, MD     Family History  Problem Relation Age of Onset   COPD Mother    Multiple sclerosis Mother    Varicose Veins Mother    Hodgkin's lymphoma Sister    Cancer Sister    Breast cancer Neg Hx     Social History   Socioeconomic History   Marital status: Married    Spouse name: Not on file   Number of children: Not on file   Years of education: Not on file   Highest education level: Associate degree: academic program  Occupational History   Not on file  Tobacco Use   Smoking status: Never   Smokeless tobacco: Never  Substance and Sexual Activity   Alcohol use: No   Drug use: Never   Sexual activity: Not Currently    Birth control/protection: None  Other Topics Concern   Not on file  Social History Narrative   Not on file   Social Drivers of Health   Financial Resource Strain: Low Risk  (08/29/2023)   Overall Financial Resource Strain (CARDIA)    Difficulty of Paying Living Expenses: Not hard at all  Food Insecurity: No Food Insecurity (12/09/2023)   Hunger Vital Sign    Worried About Running Out of Food in the Last Year: Never true    Ran Out of Food in the Last Year: Never true  Transportation Needs: No Transportation Needs (12/09/2023)   PRAPARE - Administrator, Civil Service (Medical): No    Lack of Transportation (Non-Medical): No  Physical Activity: Sufficiently Active (10/03/2023)   Exercise Vital Sign    Days of Exercise per Week: 6 days    Minutes of Exercise per Session: 30 min  Recent Concern: Physical Activity - Inactive (08/29/2023)   Exercise Vital Sign    Days of Exercise per Week: 0 days    Minutes of Exercise per Session: 30 min  Stress: Stress Concern Present (08/29/2023)   [employer] of Occupational Health - Occupational Stress  Questionnaire    Feeling of Stress : To some extent  Social Connections: Moderately Isolated (08/29/2023)   Social Connection and Isolation Panel    Frequency of Communication with Friends and Family: More than three times a week    Frequency of Social Gatherings with Friends and Family: Once a week    Attends Religious Services: Never    Database administrator or Organizations: No    Attends Engineer, structural: Not on file    Marital Status: Married     Review of Systems: A 12 point ROS discussed and pertinent positives are indicated in the HPI above.  All other systems are negative.   Vital Signs: BP (!) 105/59   Pulse 87   Temp 98.2 F (36.8 C) (Oral)   Resp 12   Ht 5' (1.524 m)   Wt 184 lb 11.2 oz (83.8 kg)   SpO2 95%   BMI 36.07 kg/m   Advance Care Plan: No documents on file    Physical Exam HENT:     Mouth/Throat:     Mouth: Mucous membranes are dry.     Pharynx: Oropharynx is clear.  Cardiovascular:     Rate and Rhythm: Normal rate.     Pulses: Normal pulses.     Comments: Frequent PVCs. Regular appearing underlying rhythm Pulmonary:     Effort: Pulmonary effort is normal.     Breath sounds: Normal breath sounds.  Abdominal:     Palpations: Abdomen is soft.     Comments: Abd is non tender but palpation results in referred pain to sternum  Skin:    General: Skin is warm and dry.  Neurological:     Mental Status: She is alert and oriented to person, place, and time.     Sensory: No sensory deficit.  Psychiatric:        Mood and Affect: Mood normal.        Behavior: Behavior normal.        Thought Content: Thought content normal.        Judgment: Judgment normal.     Imaging: NM PET Image Initial (PI) Skull Base To Thigh (F-18 FDG) Result Date: 12/15/2023 CLINICAL DATA:  Initial treatment strategy for lytic bone lesions. EXAM: NUCLEAR MEDICINE PET SKULL BASE TO THIGH TECHNIQUE: 9.93 mCi F-18 FDG was injected intravenously. Full-ring PET  imaging was performed from the skull base to thigh after the radiotracer. CT data was obtained and used for attenuation correction and anatomic localization. Fasting blood glucose: 67 mg/dl COMPARISON:  Outside MRI examination 12/06/2023 FINDINGS: Mediastinal blood pool activity: SUV max 1.8 Liver activity: SUV max NA NECK: No hypermetabolic lymph nodes in the neck. Incidental CT findings: None. CHEST: No hypermetabolic mediastinal or hilar nodes. No suspicious pulmonary nodules on the CT scan. Incidental CT findings: None. ABDOMEN/PELVIS: No abnormal hypermetabolic activity within the liver, pancreas, adrenal glands, or spleen. No hypermetabolic lymph nodes in the abdomen or pelvis. Incidental CT findings: Status post cholecystectomy. No biliary dilatation. Minimal scattered aortic calcifications. No aneurysm. Simple left renal cyst not requiring any further imaging evaluation or follow-up. Small amount of free pelvic fluid noted. SKELETON: Diffuse and extensive lytic bone lesions throughout the axial and appendicular skeleton most likely reflecting multiple myeloma as there are no findings for a primary neoplastic process involving the neck, chest, abdomen pelvis. Pathologic fracture involving the right clavicle. The clavicle as a mod the in appearance with SUV max of 6.9. The second left anterior rib is completely destroyed and has an associated large soft tissue mass. SUV max is 5.6. Associated small adjacent axillary lymph node has an SUV max of 4.1. The lower sternum is completely destroyed with associated soft tissue mass. SUV max is 6.2. Linear band of hypermetabolism in the right gluteus medius muscle is likely traumatic or inflammatory. There is also a focus of hypermetabolism in the left gluteus maximus muscle which is indeterminate. Incidental CT findings: Numerous skeletal lytic lesions but no soft tissue mass causing spinal canal compromise. There are several calcified disc protrusions noted in the  thoracic and spines. IMPRESSION: 1. Diffuse and extensive lytic bone lesions throughout the  axial and appendicular skeleton most likely reflecting multiple myeloma. 2. Pathologic fracture involving the right clavicle. 3. Large soft tissue masses associated with the left second anterior rib and the lower sternum. 4. No findings for a primary neoplastic process involving the neck, chest, abdomen or pelvis. Electronically Signed   By: MYRTIS Stammer M.D.   On: 12/15/2023 15:44    Labs:  CBC: Recent Labs    08/30/23 1427 12/09/23 1554  WBC 7.1 6.4  HGB 12.1 9.3*  HCT 36.4 29.0*  PLT 189 152    COAGS: No results for input(s): INR, APTT in the last 8760 hours.  BMP: Recent Labs    04/15/23 0918 08/30/23 1427 12/09/23 1554  NA 138 142 138  K 4.2 4.2 4.4  CL 98 99 99  CO2 24 23 26   GLUCOSE 76 92 119*  BUN 13 17 24*  CALCIUM 9.5 9.9 10.1  CREATININE 0.69 0.67 0.83  GFRNONAA  --   --  >60    LIVER FUNCTION TESTS: Recent Labs    04/15/23 0918 08/30/23 1427 12/09/23 1554  BILITOT 0.5 0.5 0.5  AST 16 21 24   ALT 16 13 15   ALKPHOS 59 60 41  PROT 7.6 8.1 9.8*  ALBUMIN 3.9 4.2 3.2*    TUMOR MARKERS: No results for input(s): AFPTM, CEA, CA199, CHROMGRNA in the last 8760 hours.  Assessment and Plan:  Request for  image guided bone marrow biopsy and aspiration approved for 7/30 with Dr. Vanice. Patient is anxious about pain with positioning prior to procedure start. She will be given 1mg  PO versed  prior to repositioning for procedure.  No contraindications for procedure identified in ROS, physical exam, or review of pre-sedation considerations. CBC w/ diff to be obtained day of procedure, in process. 7/25 PET imaging available and reviewed. No primary neoplastic process involving the pelvis identified on PET.  VSS, afebrile Patient not asked to hold any AC/AP for this low bleeding risk procedure Abx not indicated    Risks and benefits of image guided bone marrow  biopsy was discussed with the patient and/or patient's family including, but not limited to bleeding, infection, damage to adjacent structures or low yield requiring additional tests.  All of the questions were answered and there is agreement to proceed.  Consent signed and in chart.   Thank you for allowing our service to participate in Dana Wallace 's care.    Electronically Signed: Laymon Coast, NP   12/18/2023, 8:17 AM     I spent a total of  15 Minutes   in face to face in clinical consultation, greater than 50% of which was counseling/coordinating care for image guided bone marrow biopsy.    (A copy of this note was sent to the referring provider and the time of visit.)

## 2023-12-17 NOTE — Progress Notes (Signed)
 Patient for IR Bone Marrow Biopsy on Wed 12/18/23, I called and spoke with the patient on the phone and gave pre-procedure instructions. Pt was made aware to be here at 7:30a, NPO after MN prior to procedure as well as driver post procedure/recovery/discharge. Pt stated understanding.  Called 12/17/23

## 2023-12-17 NOTE — Telephone Encounter (Signed)
 I called her back and told her that she did have to arrive at 7:30 AM , nothing to eat or drink for 7 hours or to the biopsy.  She will need a driver to take her home, she is a diabetic but she only takes oral medicines.  And Dana Wallace she is the person that usually calls the day before which will be today for her and if she has any questions you can also talk to her.  She thinks she is okay right now.

## 2023-12-18 ENCOUNTER — Other Ambulatory Visit: Payer: Self-pay

## 2023-12-18 ENCOUNTER — Encounter: Payer: Self-pay | Admitting: Radiology

## 2023-12-18 ENCOUNTER — Ambulatory Visit
Admission: RE | Admit: 2023-12-18 | Discharge: 2023-12-18 | Disposition: A | Discharge status: Home / Self Care | Source: Ambulatory Visit | Attending: Oncology | Admitting: Oncology

## 2023-12-18 DIAGNOSIS — D539 Nutritional anemia, unspecified: Secondary | ICD-10-CM | POA: Diagnosis not present

## 2023-12-18 DIAGNOSIS — E119 Type 2 diabetes mellitus without complications: Secondary | ICD-10-CM | POA: Diagnosis not present

## 2023-12-18 DIAGNOSIS — Z1379 Encounter for other screening for genetic and chromosomal anomalies: Secondary | ICD-10-CM | POA: Diagnosis not present

## 2023-12-18 DIAGNOSIS — Z7984 Long term (current) use of oral hypoglycemic drugs: Secondary | ICD-10-CM | POA: Insufficient documentation

## 2023-12-18 DIAGNOSIS — I10 Essential (primary) hypertension: Secondary | ICD-10-CM | POA: Insufficient documentation

## 2023-12-18 DIAGNOSIS — C9000 Multiple myeloma not having achieved remission: Secondary | ICD-10-CM | POA: Insufficient documentation

## 2023-12-18 DIAGNOSIS — D696 Thrombocytopenia, unspecified: Secondary | ICD-10-CM | POA: Diagnosis not present

## 2023-12-18 LAB — CBC WITH DIFFERENTIAL/PLATELET
Abs Immature Granulocytes: 0.03 K/uL (ref 0.00–0.07)
Basophils Absolute: 0 K/uL (ref 0.0–0.1)
Basophils Relative: 0 %
Eosinophils Absolute: 0.1 K/uL (ref 0.0–0.5)
Eosinophils Relative: 3 %
HCT: 26.8 % — ABNORMAL LOW (ref 36.0–46.0)
Hemoglobin: 8.6 g/dL — ABNORMAL LOW (ref 12.0–15.0)
Immature Granulocytes: 1 %
Lymphocytes Relative: 27 %
Lymphs Abs: 1.3 K/uL (ref 0.7–4.0)
MCH: 33.3 pg (ref 26.0–34.0)
MCHC: 32.1 g/dL (ref 30.0–36.0)
MCV: 103.9 fL — ABNORMAL HIGH (ref 80.0–100.0)
Monocytes Absolute: 0.5 K/uL (ref 0.1–1.0)
Monocytes Relative: 10 %
Neutro Abs: 2.8 K/uL (ref 1.7–7.7)
Neutrophils Relative %: 59 %
Platelets: 140 K/uL — ABNORMAL LOW (ref 150–400)
RBC: 2.58 MIL/uL — ABNORMAL LOW (ref 3.87–5.11)
RDW: 15.6 % — ABNORMAL HIGH (ref 11.5–15.5)
WBC: 4.7 K/uL (ref 4.0–10.5)
nRBC: 0 % (ref 0.0–0.2)

## 2023-12-18 LAB — GLUCOSE, CAPILLARY: Glucose-Capillary: 94 mg/dL (ref 70–99)

## 2023-12-18 MED ORDER — FENTANYL CITRATE (PF) 100 MCG/2ML IJ SOLN
INTRAMUSCULAR | Status: AC | PRN
  Administered 2023-12-18: 25 ug via INTRAVENOUS
  Administered 2023-12-18: 50 ug via INTRAVENOUS
  Administered 2023-12-18: 25 ug via INTRAVENOUS

## 2023-12-18 MED ORDER — SODIUM CHLORIDE 0.9 % IV SOLN: INTRAVENOUS | Status: DC

## 2023-12-18 MED ORDER — LIDOCAINE 1 % OPTIME INJ - NO CHARGE
10.0000 mL | Freq: Once | INTRAMUSCULAR | Status: AC
  Administered 2023-12-18: 10 mL via INTRADERMAL
  Filled 2023-12-18: qty 10

## 2023-12-18 MED ORDER — MIDAZOLAM HCL 5 MG/5ML IJ SOLN
INTRAMUSCULAR | Status: AC | PRN
  Administered 2023-12-18: 1 mg via INTRAVENOUS

## 2023-12-18 MED ORDER — MIDAZOLAM HCL 2 MG/2ML IJ SOLN
INTRAMUSCULAR | Status: AC | PRN
  Administered 2023-12-18: 1 mg via INTRAVENOUS

## 2023-12-18 MED ORDER — HEPARIN SOD (PORK) LOCK FLUSH 100 UNIT/ML IV SOLN
INTRAVENOUS | Status: AC
  Filled 2023-12-18: qty 5

## 2023-12-18 MED ORDER — MIDAZOLAM HCL 2 MG/2ML IJ SOLN
INTRAMUSCULAR | Status: AC
  Filled 2023-12-18: qty 2

## 2023-12-18 MED ORDER — FENTANYL CITRATE (PF) 100 MCG/2ML IJ SOLN
INTRAMUSCULAR | Status: AC
  Filled 2023-12-18: qty 2

## 2023-12-18 NOTE — Procedures (Signed)
 Interventional Radiology Procedure Note  Procedure: IR FLUORO LEFT ILIAC BM ASP AND CORE    Complications: None  Estimated Blood Loss:  MIN  Findings: 11 G CORE ASP AND CORE    EMERSON FREDERIC SPECKING, MD

## 2023-12-18 NOTE — Progress Notes (Signed)
 Patient clinically stable post IR BMB per Dr Vanice, tolerated well. Received Versed  2 mg along with Fentanyl   100 mcg IV for procedure. Pain level decreased post procedure meds. Report given to Laymon Fish RN post procedure/specials/18

## 2023-12-19 ENCOUNTER — Ambulatory Visit: Admitting: Family Medicine

## 2023-12-20 LAB — SURGICAL PATHOLOGY

## 2023-12-25 ENCOUNTER — Encounter: Payer: Self-pay | Admitting: Family Medicine

## 2023-12-25 ENCOUNTER — Encounter: Admitting: Hospice and Palliative Medicine

## 2023-12-25 ENCOUNTER — Inpatient Hospital Stay: Attending: Oncology | Admitting: Oncology

## 2023-12-25 ENCOUNTER — Encounter: Payer: Self-pay | Admitting: Oncology

## 2023-12-25 VITALS — BP 109/59 | HR 95 | Temp 98.4°F | Resp 18 | Ht 60.0 in | Wt 183.0 lb

## 2023-12-25 DIAGNOSIS — M898X9 Other specified disorders of bone, unspecified site: Secondary | ICD-10-CM

## 2023-12-25 DIAGNOSIS — E1169 Type 2 diabetes mellitus with other specified complication: Secondary | ICD-10-CM

## 2023-12-25 DIAGNOSIS — C9000 Multiple myeloma not having achieved remission: Secondary | ICD-10-CM | POA: Insufficient documentation

## 2023-12-25 DIAGNOSIS — G893 Neoplasm related pain (acute) (chronic): Secondary | ICD-10-CM | POA: Diagnosis not present

## 2023-12-25 DIAGNOSIS — E119 Type 2 diabetes mellitus without complications: Secondary | ICD-10-CM | POA: Insufficient documentation

## 2023-12-25 DIAGNOSIS — D539 Nutritional anemia, unspecified: Secondary | ICD-10-CM | POA: Diagnosis not present

## 2023-12-25 DIAGNOSIS — Z5111 Encounter for antineoplastic chemotherapy: Secondary | ICD-10-CM | POA: Diagnosis not present

## 2023-12-25 DIAGNOSIS — Z807 Family history of other malignant neoplasms of lymphoid, hematopoietic and related tissues: Secondary | ICD-10-CM | POA: Diagnosis not present

## 2023-12-25 DIAGNOSIS — Z5112 Encounter for antineoplastic immunotherapy: Secondary | ICD-10-CM | POA: Diagnosis not present

## 2023-12-25 DIAGNOSIS — C7951 Secondary malignant neoplasm of bone: Secondary | ICD-10-CM | POA: Diagnosis not present

## 2023-12-25 MED ORDER — MORPHINE SULFATE ER 15 MG PO TBCR: 15.0000 mg | EXTENDED_RELEASE_TABLET | Freq: Three times a day (TID) | ORAL | 0 refills | Status: DC

## 2023-12-25 MED ORDER — ACYCLOVIR 400 MG PO TABS: 400.0000 mg | ORAL_TABLET | Freq: Two times a day (BID) | ORAL | 3 refills | Status: DC

## 2023-12-25 MED ORDER — DEXAMETHASONE 4 MG PO TABS: 20.0000 mg | ORAL_TABLET | Freq: Two times a day (BID) | ORAL | 0 refills | Status: DC

## 2023-12-25 MED ORDER — ONDANSETRON HCL 8 MG PO TABS: 8.0000 mg | ORAL_TABLET | Freq: Three times a day (TID) | ORAL | 1 refills | Status: AC | PRN

## 2023-12-25 NOTE — Progress Notes (Signed)
 START ON PATHWAY REGIMEN - Multiple Myeloma and Other Plasma Cell Dyscrasias   Dara[facility]d (Daratumumab SUBQ + Bortezomib SUBQ D1,4,8,11 + Lenalidomide PO D1-14 + Dexamethasone  20 mg IV/PO D1,2,8,9,15,16) q21 Days (Induction Schema):   A cycle is every 21 days:     Lenalidomide      Dexamethasone       Bortezomib      Daratumumab and hyaluronidase-fihj    DaraVRd (Daratumumab SUBQ + Bortezomib SUBQ D1,4,8,11 + Lenalidomide PO D1-14 + Dexamethasone  20 mg IV/PO D1,2,8,9,15,16) q21 Days (Consolidation Schema):   A cycle is every 21 days:     Lenalidomide      Dexamethasone       Bortezomib      Daratumumab and hyaluronidase-fihj   **Always confirm dose/schedule in your pharmacy ordering system**  Patient Characteristics: Multiple Myeloma, Newly Diagnosed, Transplant Eligible, Unknown Risk or Awaiting Test Results Disease Classification: Multiple Myeloma Therapeutic Status: Newly Diagnosed R2-ISS Staging: II Is Patient Eligible for Transplant<= Transplant Eligible Risk Status: Awaiting Test Results Intent of Therapy: Non-Curative / Palliative Intent, Discussed with Patient

## 2023-12-25 NOTE — Assessment & Plan Note (Signed)
 Continue Norco 5/325mg  Q4-6hour PRN.  Recommend to increase MS Contin  15 mg twice daily to every 8 hours.

## 2023-12-25 NOTE — Assessment & Plan Note (Signed)
 Patient is currently on metformin  and glipizide .  Anticipate blood sugar reading glucose level due to steroid use during treatments.  Recommend patient to follow-up closely with primary care provider for management.

## 2023-12-25 NOTE — Progress Notes (Signed)
 Patient needs a refill on her morphine  medication. She is having nose bleeds often that usually lasts about 30 minutes. Should she stop taking her Asprin 81 mg daily?

## 2023-12-25 NOTE — Progress Notes (Signed)
 Hematology/Oncology Progress note [phone] [phone]        REFERRING PROVIDER: Myrla Jon HERO, MD    CHIEF COMPLAINTS/PURPOSE OF CONSULTATION:  Metastatic bone lesions.   ASSESSMENT & PLAN:   Multiple myeloma (HCC) Bone marrow biopsy results, PET scan findings, lab results were reviewed and discussed with patient. Diagnosis of IgA kappa multiple myeloma was reviewed and discussed.  Lab Results  Component Value Date   MPROTEIN 4.1 (H) 12/09/2023   KPAFRELGTCHN 1,104.9 (H) 12/09/2023   LAMBDASER 3.7 (L) 12/09/2023   KAPLAMBRATIO 298.62 (H) 12/09/2023    Cytogenetics and multiple myeloma FISH testing results are pending. Patient is a transplant candidate.  Recommend chemotherapy treatments with Dara RVD.  Rationale potential side effects were reviewed and discussed with patient. Will arrange chemotherapy class.  Check Revlimid insurance coverage. Recommend patient take 1 dose of 20 mg dexamethasone , to bridge to start of chemotherapy.  Discussed about avoiding nephrotoxins.  Encourage oral hydration  Thrombosis prophylaxis, aspirin 81 mg daily. 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  Monitor closely.  Neoplasm related pain Continue Norco 5/325mg  Q4-6hour PRN.  Recommend to increase MS Contin  15 mg twice daily to every 8 hours.   T2DM (type 2 diabetes mellitus) (HCC) Patient is currently on metformin  and glipizide .  Anticipate blood sugar reading glucose level due to steroid use during treatments.  Recommend patient to follow-up closely with primary care provider for management.   Orders Placed This Encounter  Procedures   CBC with Differential (Cancer Center Only)    Standing Status:   Future    Expected Date:   01/03/2024    Expiration Date:   01/02/2025   CMP (Cancer Center only)    Standing Status:   Future    Expected Date:   01/03/2024    Expiration Date:   01/02/2025   Follow-up in 1  to 2 weeks to start treatment. All questions were answered. The patient knows to call the clinic with any problems, questions or concerns.  Zelphia Cap, MD, PhD [facility] Hematology Oncology 12/25/2023    HISTORY OF PRESENTING ILLNESS:  Dana Wallace 68 y.o. female presents to establish care for metastatic bone lesions.   Oncology History  Multiple myeloma (HCC)  12/09/2023 Initial Diagnosis   Multiple myeloma (HCC)  She initially experienced chest discomfort starting in her left shoulder in February, which persisted and led her to seek medical attention in April. Despite a comprehensive workup including a chest x-ray, EKG, echocardiogram, mammogram, and a DEXA scan, all results were normal.  In May, she had two follow-up visits with her primary care physician. The discomfort was primarily located behind her left breast and later moved to her right clavicle and sternum area, becoming very painful and tender to touch. An x-ray at [facility] was performed, and the patient was told there was some type of lesion, after which she underwent an MRI of the right clavicle, shoulder, and scapula. Interval marrow biopsy, lab workup and PET scan. She was found to have stage II IgA kappa multiple myeloma.   12/15/2023 Imaging   PET scan showed  1. Diffuse and extensive lytic bone lesions throughout the axial and appendicular skeleton most likely reflecting multiple myeloma. 2. Pathologic fracture involving the right clavicle. 3. Large soft tissue masses associated with the left second anterior rib and the lower sternum. 4. No findings for a primary neoplastic process involving the neck, chest, abdomen or pelvis   12/18/2023 Bone Marrow  Biopsy   Pulmonary biopsy aspirate, clot, core Showed hypercellular bone marrow, involved by kappa restricted plasma cell neoplasm compromising 30-100%, average 70%, of the cellular marrow.   12/25/2023 Cancer Staging   Staging form: Plasma Cell Myeloma and  Plasma Cell Disorders, AJCC 8th Edition - Clinical stage from 12/25/2023: Beta-2 -microglobulin (mg/L): 3.9, Albumin (g/dL): 3.2, ISS: Stage II, LDH: Normal - Signed by Babara Call, MD on 12/25/2023 Stage prefix: Initial diagnosis Beta 2 microglobulin range (mg/L): 3.5 to 5.49 Albumin range (g/dL): Less than 3.5   1/84/7974 -  Chemotherapy   Patient is on Treatment Plan : MYELOMA NEWLY DIAGNOSED TRANSPLANT CANDIDATE DaraVRd (Daratumumab SQ) q21d x 6 Cycles (Induction/Consolidation)       She has pain of upper chest and back, right shoulder, and the left hip.   Patient currently takes Norco as needed as well as MS Contin  50 mg twice daily.  Pain is partially controlled.  She takes about 5-6 Norco per 24 hours. Accompanied by husband.  MEDICAL HISTORY:  Past Medical History:  Diagnosis Date   Anxiety    Diabetes mellitus without complication (HCC)    Hypertension     SURGICAL HISTORY: Past Surgical History:  Procedure Laterality Date   CESAREAN SECTION     CHOLECYSTECTOMY     IR BONE MARROW BIOPSY & ASPIRATION  12/18/2023   TUBAL LIGATION      SOCIAL HISTORY: Social History   Socioeconomic History   Marital status: Married    Spouse name: Not on file   Number of children: Not on file   Years of education: Not on file   Highest education level: Associate degree: academic program  Occupational History   Not on file  Tobacco Use   Smoking status: Never   Smokeless tobacco: Never  Substance and Sexual Activity   Alcohol use: No   Drug use: Never   Sexual activity: Not Currently    Birth control/protection: None  Other Topics Concern   Not on file  Social History Narrative   Not on file   Social Drivers of Health   Financial Resource Strain: Low Risk  (08/29/2023)   Overall Financial Resource Strain (CARDIA)    Difficulty of Paying Living Expenses: Not hard at all  Food Insecurity: No Food Insecurity (12/09/2023)   Hunger Vital Sign    Worried About Running Out of Food in  the Last Year: Never true    Ran Out of Food in the Last Year: Never true  Transportation Needs: No Transportation Needs (12/09/2023)   PRAPARE - Administrator, Civil Service (Medical): No    Lack of Transportation (Non-Medical): No  Physical Activity: Sufficiently Active (10/03/2023)   Exercise Vital Sign    Days of Exercise per Week: 6 days    Minutes of Exercise per Session: 30 min  Recent Concern: Physical Activity - Inactive (08/29/2023)   Exercise Vital Sign    Days of Exercise per Week: 0 days    Minutes of Exercise per Session: 30 min  Stress: Stress Concern Present (08/29/2023)   [employer] of Occupational Health - Occupational Stress Questionnaire    Feeling of Stress : To some extent  Social Connections: Moderately Isolated (08/29/2023)   Social Connection and Isolation Panel    Frequency of Communication with Friends and Family: More than three times a week    Frequency of Social Gatherings with Friends and Family: Once a week    Attends Religious Services: Never    Production manager of  Clubs or Organizations: No    Attends Banker Meetings: Not on file    Marital Status: Married  Intimate Partner Violence: Not At Risk (12/09/2023)   Humiliation, Afraid, Rape, and Kick questionnaire    Fear of Current or Ex-Partner: No    Emotionally Abused: No    Physically Abused: No    Sexually Abused: No    FAMILY HISTORY: Family History  Problem Relation Age of Onset   COPD Mother    Multiple sclerosis Mother    Varicose Veins Mother    Hodgkin's lymphoma Sister    Cancer Sister    Breast cancer Neg Hx     ALLERGIES:  has no known allergies.  MEDICATIONS:  Current Outpatient Medications  Medication Sig Dispense Refill   albuterol  (VENTOLIN  HFA) 108 (90 Base) MCG/ACT inhaler Inhale 2 puffs into the lungs every 6 (six) hours as needed for wheezing or shortness of breath. 8 g 2   ALPRAZolam  (XANAX ) 0.5 MG tablet Take 0.5-1 tablets (0.25-0.5 mg  total) by mouth 2 (two) times daily as needed for anxiety. 30 tablet 0   aspirin 81 MG tablet Take by mouth.     Blood Glucose Monitoring Suppl (ONE TOUCH ULTRA 2) w/Device KIT 1 each by Does not apply route daily at 2 PM. 1 kit 0   dexamethasone  (DECADRON ) 4 MG tablet Take 5 tablets (20 mg total) by mouth 2 (two) times daily with a meal. 5 tablet 0   glipiZIDE  (GLUCOTROL  XL) 5 MG 24 hr tablet TAKE 1 TABLET(5 MG) BY MOUTH DAILY WITH BREAKFAST 90 tablet 1   glucose blood (ONETOUCH ULTRA) test strip 1 each by Other route daily. Use as instructed 100 each 12   hydrochlorothiazide  (MICROZIDE ) 12.5 MG capsule TAKE 1 CAPSULE(12.5 MG) BY MOUTH DAILY 90 capsule 0   HYDROcodone -acetaminophen  (NORCO/VICODIN) 5-325 MG tablet Take 1-2 tablets by mouth every 4 (four) hours as needed for moderate pain (pain score 4-6). 60 tablet 0   latanoprost (XALATAN) 0.005 % ophthalmic solution 1 drop at bedtime.     lisinopril  (ZESTRIL ) 20 MG tablet TAKE 1 TABLET(20 MG) BY MOUTH DAILY 90 tablet 3   metFORMIN  (GLUCOPHAGE ) 1000 MG tablet Take 1 tablet (1,000 mg total) by mouth 2 (two) times daily with a meal. 180 tablet 3   Multiple Vitamin tablet      naloxone  (NARCAN ) nasal spray 4 mg/0.1 mL SPRAY 1 SPRAY INTO ONE NOSTRIL AS DIRECTED FOR OPIOID OVERDOSE (TURN PERSON ON SIDE AFTER DOSE. IF NO RESPONSE IN 2-3 MINUTES OR PERSON RESPONDS BUT RELAPSES, REPEAT USING A NEW SPRAY DEVICE AND SPRAY INTO THE OTHER NOSTRIL. CALL 911 AFTER USE.) * EMERGENCY USE ONLY * 1 each 0   Omega-3 Fatty Acids (FISH OIL) 1200 MG CPDR Take by mouth daily.     ondansetron  (ZOFRAN ) 8 MG tablet Take 1 tablet (8 mg total) by mouth every 8 (eight) hours as needed for nausea or vomiting. 30 tablet 1   simvastatin  (ZOCOR ) 10 MG tablet TAKE 1 TABLET(10 MG) BY MOUTH DAILY AT 6 PM 90 tablet 2   escitalopram  (LEXAPRO ) 10 MG tablet Take 1 tablet (10 mg total) by mouth daily. (Patient not taking: Reported on 12/25/2023) 30 tablet 0   morphine  (MS CONTIN ) 15 MG 12  hr tablet Take 1 tablet (15 mg total) by mouth every 8 (eight) hours. 45 tablet 0   No current facility-administered medications for this visit.    Review of Systems  Constitutional:  Positive for fatigue. Negative  for appetite change, chills and fever.  HENT:   Negative for hearing loss and voice change.   Eyes:  Negative for eye problems.  Respiratory:  Negative for chest tightness and cough.   Cardiovascular:  Negative for chest pain.  Gastrointestinal:  Negative for abdominal distention, abdominal pain and blood in stool.  Endocrine: Negative for hot flashes.  Genitourinary:  Negative for difficulty urinating and frequency.   Musculoskeletal:  Positive for arthralgias.       Shoulder, upper chest, hip pain  Skin:  Negative for itching and rash.  Neurological:  Negative for extremity weakness.  Hematological:  Negative for adenopathy.  Psychiatric/Behavioral:  Negative for confusion.      PHYSICAL EXAMINATION: ECOG PERFORMANCE STATUS: 1 - Symptomatic but completely ambulatory  Vitals:   12/25/23 1346  BP: (!) 109/59  Pulse: 95  Resp: 18  Temp: 98.4 F (36.9 C)  SpO2: 96%   Filed Weights   12/25/23 1346  Weight: 183 lb (83 kg)    Physical Exam Constitutional:      General: She is not in acute distress.    Appearance: She is not diaphoretic.  HENT:     Head: Normocephalic and atraumatic.     Nose: Nose normal.  Eyes:     General: No scleral icterus. Cardiovascular:     Rate and Rhythm: Normal rate and regular rhythm.  Pulmonary:     Effort: Pulmonary effort is normal. No respiratory distress.     Breath sounds: Normal breath sounds. No wheezing.  Abdominal:     General: Bowel sounds are normal. There is no distension.     Palpations: Abdomen is soft.  Musculoskeletal:        General: Normal range of motion.     Cervical back: Normal range of motion and neck supple.  Skin:    General: Skin is warm and dry.     Findings: No erythema.  Neurological:      Mental Status: She is alert and oriented to person, place, and time. Mental status is at baseline.     Motor: No abnormal muscle tone.  Psychiatric:        Mood and Affect: Mood and affect normal.      LABORATORY DATA:  I have reviewed the data as listed    Latest Ref Rng & Units 12/18/2023    7:51 AM 12/09/2023    3:54 PM 08/30/2023    2:27 PM  CBC  WBC 4.0 - 10.5 K/uL 4.7  6.4  7.1   Hemoglobin 12.0 - 15.0 g/dL 8.6  9.3  87.8   Hematocrit 36.0 - 46.0 % 26.8  29.0  36.4   Platelets 150 - 400 K/uL 140  152  189       Latest Ref Rng & Units 12/09/2023    3:54 PM 08/30/2023    2:27 PM 04/15/2023    9:18 AM  CMP  Glucose 70 - 99 mg/dL 880  92  76   BUN 8 - 23 mg/dL 24  17  13    Creatinine 0.44 - 1.00 mg/dL 9.16  9.32  9.30   Sodium 135 - 145 mmol/L 138  142  138   Potassium 3.5 - 5.1 mmol/L 4.4  4.2  4.2   Chloride 98 - 111 mmol/L 99  99  98   CO2 22 - 32 mmol/L 26  23  24    Calcium 8.9 - 10.3 mg/dL 89.8  9.9  9.5   Total Protein 6.5 -  8.1 g/dL 9.8  8.1  7.6   Total Bilirubin 0.0 - 1.2 mg/dL 0.5  0.5  0.5   Alkaline Phos 38 - 126 U/L 41  60  59   AST 15 - 41 U/L 24  21  16    ALT 0 - 44 U/L 15  13  16       RADIOGRAPHIC STUDIES: I have personally reviewed the radiological images as listed and agreed with the findings in the report. IR BONE MARROW BIOPSY & ASPIRATION Result Date: 12/18/2023 INDICATION: MACROCYTIC ANEMIA, CONCERN FOR MYELOMA EXAM: FLUOROSCOPIC GUIDED LEFT ILIAC BONE MARROW ASPIRATION AND CORE BIOPSY Date:  12/18/2023 12/18/2023 9:02 am Radiologist:  M. Frederic Specking, MD Guidance:  Fluoroscopy FLUOROSCOPY: Fluoroscopy Time: 3 minutes 30 seconds (262 mGy). MEDICATIONS: 1% lidocaine  local ANESTHESIA/SEDATION: 2.0 mg IV Versed ; 100 mcg IV Fentanyl  Moderate Sedation Time:  20 minutes The patient was continuously monitored during the procedure by the interventional radiology nurse under my direct supervision. CONTRAST:  None. COMPLICATIONS: None PROCEDURE: Informed consent  was obtained from the patient following explanation of the procedure, risks, benefits and alternatives. The patient understands, agrees and consents for the procedure. All questions were addressed. A time out was performed. The patient was positioned prone and fluoroscopic localization was performed of the pelvis to demonstrate the iliac marrow spaces. Maximal barrier sterile technique utilized including caps, mask, sterile gowns, sterile gloves, large sterile drape, hand hygiene, and Betadine prep. Under sterile conditions and local anesthesia, an 11 gauge coaxial bone biopsy needle was advanced into the left iliac marrow space. Needle position was confirmed with fluoroscopic imaging. Initially, bone marrow aspiration was performed. Next, the 11 gauge outer cannula was utilized to obtain a left iliac bone marrow core biopsy. Needle was removed. Hemostasis was obtained with compression. The patient tolerated the procedure well. Samples were prepared with the cytotechnologist. No immediate complications. IMPRESSION: Fluoroscopic guided left iliac bone marrow aspiration and core biopsy. Electronically Signed   By: CHRISTELLA.  Shick M.D.   On: 12/18/2023 09:16   NM PET Image Initial (PI) Skull Base To Thigh (F-18 FDG) Result Date: 12/15/2023 CLINICAL DATA:  Initial treatment strategy for lytic bone lesions. EXAM: NUCLEAR MEDICINE PET SKULL BASE TO THIGH TECHNIQUE: 9.93 mCi F-18 FDG was injected intravenously. Full-ring PET imaging was performed from the skull base to thigh after the radiotracer. CT data was obtained and used for attenuation correction and anatomic localization. Fasting blood glucose: 67 mg/dl COMPARISON:  Outside MRI examination 12/06/2023 FINDINGS: Mediastinal blood pool activity: SUV max 1.8 Liver activity: SUV max NA NECK: No hypermetabolic lymph nodes in the neck. Incidental CT findings: None. CHEST: No hypermetabolic mediastinal or hilar nodes. No suspicious pulmonary nodules on the CT scan. Incidental  CT findings: None. ABDOMEN/PELVIS: No abnormal hypermetabolic activity within the liver, pancreas, adrenal glands, or spleen. No hypermetabolic lymph nodes in the abdomen or pelvis. Incidental CT findings: Status post cholecystectomy. No biliary dilatation. Minimal scattered aortic calcifications. No aneurysm. Simple left renal cyst not requiring any further imaging evaluation or follow-up. Small amount of free pelvic fluid noted. SKELETON: Diffuse and extensive lytic bone lesions throughout the axial and appendicular skeleton most likely reflecting multiple myeloma as there are no findings for a primary neoplastic process involving the neck, chest, abdomen pelvis. Pathologic fracture involving the right clavicle. The clavicle as a mod the in appearance with SUV max of 6.9. The second left anterior rib is completely destroyed and has an associated large soft tissue mass. SUV max is 5.6. Associated  small adjacent axillary lymph node has an SUV max of 4.1. The lower sternum is completely destroyed with associated soft tissue mass. SUV max is 6.2. Linear band of hypermetabolism in the right gluteus medius muscle is likely traumatic or inflammatory. There is also a focus of hypermetabolism in the left gluteus maximus muscle which is indeterminate. Incidental CT findings: Numerous skeletal lytic lesions but no soft tissue mass causing spinal canal compromise. There are several calcified disc protrusions noted in the thoracic and spines. IMPRESSION: 1. Diffuse and extensive lytic bone lesions throughout the axial and appendicular skeleton most likely reflecting multiple myeloma. 2. Pathologic fracture involving the right clavicle. 3. Large soft tissue masses associated with the left second anterior rib and the lower sternum. 4. No findings for a primary neoplastic process involving the neck, chest, abdomen or pelvis. Electronically Signed   By: MYRTIS Stammer M.D.   On: 12/15/2023 15:44

## 2023-12-25 NOTE — Assessment & Plan Note (Signed)
 Secondary to multiple myeloma.  Monitor closely.

## 2023-12-25 NOTE — Assessment & Plan Note (Addendum)
 Bone marrow biopsy results, PET scan findings, lab results were reviewed and discussed with patient. Diagnosis of IgA kappa multiple myeloma was reviewed and discussed.  Lab Results  Component Value Date   MPROTEIN 4.1 (H) 12/09/2023   KPAFRELGTCHN 1,104.9 (H) 12/09/2023   LAMBDASER 3.7 (L) 12/09/2023   KAPLAMBRATIO 298.62 (H) 12/09/2023    Cytogenetics and multiple myeloma FISH testing results are pending. Patient is a transplant candidate.  Recommend chemotherapy treatments with Dara RVD.  Rationale potential side effects were reviewed and discussed with patient. Will arrange chemotherapy class.  Check Revlimid insurance coverage. Recommend patient take 1 dose of 20 mg dexamethasone , to bridge to start of chemotherapy.  Discussed about avoiding nephrotoxins.  Encourage oral hydration  Thrombosis prophylaxis, aspirin 81 mg daily. Shingle prophylaxis, acyclovir 

## 2023-12-26 ENCOUNTER — Encounter: Payer: Self-pay | Admitting: Oncology

## 2023-12-26 ENCOUNTER — Telehealth: Payer: Self-pay | Admitting: *Deleted

## 2023-12-26 ENCOUNTER — Encounter (HOSPITAL_COMMUNITY): Payer: Self-pay | Admitting: Oncology

## 2023-12-26 NOTE — Telephone Encounter (Signed)
 Patient saw the doctor yesterday and they put in for Rx  of acyclovir .  Patient wants to let you know that she is already got her shingle shots.  She wants to know why that she is going to be on acyclovir    and is at a part of the treatment plan or other reason. What I can gather of the patient calling about this and I spoke to her was she is trying to figure out why does she need the acyclovir  and she is already got her shingles shots?

## 2023-12-26 NOTE — Telephone Encounter (Signed)
 Spoke to pt and informed her that she does not need to take medication now. Informed her to bring it to first treatment appt and then Dr. Babara will further discuss with her. Pt verbalized understanding.

## 2023-12-26 NOTE — Progress Notes (Signed)
 Pharmacist Chemotherapy Monitoring - Initial Assessment    Anticipated start date: 01/03/24   The following has been reviewed per standard work regarding the patient's treatment regimen: The patient's diagnosis, treatment plan and drug doses, and organ/hematologic function Lab orders and baseline tests specific to treatment regimen  The treatment plan start date, drug sequencing, and pre-medications Prior authorization status  Patient's documented medication list, including drug-drug interaction screen and prescriptions for anti-emetics and supportive care specific to the treatment regimen The drug concentrations, fluid compatibility, administration routes, and timing of the medications to be used The patient's access for treatment and lifetime cumulative dose history, if applicable  The patient's medication allergies and previous infusion related reactions, if applicable   Changes made to treatment plan:  N/A  Follow up needed:  Ensure RBC type & screen; phenotype collected prior to first dara dose    Charlton Boule E Lamyiah Crawshaw, PharmD, BCPS Clinical Pharmacist   12/26/2023  1:44 PM

## 2023-12-30 ENCOUNTER — Ambulatory Visit: Admitting: Family Medicine

## 2023-12-30 ENCOUNTER — Inpatient Hospital Stay

## 2023-12-30 DIAGNOSIS — C7951 Secondary malignant neoplasm of bone: Secondary | ICD-10-CM

## 2023-12-30 NOTE — Progress Notes (Signed)
 [facility] CSW Progress Note  Clinical Social Work introduced self to patient during Patient Education with Raoul Moats, Charity fundraiser.  Provided information regarding CSW role, including counseling, advanced care planning and support group.  Answered questions as needed.  Follow Up Plan:  CSW will follow-up with patient by phone     Macario CHRISTELLA Au, LCSW Clinical Social Worker [facility]

## 2023-12-31 ENCOUNTER — Telehealth: Payer: Self-pay

## 2023-12-31 ENCOUNTER — Encounter (HOSPITAL_COMMUNITY): Payer: Self-pay | Admitting: Oncology

## 2023-12-31 NOTE — Telephone Encounter (Signed)
 Clinical Social Work was referred by medical provider for assessment of psychosocial needs and results of distress screening.  CSW attempted to contact patient by phone.  Left voicemail with contact information and request for return call.

## 2024-01-03 ENCOUNTER — Telehealth: Payer: Self-pay | Admitting: Pharmacist

## 2024-01-03 ENCOUNTER — Encounter: Payer: Self-pay | Admitting: Oncology

## 2024-01-03 ENCOUNTER — Inpatient Hospital Stay: Admitting: Oncology

## 2024-01-03 ENCOUNTER — Other Ambulatory Visit (HOSPITAL_COMMUNITY): Payer: Self-pay

## 2024-01-03 ENCOUNTER — Inpatient Hospital Stay

## 2024-01-03 ENCOUNTER — Other Ambulatory Visit: Payer: Self-pay

## 2024-01-03 ENCOUNTER — Telehealth: Payer: Self-pay | Admitting: Pharmacy Technician

## 2024-01-03 ENCOUNTER — Other Ambulatory Visit: Payer: Self-pay | Admitting: Oncology

## 2024-01-03 VITALS — BP 93/56 | HR 80

## 2024-01-03 VITALS — BP 116/56 | HR 86 | Temp 98.5°F | Resp 16 | Wt 179.0 lb

## 2024-01-03 DIAGNOSIS — Z5111 Encounter for antineoplastic chemotherapy: Secondary | ICD-10-CM | POA: Diagnosis not present

## 2024-01-03 DIAGNOSIS — D649 Anemia, unspecified: Secondary | ICD-10-CM | POA: Insufficient documentation

## 2024-01-03 DIAGNOSIS — D539 Nutritional anemia, unspecified: Secondary | ICD-10-CM

## 2024-01-03 DIAGNOSIS — G893 Neoplasm related pain (acute) (chronic): Secondary | ICD-10-CM | POA: Diagnosis not present

## 2024-01-03 DIAGNOSIS — D538 Other specified nutritional anemias: Secondary | ICD-10-CM

## 2024-01-03 DIAGNOSIS — Z5112 Encounter for antineoplastic immunotherapy: Secondary | ICD-10-CM | POA: Diagnosis not present

## 2024-01-03 DIAGNOSIS — C9000 Multiple myeloma not having achieved remission: Secondary | ICD-10-CM

## 2024-01-03 DIAGNOSIS — E119 Type 2 diabetes mellitus without complications: Secondary | ICD-10-CM | POA: Diagnosis not present

## 2024-01-03 DIAGNOSIS — C7951 Secondary malignant neoplasm of bone: Secondary | ICD-10-CM

## 2024-01-03 DIAGNOSIS — E1169 Type 2 diabetes mellitus with other specified complication: Secondary | ICD-10-CM

## 2024-01-03 DIAGNOSIS — M898X9 Other specified disorders of bone, unspecified site: Secondary | ICD-10-CM

## 2024-01-03 DIAGNOSIS — Z807 Family history of other malignant neoplasms of lymphoid, hematopoietic and related tissues: Secondary | ICD-10-CM | POA: Diagnosis not present

## 2024-01-03 LAB — CBC WITH DIFFERENTIAL (CANCER CENTER ONLY)
Abs Immature Granulocytes: 0.03 K/uL (ref 0.00–0.07)
Basophils Absolute: 0 K/uL (ref 0.0–0.1)
Basophils Relative: 0 %
Eosinophils Absolute: 0.1 K/uL (ref 0.0–0.5)
Eosinophils Relative: 3 %
HCT: 26.1 % — ABNORMAL LOW (ref 36.0–46.0)
Hemoglobin: 8.4 g/dL — ABNORMAL LOW (ref 12.0–15.0)
Immature Granulocytes: 1 %
Lymphocytes Relative: 28 %
Lymphs Abs: 1.4 K/uL (ref 0.7–4.0)
MCH: 34 pg (ref 26.0–34.0)
MCHC: 32.2 g/dL (ref 30.0–36.0)
MCV: 105.7 fL — ABNORMAL HIGH (ref 80.0–100.0)
Monocytes Absolute: 0.5 K/uL (ref 0.1–1.0)
Monocytes Relative: 10 %
Neutro Abs: 2.9 K/uL (ref 1.7–7.7)
Neutrophils Relative %: 58 %
Platelet Count: 145 K/uL — ABNORMAL LOW (ref 150–400)
RBC: 2.47 MIL/uL — ABNORMAL LOW (ref 3.87–5.11)
RDW: 15.8 % — ABNORMAL HIGH (ref 11.5–15.5)
WBC Count: 5 K/uL (ref 4.0–10.5)
nRBC: 0 % (ref 0.0–0.2)

## 2024-01-03 LAB — CMP (CANCER CENTER ONLY)
ALT: 13 U/L (ref 0–44)
AST: 19 U/L (ref 15–41)
Albumin: 2.8 g/dL — ABNORMAL LOW (ref 3.5–5.0)
Alkaline Phosphatase: 57 U/L (ref 38–126)
Anion gap: 14 (ref 5–15)
BUN: 15 mg/dL (ref 8–23)
CO2: 28 mmol/L (ref 22–32)
Calcium: 10.8 mg/dL — ABNORMAL HIGH (ref 8.9–10.3)
Chloride: 94 mmol/L — ABNORMAL LOW (ref 98–111)
Creatinine: 0.66 mg/dL (ref 0.44–1.00)
GFR, Estimated: >60 mL/min (ref >60)
Glucose, Bld: 121 mg/dL — ABNORMAL HIGH (ref 70–99)
Potassium: 4.2 mmol/L (ref 3.5–5.1)
Sodium: 136 mmol/L (ref 135–145)
Total Bilirubin: 0.7 mg/dL (ref 0.0–1.2)
Total Protein: 9.8 g/dL — ABNORMAL HIGH (ref 6.5–8.1)

## 2024-01-03 LAB — PREPARE RBC (CROSSMATCH)

## 2024-01-03 LAB — ABO/RH: ABO/RH(D): A POS

## 2024-01-03 MED ORDER — SODIUM CHLORIDE 0.9 % IV SOLN
INTRAVENOUS | Status: DC
  Filled 2024-01-03: qty 250

## 2024-01-03 MED ORDER — DEXAMETHASONE 4 MG PO TABS
20.0000 mg | ORAL_TABLET | Freq: Once | ORAL | Status: AC
  Administered 2024-01-03: 20 mg via ORAL
  Filled 2024-01-03: qty 5

## 2024-01-03 MED ORDER — ACETAMINOPHEN 325 MG PO TABS
650.0000 mg | ORAL_TABLET | Freq: Once | ORAL | Status: AC
Start: 2024-01-03 — End: 2024-01-03
  Administered 2024-01-03: 650 mg via ORAL
  Filled 2024-01-03: qty 2

## 2024-01-03 MED ORDER — BORTEZOMIB CHEMO SQ INJECTION 3.5 MG (2.5MG/ML)
1.3000 mg/m2 | Freq: Once | INTRAMUSCULAR | Status: AC
  Administered 2024-01-03: 2.5 mg via SUBCUTANEOUS
  Filled 2024-01-03: qty 1

## 2024-01-03 MED ORDER — DARATUMUMAB-HYALURONIDASE-FIHJ 1800-30000 MG-UT/15ML ~~LOC~~ SOLN
1800.0000 mg | Freq: Once | SUBCUTANEOUS | Status: AC
  Administered 2024-01-03: 1800 mg via SUBCUTANEOUS
  Filled 2024-01-03: qty 15

## 2024-01-03 MED ORDER — DIPHENHYDRAMINE HCL 25 MG PO CAPS
50.0000 mg | ORAL_CAPSULE | Freq: Once | ORAL | Status: AC
Start: 2024-01-03 — End: 2024-01-03
  Administered 2024-01-03: 50 mg via ORAL
  Filled 2024-01-03: qty 2

## 2024-01-03 MED ORDER — MONTELUKAST SODIUM 10 MG PO TABS
10.0000 mg | ORAL_TABLET | Freq: Once | ORAL | Status: AC
  Administered 2024-01-03: 10 mg via ORAL
  Filled 2024-01-03: qty 1

## 2024-01-03 MED ORDER — MONTELUKAST SODIUM 10 MG PO TABS
10.0000 mg | ORAL_TABLET | Freq: Every day | ORAL | 0 refills | Status: AC | PRN
Start: 2024-01-03 — End: ?

## 2024-01-03 MED ORDER — HYDROCODONE-ACETAMINOPHEN 5-325 MG PO TABS: 1.0000 | ORAL_TABLET | ORAL | 0 refills | Status: DC | PRN

## 2024-01-03 MED ORDER — LENALIDOMIDE 25 MG PO CAPS: 25.0000 mg | ORAL_CAPSULE | Freq: Every day | ORAL | 0 refills | Status: DC

## 2024-01-03 MED ORDER — ZOLEDRONIC ACID 4 MG/100ML IV SOLN
4.0000 mg | Freq: Once | INTRAVENOUS | Status: AC
  Administered 2024-01-03: 4 mg via INTRAVENOUS
  Filled 2024-01-03: qty 100

## 2024-01-03 NOTE — Assessment & Plan Note (Signed)
 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 Recommend Zometa  4 mg x 1 -emergent use. Rationale and potential side effects were reviewed discussed with patient.  She agrees with the plan.  Currently she has no known dental issue. I recommend patient to get dental evaluation for future Zometa  use.

## 2024-01-03 NOTE — Telephone Encounter (Signed)
 Oral Oncology Patient Advocate Encounter  Prior Authorization for Lenalidomide  has been approved.    PA# [id number]  Effective dates: 01/03/2024 through 01/02/2025  Patients co-pay is $1,052.45.    Connor Meacham (Patty) Chet Burnet, CPhT  [facility], Zelda Salmon, [facility] Patient Advocate Specialist III Phone: [phone]  Fax: [phone]

## 2024-01-03 NOTE — Patient Instructions (Signed)
 [facility] - A DEPT OF [facility]  Discharge Instructions: Thank you for choosing [facility] to provide your oncology and hematology care.  If you have a lab appointment with the Cancer Center, please go directly to the Cancer Center and check in at the registration area.  Wear comfortable clothing and clothing appropriate for easy access to any Portacath or PICC line.   We strive to give you quality time with your provider. You may need to reschedule your appointment if you arrive late (15 or more minutes).  Arriving late affects you and other patients whose appointments are after yours.  Also, if you miss three or more appointments without notifying the office, you may be dismissed from the clinic at the provider's discretion.      For prescription refill requests, have your pharmacy contact our office and allow 72 hours for refills to be completed.    Today you received the following chemotherapy and/or immunotherapy agents- velcade , daratumumab       To help prevent nausea and vomiting after your treatment, we encourage you to take your nausea medication as directed.  BELOW ARE SYMPTOMS THAT SHOULD BE REPORTED IMMEDIATELY: *FEVER GREATER THAN 100.4 F (38 C) OR HIGHER *CHILLS OR SWEATING *NAUSEA AND VOMITING THAT IS NOT CONTROLLED WITH YOUR NAUSEA MEDICATION *UNUSUAL SHORTNESS OF BREATH *UNUSUAL BRUISING OR BLEEDING *URINARY PROBLEMS (pain or burning when urinating, or frequent urination) *BOWEL PROBLEMS (unusual diarrhea, constipation, pain near the anus) TENDERNESS IN MOUTH AND THROAT WITH OR WITHOUT PRESENCE OF ULCERS (sore throat, sores in mouth, or a toothache) UNUSUAL RASH, SWELLING OR PAIN  UNUSUAL VAGINAL DISCHARGE OR ITCHING   Items with * indicate a potential emergency and should be followed up as soon as possible or go to the Emergency Department if any problems should occur.  Please show the CHEMOTHERAPY ALERT CARD or  IMMUNOTHERAPY ALERT CARD at check-in to the Emergency Department and triage nurse.  Should you have questions after your visit or need to cancel or reschedule your appointment, please contact [facility] - A DEPT OF [facility]  [phone] and follow the prompts.  Office hours are 8:00 a.m. to 4:30 p.m. Monday - Friday. Please note that voicemails left after 4:00 p.m. may not be returned until the following business day.  We are closed weekends and major holidays. You have access to a nurse at all times for urgent questions. Please call the main number to the clinic [phone] and follow the prompts.  For any non-urgent questions, you may also contact your provider using MyChart. We now offer e-Visits for anyone 98 and older to request care online for non-urgent symptoms. For details visit [email or web address].   Also download the MyChart app! Go to the app store, search MyChart, open the app, select [place], and log in with your MyChart username and password.

## 2024-01-03 NOTE — Assessment & Plan Note (Signed)
 Continue Norco 5/325mg  Q4-6hour PRN.  Recommend to increase MS Contin  15 mg twice daily to every 8 hours.

## 2024-01-03 NOTE — Assessment & Plan Note (Signed)
 Recommend 1 unit PRBC transfusion

## 2024-01-03 NOTE — Assessment & Plan Note (Addendum)
 Bone marrow biopsy results, PET scan findings, lab results were reviewed and discussed with patient. Diagnosis of IgA kappa multiple myeloma was reviewed and discussed.  Lab Results  Component Value Date   MPROTEIN 4.1 (H) 12/09/2023   KPAFRELGTCHN 1,104.9 (H) 12/09/2023   LAMBDASER 3.7 (L) 12/09/2023   KAPLAMBRATIO 298.62 (H) 12/09/2023    Normal Cytogenetics and multiple myeloma FISH testing results are pending. Patient is a transplant candidate.   Labs are reviewed and discussed with patient. Proceed with Dara VD.  Awaiting for Revlimid  approval.   Thrombosis prophylaxis, aspirin 81 mg daily. Shingle prophylaxis: acyclovir 

## 2024-01-03 NOTE — Telephone Encounter (Signed)
 Oral Oncology Patient Advocate Encounter  Was successful in securing patient a $8000 grant from [facility] to provide copayment coverage for Lenalidomide .  This will keep the out of pocket expense at $0.     [employer] ID: [id number]   The billing information is as follows and will be shared with appropriate pharmacy.    RxBin: [id number] PCN: [id number] Member ID: [id number] Group ID: [id number] Dates of Eligibility: 12/04/2023 through 12/02/2024  Fund:  Multiple Myeloma - Medicare Access  [place] (Patty) Chet Burnet, CPhT  [facility], [place], [place] Oral Chemotherapy Patient Advocate Specialist III Phone: [phone]  Fax: [phone]

## 2024-01-03 NOTE — Assessment & Plan Note (Signed)
 Secondary to multiple myeloma.  Monitor closely.

## 2024-01-03 NOTE — Assessment & Plan Note (Signed)
 Patient is currently on metformin  and glipizide .  Anticipate blood sugar reading glucose level due to steroid use during treatments.  Recommend patient to follow-up closely with primary care provider for management.

## 2024-01-03 NOTE — Telephone Encounter (Signed)
 Oral Oncology Patient Advocate Encounter   Received notification that prior authorization for Lenalidomide  is required.   PA submitted on CMM via Latent Key [id number]  Status is pending     Raven Harmes (Patty) Chet Burnet, CPhT  [facility] - [facility], [place], [place] Oral Chemotherapy Patient Advocate Specialist III Phone: [phone]  Fax: [phone]

## 2024-01-03 NOTE — Progress Notes (Signed)
 Hematology/Oncology Progress note [phone] [phone]        REFERRING PROVIDER: Babara Call, MD    CHIEF COMPLAINTS/PURPOSE OF CONSULTATION:  Multiple myeloma  ASSESSMENT & PLAN:   Multiple myeloma (HCC) Bone marrow biopsy results, PET scan findings, lab results were reviewed and discussed with patient. Diagnosis of IgA kappa multiple myeloma was reviewed and discussed.  Lab Results  Component Value Date   MPROTEIN 4.1 (H) 12/09/2023   KPAFRELGTCHN 1,104.9 (H) 12/09/2023   LAMBDASER 3.7 (L) 12/09/2023   KAPLAMBRATIO 298.62 (H) 12/09/2023    Normal Cytogenetics and multiple myeloma FISH testing results are pending. Patient is a transplant candidate.   Labs are reviewed and discussed with patient. Proceed with Dara VD.  Awaiting for Revlimid  approval.   Thrombosis prophylaxis, aspirin 81 mg daily. 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  Monitor closely.  Neoplasm related pain Continue Norco 5/325mg  Q4-6hour PRN.  Recommend to increase MS Contin  15 mg twice daily to every 8 hours.   T2DM (type 2 diabetes mellitus) (HCC) Patient is currently on metformin  and glipizide .  Anticipate blood sugar reading glucose level due to steroid use during treatments.  Recommend patient to follow-up closely with primary care provider for management.  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 Recommend Zometa  4 mg x 1 -emergent use. Rationale and potential side effects were reviewed discussed with patient.  She agrees with the plan.  Currently she has no known dental issue. I recommend patient to get dental evaluation for future Zometa  use.  Symptomatic anemia Recommend 1 unit PRBC transfusion   Orders Placed This Encounter  Procedures   ABO/Rh    Standing Status:   Future    Number of Occurrences:   1    Expected Date:   01/03/2024    Expiration Date:   04/02/2024   Follow-up per LOS All questions were  answered. The patient knows to call the clinic with any problems, questions or concerns.  Call Babara, MD, PhD [facility] Hematology Oncology 01/03/2024    HISTORY OF PRESENTING ILLNESS:  Dana Wallace 68 y.o. female presents to establish care for metastatic bone lesions.   Oncology History  Multiple myeloma (HCC)  12/09/2023 Initial Diagnosis   Multiple myeloma (HCC)  She initially experienced chest discomfort starting in her left shoulder in February, which persisted and led her to seek medical attention in April. Despite a comprehensive workup including a chest x-ray, EKG, echocardiogram, mammogram, and a DEXA scan, all results were normal.  In May, she had two follow-up visits with her primary care physician. The discomfort was primarily located behind her left breast and later moved to her right clavicle and sternum area, becoming very painful and tender to touch. An x-ray at [facility] was performed, and the patient was told there was some type of lesion, after which she underwent an MRI of the right clavicle, shoulder, and scapula. Interval marrow biopsy, lab workup and PET scan. She was found to have stage II IgA kappa multiple myeloma.   12/15/2023 Imaging   PET scan showed  1. Diffuse and extensive lytic bone lesions throughout the axial and appendicular skeleton most likely reflecting multiple myeloma. 2. Pathologic fracture involving the right clavicle. 3. Large soft tissue masses associated with the left second anterior rib and the lower sternum. 4. No findings for a primary neoplastic process involving the neck, chest, abdomen or pelvis   12/18/2023 Bone Marrow Biopsy   Pulmonary biopsy aspirate, clot, core Showed  hypercellular bone marrow, involved by kappa restricted plasma cell neoplasm compromising 30-100%, average 70%, of the cellular marrow.   12/25/2023 Cancer Staging   Staging form: Plasma Cell Myeloma and Plasma Cell Disorders, AJCC 8th Edition - Clinical  stage from 12/25/2023: Beta-2 -microglobulin (mg/L): 3.9, Albumin (g/dL): 3.2, ISS: Stage II, LDH: Normal - Signed by Babara Call, MD on 12/25/2023 Stage prefix: Initial diagnosis Beta 2 microglobulin range (mg/L): 3.5 to 5.49 Albumin range (g/dL): Less than 3.5   1/84/7974 -  Chemotherapy   Patient is on Treatment Plan : MYELOMA NEWLY DIAGNOSED TRANSPLANT CANDIDATE DaraVRd (Daratumumab  SQ) q21d x 6 Cycles (Induction/Consolidation)       She has pain of upper chest and back, right shoulder, and the left hip.   Patient currently takes Norco as needed as well as MS Contin  15 mg Q8h  She takes about 5-6 Norco per 24 hours.  Pain control is better. Accompanied by husband.  MEDICAL HISTORY:  Past Medical History:  Diagnosis Date   Anxiety    Diabetes mellitus without complication (HCC)    Hypertension     SURGICAL HISTORY: Past Surgical History:  Procedure Laterality Date   CESAREAN SECTION     CHOLECYSTECTOMY     IR BONE MARROW BIOPSY & ASPIRATION  12/18/2023   TUBAL LIGATION      SOCIAL HISTORY: Social History   Socioeconomic History   Marital status: Married    Spouse name: Not on file   Number of children: Not on file   Years of education: Not on file   Highest education level: Associate degree: academic program  Occupational History   Not on file  Tobacco Use   Smoking status: Never   Smokeless tobacco: Never  Substance and Sexual Activity   Alcohol use: No   Drug use: Never   Sexual activity: Not Currently    Birth control/protection: None  Other Topics Concern   Not on file  Social History Narrative   Not on file   Social Drivers of Health   Financial Resource Strain: Low Risk  (08/29/2023)   Overall Financial Resource Strain (CARDIA)    Difficulty of Paying Living Expenses: Not hard at all  Food Insecurity: No Food Insecurity (12/09/2023)   Hunger Vital Sign    Worried About Running Out of Food in the Last Year: Never true    Ran Out of Food in the Last Year:  Never true  Transportation Needs: No Transportation Needs (12/09/2023)   PRAPARE - Administrator, Civil Service (Medical): No    Lack of Transportation (Non-Medical): No  Physical Activity: Sufficiently Active (10/03/2023)   Exercise Vital Sign    Days of Exercise per Week: 6 days    Minutes of Exercise per Session: 30 min  Recent Concern: Physical Activity - Inactive (08/29/2023)   Exercise Vital Sign    Days of Exercise per Week: 0 days    Minutes of Exercise per Session: 30 min  Stress: Stress Concern Present (08/29/2023)   [employer] of Occupational Health - Occupational Stress Questionnaire    Feeling of Stress : To some extent  Social Connections: Moderately Isolated (08/29/2023)   Social Connection and Isolation Panel    Frequency of Communication with Friends and Family: More than three times a week    Frequency of Social Gatherings with Friends and Family: Once a week    Attends Religious Services: Never    Database administrator or Organizations: No    Attends Club or  Organization Meetings: Not on file    Marital Status: Married  Intimate Partner Violence: Not At Risk (12/09/2023)   Humiliation, Afraid, Rape, and Kick questionnaire    Fear of Current or Ex-Partner: No    Emotionally Abused: No    Physically Abused: No    Sexually Abused: No    FAMILY HISTORY: Family History  Problem Relation Age of Onset   COPD Mother    Multiple sclerosis Mother    Varicose Veins Mother    Hodgkin's lymphoma Sister    Cancer Sister    Breast cancer Neg Hx     ALLERGIES:  has no known allergies.  MEDICATIONS:  Current Outpatient Medications  Medication Sig Dispense Refill   acyclovir  (ZOVIRAX ) 400 MG tablet Take 1 tablet (400 mg total) by mouth 2 (two) times daily. 60 tablet 3   albuterol  (VENTOLIN  HFA) 108 (90 Base) MCG/ACT inhaler Inhale 2 puffs into the lungs every 6 (six) hours as needed for wheezing or shortness of breath. 8 g 2   ALPRAZolam  (XANAX ) 0.5 MG  tablet Take 0.5-1 tablets (0.25-0.5 mg total) by mouth 2 (two) times daily as needed for anxiety. 30 tablet 0   aspirin 81 MG tablet Take by mouth.     Blood Glucose Monitoring Suppl (ONE TOUCH ULTRA 2) w/Device KIT 1 each by Does not apply route daily at 2 PM. 1 kit 0   dexamethasone  (DECADRON ) 4 MG tablet Take 5 tablets (20 mg total) by mouth 2 (two) times daily with a meal. 5 tablet 0   glipiZIDE  (GLUCOTROL  XL) 5 MG 24 hr tablet TAKE 1 TABLET(5 MG) BY MOUTH DAILY WITH BREAKFAST 90 tablet 1   glucose blood (ONETOUCH ULTRA) test strip 1 each by Other route daily. Use as instructed 100 each 12   hydrochlorothiazide  (MICROZIDE ) 12.5 MG capsule TAKE 1 CAPSULE(12.5 MG) BY MOUTH DAILY 90 capsule 0   latanoprost (XALATAN) 0.005 % ophthalmic solution 1 drop at bedtime.     lisinopril  (ZESTRIL ) 20 MG tablet TAKE 1 TABLET(20 MG) BY MOUTH DAILY 90 tablet 3   metFORMIN  (GLUCOPHAGE ) 1000 MG tablet Take 1 tablet (1,000 mg total) by mouth 2 (two) times daily with a meal. 180 tablet 3   montelukast  (SINGULAIR ) 10 MG tablet Take 1 tablet (10 mg total) by mouth daily as needed (wheezing). 10 tablet 0   morphine  (MS CONTIN ) 15 MG 12 hr tablet Take 1 tablet (15 mg total) by mouth every 8 (eight) hours. 45 tablet 0   Multiple Vitamin tablet      naloxone  (NARCAN ) nasal spray 4 mg/0.1 mL SPRAY 1 SPRAY INTO ONE NOSTRIL AS DIRECTED FOR OPIOID OVERDOSE (TURN PERSON ON SIDE AFTER DOSE. IF NO RESPONSE IN 2-3 MINUTES OR PERSON RESPONDS BUT RELAPSES, REPEAT USING A NEW SPRAY DEVICE AND SPRAY INTO THE OTHER NOSTRIL. CALL 911 AFTER USE.) * EMERGENCY USE ONLY * 1 each 0   Omega-3 Fatty Acids (FISH OIL) 1200 MG CPDR Take by mouth daily.     ondansetron  (ZOFRAN ) 8 MG tablet Take 1 tablet (8 mg total) by mouth every 8 (eight) hours as needed for nausea or vomiting. 30 tablet 1   simvastatin  (ZOCOR ) 10 MG tablet TAKE 1 TABLET(10 MG) BY MOUTH DAILY AT 6 PM 90 tablet 2   escitalopram  (LEXAPRO ) 10 MG tablet Take 1 tablet (10 mg total)  by mouth daily. (Patient not taking: Reported on 12/25/2023) 30 tablet 0   HYDROcodone -acetaminophen  (NORCO/VICODIN) 5-325 MG tablet Take 1-2 tablets by mouth every 4 (four)  hours as needed for moderate pain (pain score 4-6). 60 tablet 0   lenalidomide  (REVLIMID ) 25 MG capsule Take 1 capsule (25 mg total) by mouth daily. Take for 7 days, then hold for 7 days. 7 capsule 0   No current facility-administered medications for this visit.    Review of Systems  Constitutional:  Positive for fatigue. Negative for appetite change, chills and fever.  HENT:   Negative for hearing loss and voice change.   Eyes:  Negative for eye problems.  Respiratory:  Negative for chest tightness and cough.   Cardiovascular:  Negative for chest pain.  Gastrointestinal:  Negative for abdominal distention, abdominal pain and blood in stool.  Endocrine: Negative for hot flashes.  Genitourinary:  Negative for difficulty urinating and frequency.   Musculoskeletal:  Positive for arthralgias.       Shoulder, upper chest, hip pain  Skin:  Negative for itching and rash.  Neurological:  Negative for extremity weakness.  Hematological:  Negative for adenopathy.  Psychiatric/Behavioral:  Negative for confusion.      PHYSICAL EXAMINATION: ECOG PERFORMANCE STATUS: 1 - Symptomatic but completely ambulatory  Vitals:   01/03/24 0911 01/03/24 0916  BP: (!) 116/48 (!) 116/56  Pulse: 86   Resp: 16   Temp: 98.5 F (36.9 C)   SpO2: 97%    Filed Weights   01/03/24 0911  Weight: 179 lb (81.2 kg)    Physical Exam Constitutional:      General: She is not in acute distress.    Appearance: She is not diaphoretic.  HENT:     Head: Normocephalic and atraumatic.     Nose: Nose normal.  Eyes:     General: No scleral icterus. Cardiovascular:     Rate and Rhythm: Normal rate and regular rhythm.  Pulmonary:     Effort: Pulmonary effort is normal. No respiratory distress.     Breath sounds: Normal breath sounds. No wheezing.   Abdominal:     General: Bowel sounds are normal. There is no distension.     Palpations: Abdomen is soft.  Musculoskeletal:        General: Normal range of motion.     Cervical back: Normal range of motion and neck supple.  Skin:    General: Skin is warm and dry.     Findings: No erythema.  Neurological:     Mental Status: She is alert and oriented to person, place, and time. Mental status is at baseline.     Motor: No abnormal muscle tone.  Psychiatric:        Mood and Affect: Mood and affect normal.      LABORATORY DATA:  I have reviewed the data as listed    Latest Ref Rng & Units 01/03/2024    8:48 AM 12/18/2023    7:51 AM 12/09/2023    3:54 PM  CBC  WBC 4.0 - 10.5 K/uL 5.0  4.7  6.4   Hemoglobin 12.0 - 15.0 g/dL 8.4  8.6  9.3   Hematocrit 36.0 - 46.0 % 26.1  26.8  29.0   Platelets 150 - 400 K/uL 145  140  152       Latest Ref Rng & Units 01/03/2024    8:48 AM 12/09/2023    3:54 PM 08/30/2023    2:27 PM  CMP  Glucose 70 - 99 mg/dL 878  880  92   BUN 8 - 23 mg/dL 15  24  17    Creatinine 0.44 - 1.00 mg/dL 9.33  0.83  0.67   Sodium 135 - 145 mmol/L 136  138  142   Potassium 3.5 - 5.1 mmol/L 4.2  4.4  4.2   Chloride 98 - 111 mmol/L 94  99  99   CO2 22 - 32 mmol/L 28  26  23    Calcium  8.9 - 10.3 mg/dL 89.1  89.8  9.9   Total Protein 6.5 - 8.1 g/dL 9.8  9.8  8.1   Total Bilirubin 0.0 - 1.2 mg/dL 0.7  0.5  0.5   Alkaline Phos 38 - 126 U/L 57  41  60   AST 15 - 41 U/L 19  24  21    ALT 0 - 44 U/L 13  15  13       RADIOGRAPHIC STUDIES: I have personally reviewed the radiological images as listed and agreed with the findings in the report. IR BONE MARROW BIOPSY & ASPIRATION Result Date: 12/18/2023 INDICATION: MACROCYTIC ANEMIA, CONCERN FOR MYELOMA EXAM: FLUOROSCOPIC GUIDED LEFT ILIAC BONE MARROW ASPIRATION AND CORE BIOPSY Date:  12/18/2023 12/18/2023 9:02 am Radiologist:  M. Frederic Specking, MD Guidance:  Fluoroscopy FLUOROSCOPY: Fluoroscopy Time: 3 minutes 30 seconds (262 mGy).  MEDICATIONS: 1% lidocaine  local ANESTHESIA/SEDATION: 2.0 mg IV Versed ; 100 mcg IV Fentanyl  Moderate Sedation Time:  20 minutes The patient was continuously monitored during the procedure by the interventional radiology nurse under my direct supervision. CONTRAST:  None. COMPLICATIONS: None PROCEDURE: Informed consent was obtained from the patient following explanation of the procedure, risks, benefits and alternatives. The patient understands, agrees and consents for the procedure. All questions were addressed. A time out was performed. The patient was positioned prone and fluoroscopic localization was performed of the pelvis to demonstrate the iliac marrow spaces. Maximal barrier sterile technique utilized including caps, mask, sterile gowns, sterile gloves, large sterile drape, hand hygiene, and Betadine prep. Under sterile conditions and local anesthesia, an 11 gauge coaxial bone biopsy needle was advanced into the left iliac marrow space. Needle position was confirmed with fluoroscopic imaging. Initially, bone marrow aspiration was performed. Next, the 11 gauge outer cannula was utilized to obtain a left iliac bone marrow core biopsy. Needle was removed. Hemostasis was obtained with compression. The patient tolerated the procedure well. Samples were prepared with the cytotechnologist. No immediate complications. IMPRESSION: Fluoroscopic guided left iliac bone marrow aspiration and core biopsy. Electronically Signed   By: CHRISTELLA.  Shick M.D.   On: 12/18/2023 09:16   NM PET Image Initial (PI) Skull Base To Thigh (F-18 FDG) Result Date: 12/15/2023 CLINICAL DATA:  Initial treatment strategy for lytic bone lesions. EXAM: NUCLEAR MEDICINE PET SKULL BASE TO THIGH TECHNIQUE: 9.93 mCi F-18 FDG was injected intravenously. Full-ring PET imaging was performed from the skull base to thigh after the radiotracer. CT data was obtained and used for attenuation correction and anatomic localization. Fasting blood glucose: 67 mg/dl  COMPARISON:  Outside MRI examination 12/06/2023 FINDINGS: Mediastinal blood pool activity: SUV max 1.8 Liver activity: SUV max NA NECK: No hypermetabolic lymph nodes in the neck. Incidental CT findings: None. CHEST: No hypermetabolic mediastinal or hilar nodes. No suspicious pulmonary nodules on the CT scan. Incidental CT findings: None. ABDOMEN/PELVIS: No abnormal hypermetabolic activity within the liver, pancreas, adrenal glands, or spleen. No hypermetabolic lymph nodes in the abdomen or pelvis. Incidental CT findings: Status post cholecystectomy. No biliary dilatation. Minimal scattered aortic calcifications. No aneurysm. Simple left renal cyst not requiring any further imaging evaluation or follow-up. Small amount of free pelvic fluid noted. SKELETON: Diffuse and extensive lytic  bone lesions throughout the axial and appendicular skeleton most likely reflecting multiple myeloma as there are no findings for a primary neoplastic process involving the neck, chest, abdomen pelvis. Pathologic fracture involving the right clavicle. The clavicle as a mod the in appearance with SUV max of 6.9. The second left anterior rib is completely destroyed and has an associated large soft tissue mass. SUV max is 5.6. Associated small adjacent axillary lymph node has an SUV max of 4.1. The lower sternum is completely destroyed with associated soft tissue mass. SUV max is 6.2. Linear band of hypermetabolism in the right gluteus medius muscle is likely traumatic or inflammatory. There is also a focus of hypermetabolism in the left gluteus maximus muscle which is indeterminate. Incidental CT findings: Numerous skeletal lytic lesions but no soft tissue mass causing spinal canal compromise. There are several calcified disc protrusions noted in the thoracic and spines. IMPRESSION: 1. Diffuse and extensive lytic bone lesions throughout the axial and appendicular skeleton most likely reflecting multiple myeloma. 2. Pathologic fracture  involving the right clavicle. 3. Large soft tissue masses associated with the left second anterior rib and the lower sternum. 4. No findings for a primary neoplastic process involving the neck, chest, abdomen or pelvis. Electronically Signed   By: MYRTIS Stammer M.D.   On: 12/15/2023 15:44

## 2024-01-03 NOTE — Telephone Encounter (Addendum)
 Clinical Pharmacist Practitioner Encounter   Received new prescription for Revlimid  (lenalidomide ) for the treatment of multiple myeloma in conjunction with daratumumab , bortezomib , and dexamethasone , planned duration until disease progression or unacceptable drug toxicity.  CMP from 01/03/24 assessed, no relevant lab abnormalities. Prescription dose and frequency assessed.   Patient will have a short course of lenalidomide  for cycle 1 to align with her in office treatment 7 days on/ 7 days off, with the plan on starting on 01/10/24. With cycle 2 patient will return to normal cycle for 14 days on/ 7 days off.  Current medication list in Epic reviewed, no DDIs with lenalidomide  identified.  Evaluated chart and no patient barriers to medication adherence identified.   Oral Oncology Clinic will continue to follow for insurance authorization, copayment issues, initial counseling and start date.   Tashunda Vandezande N. Preslyn Warr, PharmD, BCOP, CPP Hematology/Oncology Clinical Pharmacist [facility] Oral Chemotherapy Navigation Clinic [phone]  01/03/2024 10:06 AM

## 2024-01-03 NOTE — Telephone Encounter (Signed)
 Clinical Pharmacist Practitioner Encounter   Patient Education I spoke with patient for overview of new oral chemotherapy medication: Revlimid  (lenalidomide ) for the treatment of multiple myeloma in conjunction with daratumumab , bortezomib , and dexamethasone , planned duration until disease progression or unacceptable drug toxicity.   Treatment goal: Control  Counseled patient on administration, dosing, side effects, monitoring, drug-food interactions, safe handling, storage, and disposal. Patient will take 1 capsule (25 mg total) by mouth daily. Take for 14 days, then hold for 7 days.  Patient aware she will have a short course of lenalidomide  for cycle 1 to align with her in office treatment 7 days on/ 7 days off, with the plan on starting on 01/10/24. With cycle 2 patient will return to normal cycle for 14 days on/ 7 days off.  *Patient confirmed she is already taking aspirin 81mg  daily  Side effects include but not limited to: rash/itchy skin, N/V, fatigue, decreased wbc/hgb/plt, constipation or diarrhea.    Reviewed with patient importance of keeping a medication schedule and plan for any missed doses.  After discussion with patient no patient barriers to medication adherence identified.   Distress evaluation: Distress thermometer not completed during telephone call as patient this was already completed in chemo edu class.   Communication and Learning Assessment Primary learner: patient Barriers to learning: No barriers Preferred language: English Learning preferences: Listening Reading   Dana Wallace voiced understanding and appreciation. All questions answered. Medication handout provided.  Provided patient with Oral Chemotherapy Navigation Clinic phone number. Patient knows to call the office with questions or concerns. Oral Chemotherapy Navigation Clinic will continue to follow.  Dana Wallace, PharmD, BCOP, CPP Hematology/Oncology Clinical Pharmacist [facility] Oral  Chemotherapy Navigation Clinic [phone]  01/03/2024 3:48 PM

## 2024-01-04 LAB — PRETREATMENT RBC PHENOTYPE: DAT, IgG: NEGATIVE

## 2024-01-06 ENCOUNTER — Encounter: Payer: Self-pay | Admitting: Oncology

## 2024-01-06 ENCOUNTER — Inpatient Hospital Stay

## 2024-01-06 ENCOUNTER — Telehealth: Payer: Self-pay | Admitting: Pharmacy Technician

## 2024-01-06 DIAGNOSIS — Z5112 Encounter for antineoplastic immunotherapy: Secondary | ICD-10-CM | POA: Diagnosis not present

## 2024-01-06 DIAGNOSIS — Z807 Family history of other malignant neoplasms of lymphoid, hematopoietic and related tissues: Secondary | ICD-10-CM | POA: Diagnosis not present

## 2024-01-06 DIAGNOSIS — D539 Nutritional anemia, unspecified: Secondary | ICD-10-CM | POA: Diagnosis not present

## 2024-01-06 DIAGNOSIS — C9000 Multiple myeloma not having achieved remission: Secondary | ICD-10-CM

## 2024-01-06 DIAGNOSIS — G893 Neoplasm related pain (acute) (chronic): Secondary | ICD-10-CM | POA: Diagnosis not present

## 2024-01-06 DIAGNOSIS — Z5111 Encounter for antineoplastic chemotherapy: Secondary | ICD-10-CM | POA: Diagnosis not present

## 2024-01-06 DIAGNOSIS — E119 Type 2 diabetes mellitus without complications: Secondary | ICD-10-CM | POA: Diagnosis not present

## 2024-01-06 LAB — MULTIPLE MYELOMA PANEL, SERUM
Albumin SerPl Elph-Mcnc: 3.3 g/dL (ref 2.9–4.4)
Albumin/Glob SerPl: 0.6 — ABNORMAL LOW (ref 0.7–1.7)
Alpha 1: 0.2 g/dL (ref 0.0–0.4)
Alpha2 Glob SerPl Elph-Mcnc: 0.7 g/dL (ref 0.4–1.0)
B-Globulin SerPl Elph-Mcnc: 5.1 g/dL — ABNORMAL HIGH (ref 0.7–1.3)
Gamma Glob SerPl Elph-Mcnc: 0.3 g/dL — ABNORMAL LOW (ref 0.4–1.8)
Globulin, Total: 6.3 g/dL — ABNORMAL HIGH (ref 2.2–3.9)
IgA: 4502 mg/dL — ABNORMAL HIGH (ref 87–352)
IgG (Immunoglobin G), Serum: 280 mg/dL — ABNORMAL LOW (ref 586–1602)
IgM (Immunoglobulin M), Srm: 10 mg/dL — ABNORMAL LOW (ref 26–217)
M Protein SerPl Elph-Mcnc: 4.7 g/dL — ABNORMAL HIGH
Total Protein ELP: 9.6 g/dL — ABNORMAL HIGH (ref 6.0–8.5)

## 2024-01-06 LAB — KAPPA/LAMBDA LIGHT CHAINS
Kappa free light chain: 1540 mg/L — ABNORMAL HIGH (ref 3.3–19.4)
Kappa, lambda light chain ratio: 496.77 — ABNORMAL HIGH (ref 0.26–1.65)
Lambda free light chains: 3.1 mg/L — ABNORMAL LOW (ref 5.7–26.3)

## 2024-01-06 MED ORDER — ACETAMINOPHEN 325 MG PO TABS
650.0000 mg | ORAL_TABLET | Freq: Once | ORAL | Status: AC
  Administered 2024-01-06: 650 mg via ORAL
  Filled 2024-01-06: qty 2

## 2024-01-06 MED ORDER — DIPHENHYDRAMINE HCL 50 MG/ML IJ SOLN
25.0000 mg | Freq: Once | INTRAMUSCULAR | Status: AC
  Administered 2024-01-06: 25 mg via INTRAVENOUS
  Filled 2024-01-06: qty 1

## 2024-01-06 MED ORDER — SODIUM CHLORIDE 0.9% IV SOLUTION
250.0000 mL | INTRAVENOUS | Status: DC
  Administered 2024-01-06: 100 mL via INTRAVENOUS
  Filled 2024-01-06: qty 250

## 2024-01-06 NOTE — Patient Instructions (Signed)

## 2024-01-06 NOTE — Telephone Encounter (Signed)
 Oral Oncology Patient Advocate Encounter  I contacted [employer] to get an update on medication status.  Per [employer] representative the medication is ready to be scheduled for delivery but they have not gotten in contact with the patient.  I have spoken with the patient and she stated that she plans on calling them this afternoon or tomorrow morning if she does not receive a call.  Patient knows to call me if she has any issues scheduling delivery for her medication.  Shan Valdes (Patty) Chet Burnet, CPhT  [facility], Zelda Salmon, [facility] Patient Advocate Specialist III Phone: [phone]  Fax: [phone]

## 2024-01-07 ENCOUNTER — Inpatient Hospital Stay

## 2024-01-07 VITALS — BP 126/46 | HR 71 | Temp 97.7°F | Resp 17 | Wt 177.5 lb

## 2024-01-07 DIAGNOSIS — Z807 Family history of other malignant neoplasms of lymphoid, hematopoietic and related tissues: Secondary | ICD-10-CM | POA: Diagnosis not present

## 2024-01-07 DIAGNOSIS — C9000 Multiple myeloma not having achieved remission: Secondary | ICD-10-CM

## 2024-01-07 DIAGNOSIS — E119 Type 2 diabetes mellitus without complications: Secondary | ICD-10-CM | POA: Diagnosis not present

## 2024-01-07 DIAGNOSIS — D539 Nutritional anemia, unspecified: Secondary | ICD-10-CM | POA: Diagnosis not present

## 2024-01-07 DIAGNOSIS — G893 Neoplasm related pain (acute) (chronic): Secondary | ICD-10-CM | POA: Diagnosis not present

## 2024-01-07 DIAGNOSIS — Z5112 Encounter for antineoplastic immunotherapy: Secondary | ICD-10-CM | POA: Diagnosis not present

## 2024-01-07 DIAGNOSIS — Z5111 Encounter for antineoplastic chemotherapy: Secondary | ICD-10-CM | POA: Diagnosis not present

## 2024-01-07 LAB — BPAM RBC
Blood Product Expiration Date: 202509112359
Blood Product Unit Number: 202509172359
ISSUE DATE / TIME: 202508171936
PRODUCT CODE: 202508181325
PRODUCT CODE: 202509112359
Unit Type and Rh: 6200
Unit Type and Rh: 202509172359
Unit Type and Rh: 6200
Unit Type and Rh: 6200

## 2024-01-07 LAB — TYPE AND SCREEN
ABO/RH(D): A POS
Antibody Screen: NEGATIVE
Unit division: 0
Unit division: 0

## 2024-01-07 MED ORDER — PROCHLORPERAZINE MALEATE 10 MG PO TABS
10.0000 mg | ORAL_TABLET | Freq: Once | ORAL | Status: AC
  Administered 2024-01-07: 10 mg via ORAL
  Filled 2024-01-07: qty 1

## 2024-01-07 MED ORDER — BORTEZOMIB CHEMO SQ INJECTION 3.5 MG (2.5MG/ML)
1.3000 mg/m2 | Freq: Once | INTRAMUSCULAR | Status: AC
  Administered 2024-01-07: 2.5 mg via SUBCUTANEOUS
  Filled 2024-01-07: qty 1

## 2024-01-07 NOTE — Progress Notes (Signed)
 Patient declined to wait post observation via Velcade 

## 2024-01-07 NOTE — Patient Instructions (Signed)
 [facility] - A DEPT OF [facility]  Discharge Instructions: Thank you for choosing [facility] to provide your oncology and hematology care.  If you have a lab appointment with the Cancer Center, please go directly to the Cancer Center and check in at the registration area.  Wear comfortable clothing and clothing appropriate for easy access to any Portacath or PICC line.   We strive to give you quality time with your provider. You may need to reschedule your appointment if you arrive late (15 or more minutes).  Arriving late affects you and other patients whose appointments are after yours.  Also, if you miss three or more appointments without notifying the office, you may be dismissed from the clinic at the provider's discretion.      For prescription refill requests, have your pharmacy contact our office and allow 72 hours for refills to be completed.    Today you received the following chemotherapy and/or immunotherapy agents VELCADE       To help prevent nausea and vomiting after your treatment, we encourage you to take your nausea medication as directed.  BELOW ARE SYMPTOMS THAT SHOULD BE REPORTED IMMEDIATELY: *FEVER GREATER THAN 100.4 F (38 C) OR HIGHER *CHILLS OR SWEATING *NAUSEA AND VOMITING THAT IS NOT CONTROLLED WITH YOUR NAUSEA MEDICATION *UNUSUAL SHORTNESS OF BREATH *UNUSUAL BRUISING OR BLEEDING *URINARY PROBLEMS (pain or burning when urinating, or frequent urination) *BOWEL PROBLEMS (unusual diarrhea, constipation, pain near the anus) TENDERNESS IN MOUTH AND THROAT WITH OR WITHOUT PRESENCE OF ULCERS (sore throat, sores in mouth, or a toothache) UNUSUAL RASH, SWELLING OR PAIN  UNUSUAL VAGINAL DISCHARGE OR ITCHING   Items with * indicate a potential emergency and should be followed up as soon as possible or go to the Emergency Department if any problems should occur.  Please show the CHEMOTHERAPY ALERT CARD or IMMUNOTHERAPY  ALERT CARD at check-in to the Emergency Department and triage nurse.  Should you have questions after your visit or need to cancel or reschedule your appointment, please contact [facility] - A DEPT OF [facility]  [phone] and follow the prompts.  Office hours are 8:00 a.m. to 4:30 p.m. Monday - Friday. Please note that voicemails left after 4:00 p.m. may not be returned until the following business day.  We are closed weekends and major holidays. You have access to a nurse at all times for urgent questions. Please call the main number to the clinic [phone] and follow the prompts.  For any non-urgent questions, you may also contact your provider using MyChart. We now offer e-Visits for anyone 70 and older to request care online for non-urgent symptoms. For details visit [email or web address].   Also download the MyChart app! Go to the app store, search MyChart, open the app, select [place], and log in with your MyChart username and password.

## 2024-01-08 NOTE — Telephone Encounter (Signed)
 Oral Oncology Patient Advocate Encounter  Per [employer] representative medication is scheduled to be delivered today, 01/08/2024.  Maeson Purohit (Patty) Chet Burnet, CPhT  [facility], Zelda Salmon, [facility] Patient Advocate Specialist III Phone: [phone]  Fax: [phone]

## 2024-01-09 ENCOUNTER — Telehealth: Payer: Self-pay | Admitting: *Deleted

## 2024-01-09 ENCOUNTER — Encounter: Payer: Self-pay | Admitting: Oncology

## 2024-01-09 DIAGNOSIS — K080 Exfoliation of teeth due to systemic causes: Secondary | ICD-10-CM | POA: Diagnosis not present

## 2024-01-09 NOTE — Telephone Encounter (Signed)
 The patient called in and said she started to see some redness where the Velcade  has come shot and it has a little bit of weeping around the actual part where the needle went through.  She says it is not hot and it is not itchy.  I told her that I went over and talk to Nena and Isaiah which are the 2 people that are over the infusion.  Can have that but whenever she gets in there into the infusion area I told her to ask for Nena or Isaiah so that they can look at it and see what they need to do.  Patient says she is good with that

## 2024-01-09 NOTE — Telephone Encounter (Signed)
 Dr. Saxena at the dental office [phone] Dr. Babara wanted to have dental clearance for this patient before going through with Zom[facility] .  Dr. Saxena said in the message sent to us  that she has some cavities.  Would like a call back from Dr. Babara

## 2024-01-10 ENCOUNTER — Other Ambulatory Visit: Payer: Self-pay

## 2024-01-10 ENCOUNTER — Emergency Department

## 2024-01-10 ENCOUNTER — Emergency Department
Admission: EM | Admit: 2024-01-10 | Discharge: 2024-01-10 | Disposition: A | Discharge status: Home / Self Care | Attending: Emergency Medicine | Admitting: Emergency Medicine

## 2024-01-10 ENCOUNTER — Inpatient Hospital Stay

## 2024-01-10 ENCOUNTER — Inpatient Hospital Stay: Admitting: Oncology

## 2024-01-10 ENCOUNTER — Encounter: Payer: Self-pay | Admitting: Oncology

## 2024-01-10 VITALS — BP 111/58 | HR 44 | Resp 16

## 2024-01-10 VITALS — BP 118/46 | HR 44 | Temp 97.1°F | Resp 18 | Wt 177.7 lb

## 2024-01-10 DIAGNOSIS — G893 Neoplasm related pain (acute) (chronic): Secondary | ICD-10-CM | POA: Diagnosis not present

## 2024-01-10 DIAGNOSIS — R001 Bradycardia, unspecified: Secondary | ICD-10-CM | POA: Insufficient documentation

## 2024-01-10 DIAGNOSIS — C9000 Multiple myeloma not having achieved remission: Secondary | ICD-10-CM

## 2024-01-10 DIAGNOSIS — D539 Nutritional anemia, unspecified: Secondary | ICD-10-CM

## 2024-01-10 DIAGNOSIS — R918 Other nonspecific abnormal finding of lung field: Secondary | ICD-10-CM | POA: Diagnosis not present

## 2024-01-10 DIAGNOSIS — R7989 Other specified abnormal findings of blood chemistry: Secondary | ICD-10-CM

## 2024-01-10 DIAGNOSIS — Z5112 Encounter for antineoplastic immunotherapy: Secondary | ICD-10-CM | POA: Diagnosis not present

## 2024-01-10 DIAGNOSIS — Z8579 Personal history of other malignant neoplasms of lymphoid, hematopoietic and related tissues: Secondary | ICD-10-CM | POA: Insufficient documentation

## 2024-01-10 DIAGNOSIS — Z5111 Encounter for antineoplastic chemotherapy: Secondary | ICD-10-CM | POA: Diagnosis not present

## 2024-01-10 DIAGNOSIS — I493 Ventricular premature depolarization: Secondary | ICD-10-CM | POA: Insufficient documentation

## 2024-01-10 DIAGNOSIS — E119 Type 2 diabetes mellitus without complications: Secondary | ICD-10-CM | POA: Diagnosis not present

## 2024-01-10 DIAGNOSIS — I499 Cardiac arrhythmia, unspecified: Secondary | ICD-10-CM

## 2024-01-10 DIAGNOSIS — Z807 Family history of other malignant neoplasms of lymphoid, hematopoietic and related tissues: Secondary | ICD-10-CM | POA: Diagnosis not present

## 2024-01-10 LAB — CBC
HCT: 29.3 % — ABNORMAL LOW (ref 36.0–46.0)
Hemoglobin: 9.3 g/dL — ABNORMAL LOW (ref 12.0–15.0)
MCH: 32.5 pg (ref 26.0–34.0)
MCHC: 31.7 g/dL (ref 30.0–36.0)
MCV: 102.4 fL — ABNORMAL HIGH (ref 80.0–100.0)
Platelets: 101 K/uL — ABNORMAL LOW (ref 150–400)
RBC: 2.86 MIL/uL — ABNORMAL LOW (ref 3.87–5.11)
RDW: 16.4 % — ABNORMAL HIGH (ref 11.5–15.5)
WBC: 5.2 K/uL (ref 4.0–10.5)
nRBC: 0 % (ref 0.0–0.2)

## 2024-01-10 LAB — CBC WITH DIFFERENTIAL (CANCER CENTER ONLY)
Abs Immature Granulocytes: 0.03 K/uL (ref 0.00–0.07)
Basophils Absolute: 0 K/uL (ref 0.0–0.1)
Basophils Relative: 0 %
Eosinophils Absolute: 0 K/uL (ref 0.0–0.5)
Eosinophils Relative: 1 %
HCT: 29.9 % — ABNORMAL LOW (ref 36.0–46.0)
Hemoglobin: 9.6 g/dL — ABNORMAL LOW (ref 12.0–15.0)
Immature Granulocytes: 1 %
Lymphocytes Relative: 17 %
Lymphs Abs: 0.7 K/uL (ref 0.7–4.0)
MCH: 33 pg (ref 26.0–34.0)
MCHC: 32.1 g/dL (ref 30.0–36.0)
MCV: 102.7 fL — ABNORMAL HIGH (ref 80.0–100.0)
Monocytes Absolute: 0.5 K/uL (ref 0.1–1.0)
Monocytes Relative: 11 %
Neutro Abs: 3.1 K/uL (ref 1.7–7.7)
Neutrophils Relative %: 70 %
Platelet Count: 102 K/uL — ABNORMAL LOW (ref 150–400)
RBC: 2.91 MIL/uL — ABNORMAL LOW (ref 3.87–5.11)
RDW: 16.5 % — ABNORMAL HIGH (ref 11.5–15.5)
WBC Count: 4.3 K/uL (ref 4.0–10.5)
nRBC: 0 % (ref 0.0–0.2)

## 2024-01-10 LAB — CMP (CANCER CENTER ONLY)
ALT: 12 U/L (ref 0–44)
AST: 30 U/L (ref 15–41)
Albumin: 2.8 g/dL — ABNORMAL LOW (ref 3.5–5.0)
Alkaline Phosphatase: 54 U/L (ref 38–126)
Anion gap: 12 (ref 5–15)
BUN: 33 mg/dL — ABNORMAL HIGH (ref 8–23)
CO2: 22 mmol/L (ref 22–32)
Calcium: 8.1 mg/dL — ABNORMAL LOW (ref 8.9–10.3)
Chloride: 97 mmol/L — ABNORMAL LOW (ref 98–111)
Creatinine: 1.34 mg/dL — ABNORMAL HIGH (ref 0.44–1.00)
GFR, Estimated: 44 mL/min — ABNORMAL LOW (ref >60)
Glucose, Bld: 138 mg/dL — ABNORMAL HIGH (ref 70–99)
Potassium: 4.6 mmol/L (ref 3.5–5.1)
Sodium: 131 mmol/L — ABNORMAL LOW (ref 135–145)
Total Bilirubin: 0.5 mg/dL (ref 0.0–1.2)
Total Protein: 9.2 g/dL — ABNORMAL HIGH (ref 6.5–8.1)

## 2024-01-10 LAB — TROPONIN I (HIGH SENSITIVITY): Troponin I (High Sensitivity): 4 ng/L (ref <18)

## 2024-01-10 LAB — HEPATIC FUNCTION PANEL
ALT: 14 U/L (ref 0–44)
AST: 31 U/L (ref 15–41)
Albumin: 2.7 g/dL — ABNORMAL LOW (ref 3.5–5.0)
Alkaline Phosphatase: 55 U/L (ref 38–126)
Bilirubin, Direct: <0.1 mg/dL (ref 0.0–0.2)
Total Bilirubin: 0.2 mg/dL (ref 0.0–1.2)
Total Protein: 9 g/dL — ABNORMAL HIGH (ref 6.5–8.1)

## 2024-01-10 LAB — BASIC METABOLIC PANEL WITH GFR
Anion gap: 13 (ref 5–15)
BUN: 31 mg/dL — ABNORMAL HIGH (ref 8–23)
CO2: 22 mmol/L (ref 22–32)
Calcium: 7.8 mg/dL — ABNORMAL LOW (ref 8.9–10.3)
Chloride: 98 mmol/L (ref 98–111)
Creatinine, Ser: 1.13 mg/dL — ABNORMAL HIGH (ref 0.44–1.00)
GFR, Estimated: 54 mL/min — ABNORMAL LOW (ref >60)
Glucose, Bld: 158 mg/dL — ABNORMAL HIGH (ref 70–99)
Potassium: 5 mmol/L (ref 3.5–5.1)
Sodium: 133 mmol/L — ABNORMAL LOW (ref 135–145)

## 2024-01-10 LAB — MAGNESIUM: Magnesium: 2.9 mg/dL — ABNORMAL HIGH (ref 1.7–2.4)

## 2024-01-10 MED ORDER — DIPHENHYDRAMINE HCL 25 MG PO CAPS
50.0000 mg | ORAL_CAPSULE | Freq: Once | ORAL | Status: AC
  Administered 2024-01-10: 50 mg via ORAL
  Filled 2024-01-10: qty 2

## 2024-01-10 MED ORDER — DEXAMETHASONE 4 MG PO TABS
20.0000 mg | ORAL_TABLET | Freq: Once | ORAL | Status: AC
  Administered 2024-01-10: 20 mg via ORAL
  Filled 2024-01-10: qty 5

## 2024-01-10 MED ORDER — DARATUMUMAB-HYALURONIDASE-FIHJ 1800-30000 MG-UT/15ML ~~LOC~~ SOLN
1800.0000 mg | Freq: Once | SUBCUTANEOUS | Status: AC
  Administered 2024-01-10: 1800 mg via SUBCUTANEOUS
  Filled 2024-01-10: qty 15

## 2024-01-10 MED ORDER — SODIUM CHLORIDE 0.9 % IV SOLN
Freq: Once | INTRAVENOUS | Status: AC
  Filled 2024-01-10: qty 250

## 2024-01-10 MED ORDER — BORTEZOMIB CHEMO SQ INJECTION 3.5 MG (2.5MG/ML)
1.3000 mg/m2 | Freq: Once | INTRAMUSCULAR | Status: AC
  Administered 2024-01-10: 2.5 mg via SUBCUTANEOUS
  Filled 2024-01-10: qty 1

## 2024-01-10 MED ORDER — MONTELUKAST SODIUM 10 MG PO TABS
10.0000 mg | ORAL_TABLET | Freq: Once | ORAL | Status: AC
  Administered 2024-01-10: 10 mg via ORAL
  Filled 2024-01-10: qty 1

## 2024-01-10 MED ORDER — CALCIUM GLUCONATE-NACL 1-0.675 GM/50ML-% IV SOLN: 1.0000 g | Freq: Once | INTRAVENOUS | Status: DC

## 2024-01-10 MED ORDER — ACETAMINOPHEN 325 MG PO TABS
650.0000 mg | ORAL_TABLET | Freq: Once | ORAL | Status: AC
  Administered 2024-01-10: 650 mg via ORAL
  Filled 2024-01-10: qty 2

## 2024-01-10 MED ORDER — MORPHINE SULFATE ER 15 MG PO TBCR: 15.0000 mg | EXTENDED_RELEASE_TABLET | Freq: Three times a day (TID) | ORAL | 0 refills | Status: DC

## 2024-01-10 NOTE — ED Notes (Signed)
 ED MD aware of ventricular bigeminy cardiac rhythm

## 2024-01-10 NOTE — ED Notes (Signed)
 CCMD called to place pt on monitor

## 2024-01-10 NOTE — Assessment & Plan Note (Signed)
 Possibly due to dehydration IVF 1L NS today

## 2024-01-10 NOTE — Progress Notes (Signed)
 [facility] Psychosocial Distress Screening Clinical Social Work  Dana Wallace is a 68 y.o. year old female. Clinical Social Work was referred by nurse navigator for positive distress screening. The patient scored a 5 on the Psychosocial Distress Thermometer which indicates moderate distress. Clinical Social Worker met with patient to assess for distress and other psychosocial needs.     Distress Screen:    12/30/2023   12:59 PM  ONCBCN DISTRESS SCREENING  Screening Type Initial Screening  How much distress have you been experiencing in the past week? (0-10) 5  Practical concerns type Taking care of myself;Taking care of others  Social concerns type Relationship with spouse or partner;Relationship with children  Emotional concerns type Worry or anxiety;Sadness or depression;Grief or loss;Fear;Feelings of worthlessness or being a burden  Physical Concerns Type  Pain;Sleep;Fatigue;Loss or change of physical abilities     Interventions: Provided brief mental health counseling with regard to adjusting to her illness.   Patient's husband, Dana Wallace, was also present.  Patient retired from [employer] as a Therapist, music.  They have two adult children who are supportive.  Patient reports her distress screen number has declined since starting her treatment.  CSW and patient discussed common feeling and emotions when being diagnosed with cancer, and the importance of support during treatment.  CSW informed patient of the support team and support services at [facility].  CSW provided contact information and encouraged patient to call with any questions or concerns.   Follow Up Plan: CSW will follow-up with patient by phone  Patient verbalizes understanding of plan: Yes    Dana HERO Bosco Paparella, LCSW

## 2024-01-10 NOTE — Patient Instructions (Signed)

## 2024-01-10 NOTE — Assessment & Plan Note (Signed)
 Bone marrow biopsy results, PET scan findings, lab results were reviewed and discussed with patient. Diagnosis of IgA kappa multiple myeloma was reviewed and discussed.  Lab Results  Component Value Date   MPROTEIN 4.7 (H) 01/03/2024   KPAFRELGTCHN 1,540.0 (H) 01/03/2024   LAMBDASER 3.1 (L) 01/03/2024   KAPLAMBRATIO 496.77 (H) 01/03/2024    Normal Cytogenetics and multiple myeloma FISH testing results are pending. Patient is a transplant candidate.   Labs are reviewed and discussed with patient. Proceed with Dara VD.  Hold off starting Revlimid  due to decrease kidney function.   Thrombosis prophylaxis, aspirin 81 mg daily. Shingle prophylaxis: acyclovir 

## 2024-01-10 NOTE — ED Provider Notes (Signed)
 [facility] Provider Note    Event Date/Time   First MD Initiated Contact with Patient 01/10/24 1550     (approximate)   History   Abnormal ECG   HPI  Dana Wallace is a 68 y.o. female with a history of multiple myeloma who presents with concern for bradycardia.  The patient states that she was at the cancer center today receiving an infusion when she was noted to have bradycardia to the 30s to 40s, and an abnormal heart rhythm.  The patient denies feeling dizzy or lightheaded.  She has no chest pain or shortness of breath.  She denies other acute symptoms.  I reviewed the past medical records.  The patient was seen by Dr. Babara from oncology today.  She subsequently had a chemotherapy treatment and IV fluids but developed the bradycardia and irregular rhythm at that time.  Physical Exam   Triage Vital Signs: ED Triage Vitals  Encounter Vitals Group     BP 01/10/24 1236 (!) 146/51     Girls Systolic BP Percentile --      Girls Diastolic BP Percentile --      Boys Systolic BP Percentile --      Boys Diastolic BP Percentile --      Pulse Rate 01/10/24 1236 79     Resp 01/10/24 1236 18     Temp 01/10/24 1236 98.2 F (36.8 C)     Temp src --      SpO2 01/10/24 1236 97 %     Weight 01/10/24 1234 177 lb (80.3 kg)     Height 01/10/24 1234 5' (1.524 m)     Head Circumference --      Peak Flow --      Pain Score 01/10/24 1234 0     Pain Loc --      Pain Education --      Exclude from Growth Chart --     Most recent vital signs: Vitals:   01/10/24 1833 01/10/24 1833  BP: (!) 111/48   Pulse: 88   Resp: (!) 21   Temp:  98.2 F (36.8 C)  SpO2: 97%      General: Alert, comfortable appearing, no distress.  CV:  Good peripheral perfusion.  Bradycardic, irregular rhythm. Resp:  Normal effort.  Abd:  No distention.  Other:  No peripheral edema.   ED Results / Procedures / Treatments   Labs (all labs ordered are listed, but only abnormal  results are displayed) Labs Reviewed  BASIC METABOLIC PANEL WITH GFR - Abnormal; Notable for the following components:      Result Value   Sodium 133 (*)    Glucose, Bld 158 (*)    BUN 31 (*)    Creatinine, Ser 1.13 (*)    Calcium  7.8 (*)    GFR, Estimated 54 (*)    All other components within normal limits  CBC - Abnormal; Notable for the following components:   RBC 2.86 (*)    Hemoglobin 9.3 (*)    HCT 29.3 (*)    MCV 102.4 (*)    RDW 16.4 (*)    Platelets 101 (*)    All other components within normal limits  MAGNESIUM - Abnormal; Notable for the following components:   Magnesium 2.9 (*)    All other components within normal limits  HEPATIC FUNCTION PANEL - Abnormal; Notable for the following components:   Total Protein 9.0 (*)    Albumin 2.7 (*)  All other components within normal limits  TROPONIN I (HIGH SENSITIVITY)     EKG  ED ECG REPORT I, Waylon Cassis, the attending physician, personally viewed and interpreted this ECG.  Date: 01/10/2024 EKG Time: 1236 Rate: 93 Rhythm: Sinus rhythm with frequent PVCs and PACs QRS Axis: normal Intervals: normal ST/T Wave abnormalities: normal Narrative Interpretation: no evidence of acute ischemia    RADIOLOGY  Chest x-ray: I independently viewed and interpreted the images; there is a known left upper lobe mass with no focal consolidation or edema.  PROCEDURES:  Critical Care performed: No  Procedures   MEDICATIONS ORDERED IN ED: Medications - No data to display   IMPRESSION / MDM / ASSESSMENT AND PLAN / ED COURSE  I reviewed the triage vital signs and the nursing notes.  68 year old female with PMH as noted above presents due to concern for bradycardia today while receiving an infusion at the cancer center.  EKGs from the cancer center and from here showing frequent PVCs, often bigeminy, an ostensible rate in the 80s and 90s, but the patient's palpable pulse is slow.  Differential diagnosis includes,  but is not limited to, bigeminy, other dysrhythmia, electrolyte abnormality, other metabolic cause, medication reaction.  We will obtain lab workup, cardiac enzymes, and reassess.  Patient's presentation is most consistent with acute presentation with potential threat to life or bodily function.  The patient is on the cardiac monitor to evaluate for evidence of arrhythmia and/or significant heart rate changes.  ----------------------------------------- 6:33 PM on 01/10/2024 -----------------------------------------  Calcium  is 7.8 although added on LFTs.  The corrected calcium  is 8.8.  The patient was recently treated for hypercalcemia.  The patient remains asymptomatic.  Her blood pressure is stable.  She has no orthostatic symptoms, lightheadedness, chest pressure, or other acute symptoms.  Magnesium and other electrolytes are unremarkable.  Troponin is negative.  I consulted Dr. Barbaraann from cardiology and discussed the case with him.  He advises that if I felt that the patient was clinically stable, based on her symptoms, EKG findings, the results of the workup, he felt that she would be appropriate for outpatient workup.  The patient herself feels well and would like to go home.  Given that her blood pressure is stable, she has no symptoms related to bradycardia, and her palpable pulse rate is now in the 80s, she is appropriate for discharge.  I gave her strict return precautions and she expressed understanding.  Dr. Babara has already ordered outpatient cardiology referral.   FINAL CLINICAL IMPRESSION(S) / ED DIAGNOSES   Final diagnoses:  Frequent PVCs  Asymptomatic bradycardia     Rx / DC Orders   ED Discharge Orders     None        Note:  This document was prepared using Dragon voice recognition software and may include unintentional dictation errors.    Cassis Waylon, MD 01/10/24 2330

## 2024-01-10 NOTE — Assessment & Plan Note (Signed)
 Continue Norco 5/325mg  Q4-6hour PRN.  Recommend to increase MS Contin  15 mg twice daily to every 8 hours.

## 2024-01-10 NOTE — Progress Notes (Signed)
 Hematology/Oncology Progress note [phone] Fax:([phone]) [phone]       CHIEF COMPLAINTS/PURPOSE OF CONSULTATION:  Multiple myeloma  ASSESSMENT & PLAN:   Multiple myeloma (HCC) Bone marrow biopsy results, PET scan findings, lab results were reviewed and discussed with patient. Diagnosis of IgA kappa multiple myeloma was reviewed and discussed.  Lab Results  Component Value Date   MPROTEIN 4.7 (H) 01/03/2024   KPAFRELGTCHN 1,540.0 (H) 01/03/2024   LAMBDASER 3.1 (L) 01/03/2024   KAPLAMBRATIO 496.77 (H) 01/03/2024    Normal Cytogenetics and multiple myeloma FISH testing results are pending. Patient is a transplant candidate.   Labs are reviewed and discussed with patient. Proceed with Dara VD.  Hold off starting Revlimid  due to decrease kidney function.   Thrombosis prophylaxis, aspirin 81 mg daily. 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 Recommend Zometa  4 mg x 1 -emergent use. dental evaluation for future Zometa  use.  Macrocytic anemia Secondary to multiple myeloma.  Monitor closely.  Neoplasm related pain Continue Norco 5/325mg  Q4-6hour PRN.  Recommend to increase MS Contin  15 mg twice daily to every 8 hours.   Elevated serum creatinine Possibly due to dehydration IVF 1L NS today  Bradycardia Irregular heart beats. -refer to Cardiology EKG showed premature beats, wide QRS complex, ST depression.  Recommend patient to ER for further evaluation.    Orders Placed This Encounter  Procedures   Basic Metabolic Panel - Cancer Center Only    Standing Status:   Future    Expected Date:   01/14/2024    Expiration Date:   01/13/2025   Multiple Myeloma Panel (SPEP&IFE w/QIG)    Standing Status:   Future    Expected Date:   01/24/2024    Expiration Date:   01/23/2025   Kappa/lambda light chains    Standing Status:   Future    Expected Date:   01/24/2024    Expiration Date:   01/23/2025   Kappa/lambda light chains     Standing Status:   Future    Expected Date:   01/24/2024    Expiration Date:   01/23/2025   Multiple Myeloma Panel (SPEP&IFE w/QIG)    Standing Status:   Future    Expected Date:   01/24/2024    Expiration Date:   01/23/2025   CBC with Differential (Cancer Center Only)    Standing Status:   Future    Expected Date:   01/24/2024    Expiration Date:   01/23/2025   CMP (Cancer Center only)    Standing Status:   Future    Expected Date:   01/24/2024    Expiration Date:   01/23/2025   CBC with Differential (Cancer Center Only)    Standing Status:   Future    Expected Date:   01/31/2024    Expiration Date:   01/30/2025   CMP (Cancer Center only)    Standing Status:   Future    Expected Date:   01/31/2024    Expiration Date:   01/30/2025   CBC with Differential (Cancer Center Only)    Standing Status:   Future    Expected Date:   02/07/2024    Expiration Date:   02/06/2025   Ambulatory referral to Cardiology    Referral Priority:   Routine    Referral Type:   Consultation    Referral Reason:   Specialty Services Required    Referred to Provider:   Perla Evalene PARAS, MD    Number of Visits Requested:  1   Follow-up per LOS All questions were answered. The patient knows to call the clinic with any problems, questions or concerns.  Zelphia Cap, MD, PhD [facility] Hematology Oncology 01/10/2024    HISTORY OF PRESENTING ILLNESS:  Dana Wallace 68 y.o. female presents to establish care for metastatic bone lesions.   Oncology History  Multiple myeloma (HCC)  12/09/2023 Initial Diagnosis   Multiple myeloma (HCC)  She initially experienced chest discomfort starting in her left shoulder in February, which persisted and led her to seek medical attention in April. Despite a comprehensive workup including a chest x-ray, EKG, echocardiogram, mammogram, and a DEXA scan, all results were normal.  In May, she had two follow-up visits with her primary care physician. The discomfort was primarily located behind  her left breast and later moved to her right clavicle and sternum area, becoming very painful and tender to touch. An x-ray at [facility] was performed, and the patient was told there was some type of lesion, after which she underwent an MRI of the right clavicle, shoulder, and scapula. Interval marrow biopsy, lab workup and PET scan. She was found to have stage II IgA kappa multiple myeloma.   12/15/2023 Imaging   PET scan showed  1. Diffuse and extensive lytic bone lesions throughout the axial and appendicular skeleton most likely reflecting multiple myeloma. 2. Pathologic fracture involving the right clavicle. 3. Large soft tissue masses associated with the left second anterior rib and the lower sternum. 4. No findings for a primary neoplastic process involving the neck, chest, abdomen or pelvis   12/18/2023 Bone Marrow Biopsy   Pulmonary biopsy aspirate, clot, core Showed hypercellular bone marrow, involved by kappa restricted plasma cell neoplasm compromising 30-100%, average 70%, of the cellular marrow.   12/25/2023 Cancer Staging   Staging form: Plasma Cell Myeloma and Plasma Cell Disorders, AJCC 8th Edition - Clinical stage from 12/25/2023: Beta-2 -microglobulin (mg/L): 3.9, Albumin (g/dL): 3.2, ISS: Stage II, LDH: Normal - Signed by Cap Zelphia, MD on 12/25/2023 Stage prefix: Initial diagnosis Beta 2 microglobulin range (mg/L): 3.5 to 5.49 Albumin range (g/dL): Less than 3.5   1/84/7974 -  Chemotherapy   Patient is on Treatment Plan : MYELOMA NEWLY DIAGNOSED TRANSPLANT CANDIDATE DaraVRd (Daratumumab  SQ) q21d x 6 Cycles (Induction/Consolidation)       She has pain of upper chest and back, right shoulder, and the left hip.   Patient currently takes Norco as needed as well as MS Contin  15 mg Q8h  She takes about 5-6 Norco per 24 hours.  Pain control is better. Accompanied by husband.  MEDICAL HISTORY:  Past Medical History:  Diagnosis Date   Anxiety    Diabetes mellitus without  complication (HCC)    Hypertension     SURGICAL HISTORY: Past Surgical History:  Procedure Laterality Date   CESAREAN SECTION     CHOLECYSTECTOMY     IR BONE MARROW BIOPSY & ASPIRATION  12/18/2023   TUBAL LIGATION      SOCIAL HISTORY: Social History   Socioeconomic History   Marital status: Married    Spouse name: Not on file   Number of children: Not on file   Years of education: Not on file   Highest education level: Associate degree: academic program  Occupational History   Not on file  Tobacco Use   Smoking status: Never   Smokeless tobacco: Never  Substance and Sexual Activity   Alcohol use: No   Drug use: Never   Sexual activity: Not  Currently    Birth control/protection: None  Other Topics Concern   Not on file  Social History Narrative   Not on file   Social Drivers of Health   Financial Resource Strain: Low Risk  (08/29/2023)   Overall Financial Resource Strain (CARDIA)    Difficulty of Paying Living Expenses: Not hard at all  Food Insecurity: No Food Insecurity (12/09/2023)   Hunger Vital Sign    Worried About Running Out of Food in the Last Year: Never true    Ran Out of Food in the Last Year: Never true  Transportation Needs: No Transportation Needs (12/09/2023)   PRAPARE - Administrator, Civil Service (Medical): No    Lack of Transportation (Non-Medical): No  Physical Activity: Sufficiently Active (10/03/2023)   Exercise Vital Sign    Days of Exercise per Week: 6 days    Minutes of Exercise per Session: 30 min  Recent Concern: Physical Activity - Inactive (08/29/2023)   Exercise Vital Sign    Days of Exercise per Week: 0 days    Minutes of Exercise per Session: 30 min  Stress: Stress Concern Present (08/29/2023)   [employer] of Occupational Health - Occupational Stress Questionnaire    Feeling of Stress : To some extent  Social Connections: Moderately Isolated (08/29/2023)   Social Connection and Isolation Panel    Frequency of  Communication with Friends and Family: More than three times a week    Frequency of Social Gatherings with Friends and Family: Once a week    Attends Religious Services: Never    Database administrator or Organizations: No    Attends Engineer, structural: Not on file    Marital Status: Married  Catering manager Violence: Not At Risk (12/09/2023)   Humiliation, Afraid, Rape, and Kick questionnaire    Fear of Current or Ex-Partner: No    Emotionally Abused: No    Physically Abused: No    Sexually Abused: No    FAMILY HISTORY: Family History  Problem Relation Age of Onset   COPD Mother    Multiple sclerosis Mother    Varicose Veins Mother    Hodgkin's lymphoma Sister    Cancer Sister    Breast cancer Neg Hx     ALLERGIES:  has no known allergies.  MEDICATIONS:  Current Outpatient Medications  Medication Sig Dispense Refill   acyclovir  (ZOVIRAX ) 400 MG tablet Take 1 tablet (400 mg total) by mouth 2 (two) times daily. 60 tablet 3   albuterol  (VENTOLIN  HFA) 108 (90 Base) MCG/ACT inhaler Inhale 2 puffs into the lungs every 6 (six) hours as needed for wheezing or shortness of breath. 8 g 2   ALPRAZolam  (XANAX ) 0.5 MG tablet Take 0.5-1 tablets (0.25-0.5 mg total) by mouth 2 (two) times daily as needed for anxiety. 30 tablet 0   aspirin 81 MG tablet Take by mouth.     Blood Glucose Monitoring Suppl (ONE TOUCH ULTRA 2) w/Device KIT 1 each by Does not apply route daily at 2 PM. 1 kit 0   glipiZIDE  (GLUCOTROL  XL) 5 MG 24 hr tablet TAKE 1 TABLET(5 MG) BY MOUTH DAILY WITH BREAKFAST 90 tablet 1   glucose blood (ONETOUCH ULTRA) test strip 1 each by Other route daily. Use as instructed 100 each 12   hydrochlorothiazide  (MICROZIDE ) 12.5 MG capsule TAKE 1 CAPSULE(12.5 MG) BY MOUTH DAILY 90 capsule 0   HYDROcodone -acetaminophen  (NORCO/VICODIN) 5-325 MG tablet Take 1-2 tablets by mouth every 4 (four) hours as needed  for moderate pain (pain score 4-6). 60 tablet 0   latanoprost (XALATAN)  0.005 % ophthalmic solution 1 drop at bedtime.     lenalidomide  (REVLIMID ) 25 MG capsule Take 1 capsule (25 mg total) by mouth daily. Take for 7 days, then hold for 7 days. 7 capsule 0   lisinopril  (ZESTRIL ) 20 MG tablet TAKE 1 TABLET(20 MG) BY MOUTH DAILY 90 tablet 3   metFORMIN  (GLUCOPHAGE ) 1000 MG tablet Take 1 tablet (1,000 mg total) by mouth 2 (two) times daily with a meal. 180 tablet 3   montelukast  (SINGULAIR ) 10 MG tablet Take 1 tablet (10 mg total) by mouth daily as needed (wheezing). 10 tablet 0   Multiple Vitamin tablet      naloxone  (NARCAN ) nasal spray 4 mg/0.1 mL SPRAY 1 SPRAY INTO ONE NOSTRIL AS DIRECTED FOR OPIOID OVERDOSE (TURN PERSON ON SIDE AFTER DOSE. IF NO RESPONSE IN 2-3 MINUTES OR PERSON RESPONDS BUT RELAPSES, REPEAT USING A NEW SPRAY DEVICE AND SPRAY INTO THE OTHER NOSTRIL. CALL 911 AFTER USE.) * EMERGENCY USE ONLY * 1 each 0   Omega-3 Fatty Acids (FISH OIL) 1200 MG CPDR Take by mouth daily.     ondansetron  (ZOFRAN ) 8 MG tablet Take 1 tablet (8 mg total) by mouth every 8 (eight) hours as needed for nausea or vomiting. 30 tablet 1   simvastatin  (ZOCOR ) 10 MG tablet TAKE 1 TABLET(10 MG) BY MOUTH DAILY AT 6 PM 90 tablet 2   morphine  (MS CONTIN ) 15 MG 12 hr tablet Take 1 tablet (15 mg total) by mouth every 8 (eight) hours. 90 tablet 0   No current facility-administered medications for this visit.    Review of Systems  Constitutional:  Positive for fatigue. Negative for appetite change, chills and fever.  HENT:   Negative for hearing loss and voice change.   Eyes:  Negative for eye problems.  Respiratory:  Negative for chest tightness and cough.   Cardiovascular:  Negative for chest pain.  Gastrointestinal:  Negative for abdominal distention, abdominal pain and blood in stool.  Endocrine: Negative for hot flashes.  Genitourinary:  Negative for difficulty urinating and frequency.   Musculoskeletal:  Positive for arthralgias.       Shoulder, upper chest, hip pain  Skin:   Negative for itching and rash.  Neurological:  Negative for extremity weakness.  Hematological:  Negative for adenopathy.  Psychiatric/Behavioral:  Negative for confusion.      PHYSICAL EXAMINATION: ECOG PERFORMANCE STATUS: 1 - Symptomatic but completely ambulatory  Vitals:   01/10/24 0830  BP: (!) 118/46  Pulse: (!) 44  Resp: 18  Temp: (!) 97.1 F (36.2 C)  SpO2: 98%   Filed Weights   01/10/24 0830  Weight: 177 lb 11.2 oz (80.6 kg)    Physical Exam Constitutional:      General: She is not in acute distress.    Appearance: She is not diaphoretic.  HENT:     Head: Normocephalic and atraumatic.  Eyes:     General: No scleral icterus. Cardiovascular:     Rate and Rhythm: Normal rate. Rhythm irregular.     Heart sounds: No murmur heard. Pulmonary:     Effort: Pulmonary effort is normal. No respiratory distress.     Breath sounds: Normal breath sounds. No wheezing.  Abdominal:     General: Bowel sounds are normal. There is no distension.     Palpations: Abdomen is soft.     Tenderness: There is no abdominal tenderness.  Musculoskeletal:  General: Normal range of motion.     Cervical back: Normal range of motion and neck supple.  Skin:    General: Skin is warm and dry.     Findings: No erythema.  Neurological:     Mental Status: She is alert and oriented to person, place, and time. Mental status is at baseline.     Cranial Nerves: No cranial nerve deficit.     Motor: No abnormal muscle tone.     Coordination: Coordination normal.  Psychiatric:        Mood and Affect: Mood and affect normal.      LABORATORY DATA:  I have reviewed the data as listed    Latest Ref Rng & Units 01/10/2024   12:36 PM 01/10/2024    8:03 AM 01/03/2024    8:48 AM  CBC  WBC 4.0 - 10.5 K/uL 5.2  4.3  5.0   Hemoglobin 12.0 - 15.0 g/dL 9.3  9.6  8.4   Hematocrit 36.0 - 46.0 % 29.3  29.9  26.1   Platelets 150 - 400 K/uL 101  102  145       Latest Ref Rng & Units 01/10/2024    12:36 PM 01/10/2024    8:03 AM 01/03/2024    8:48 AM  CMP  Glucose 70 - 99 mg/dL 841  861  878   BUN 8 - 23 mg/dL 31  33  15   Creatinine 0.44 - 1.00 mg/dL 8.86  8.65  9.33   Sodium 135 - 145 mmol/L 133  131  136   Potassium 3.5 - 5.1 mmol/L 5.0  4.6  4.2   Chloride 98 - 111 mmol/L 98  97  94   CO2 22 - 32 mmol/L 22  22  28    Calcium  8.9 - 10.3 mg/dL 7.8  8.1  89.1   Total Protein 6.5 - 8.1 g/dL  9.2  9.8   Total Bilirubin 0.0 - 1.2 mg/dL  0.5  0.7   Alkaline Phos 38 - 126 U/L  54  57   AST 15 - 41 U/L  30  19   ALT 0 - 44 U/L  12  13      RADIOGRAPHIC STUDIES: I have personally reviewed the radiological images as listed and agreed with the findings in the report. DG Chest 2 View Result Date: 01/10/2024 CLINICAL DATA:  Bradycardia. EXAM: CHEST - 2 VIEW COMPARISON:  PET scan of December 13, 2023. Radiograph of August 30, 2023. FINDINGS: Stable cardiomediastinal silhouette. Pleural based mass is noted in left upper lobe laterally consistent with multiple myeloma as noted on prior PET scan. No acute consolidative process is noted. IMPRESSION: Pleural based mass is noted in left upper lobe laterally consistent with multiple myeloma as noted on prior PET scan. Electronically Signed   By: Lynwood Landy Raddle M.D.   On: 01/10/2024 14:29   IR BONE MARROW BIOPSY & ASPIRATION Result Date: 12/18/2023 INDICATION: MACROCYTIC ANEMIA, CONCERN FOR MYELOMA EXAM: FLUOROSCOPIC GUIDED LEFT ILIAC BONE MARROW ASPIRATION AND CORE BIOPSY Date:  12/18/2023 12/18/2023 9:02 am Radiologist:  M. Frederic Specking, MD Guidance:  Fluoroscopy FLUOROSCOPY: Fluoroscopy Time: 3 minutes 30 seconds (262 mGy). MEDICATIONS: 1% lidocaine  local ANESTHESIA/SEDATION: 2.0 mg IV Versed ; 100 mcg IV Fentanyl  Moderate Sedation Time:  20 minutes The patient was continuously monitored during the procedure by the interventional radiology nurse under my direct supervision. CONTRAST:  None. COMPLICATIONS: None PROCEDURE: Informed consent was obtained from the  patient following explanation of the procedure,  risks, benefits and alternatives. The patient understands, agrees and consents for the procedure. All questions were addressed. A time out was performed. The patient was positioned prone and fluoroscopic localization was performed of the pelvis to demonstrate the iliac marrow spaces. Maximal barrier sterile technique utilized including caps, mask, sterile gowns, sterile gloves, large sterile drape, hand hygiene, and Betadine prep. Under sterile conditions and local anesthesia, an 11 gauge coaxial bone biopsy needle was advanced into the left iliac marrow space. Needle position was confirmed with fluoroscopic imaging. Initially, bone marrow aspiration was performed. Next, the 11 gauge outer cannula was utilized to obtain a left iliac bone marrow core biopsy. Needle was removed. Hemostasis was obtained with compression. The patient tolerated the procedure well. Samples were prepared with the cytotechnologist. No immediate complications. IMPRESSION: Fluoroscopic guided left iliac bone marrow aspiration and core biopsy. Electronically Signed   By: CHRISTELLA.  Shick M.D.   On: 12/18/2023 09:16   NM PET Image Initial (PI) Skull Base To Thigh (F-18 FDG) Result Date: 12/15/2023 CLINICAL DATA:  Initial treatment strategy for lytic bone lesions. EXAM: NUCLEAR MEDICINE PET SKULL BASE TO THIGH TECHNIQUE: 9.93 mCi F-18 FDG was injected intravenously. Full-ring PET imaging was performed from the skull base to thigh after the radiotracer. CT data was obtained and used for attenuation correction and anatomic localization. Fasting blood glucose: 67 mg/dl COMPARISON:  Outside MRI examination 12/06/2023 FINDINGS: Mediastinal blood pool activity: SUV max 1.8 Liver activity: SUV max NA NECK: No hypermetabolic lymph nodes in the neck. Incidental CT findings: None. CHEST: No hypermetabolic mediastinal or hilar nodes. No suspicious pulmonary nodules on the CT scan. Incidental CT findings: None.  ABDOMEN/PELVIS: No abnormal hypermetabolic activity within the liver, pancreas, adrenal glands, or spleen. No hypermetabolic lymph nodes in the abdomen or pelvis. Incidental CT findings: Status post cholecystectomy. No biliary dilatation. Minimal scattered aortic calcifications. No aneurysm. Simple left renal cyst not requiring any further imaging evaluation or follow-up. Small amount of free pelvic fluid noted. SKELETON: Diffuse and extensive lytic bone lesions throughout the axial and appendicular skeleton most likely reflecting multiple myeloma as there are no findings for a primary neoplastic process involving the neck, chest, abdomen pelvis. Pathologic fracture involving the right clavicle. The clavicle as a mod the in appearance with SUV max of 6.9. The second left anterior rib is completely destroyed and has an associated large soft tissue mass. SUV max is 5.6. Associated small adjacent axillary lymph node has an SUV max of 4.1. The lower sternum is completely destroyed with associated soft tissue mass. SUV max is 6.2. Linear band of hypermetabolism in the right gluteus medius muscle is likely traumatic or inflammatory. There is also a focus of hypermetabolism in the left gluteus maximus muscle which is indeterminate. Incidental CT findings: Numerous skeletal lytic lesions but no soft tissue mass causing spinal canal compromise. There are several calcified disc protrusions noted in the thoracic and spines. IMPRESSION: 1. Diffuse and extensive lytic bone lesions throughout the axial and appendicular skeleton most likely reflecting multiple myeloma. 2. Pathologic fracture involving the right clavicle. 3. Large soft tissue masses associated with the left second anterior rib and the lower sternum. 4. No findings for a primary neoplastic process involving the neck, chest, abdomen or pelvis. Electronically Signed   By: MYRTIS Stammer M.D.   On: 12/15/2023 15:44

## 2024-01-10 NOTE — ED Triage Notes (Signed)
 Pt comes with abnormal EKG with bradycardia and low BP. Pt was at cancer center and this happened so they sent her here.   Pt states no cp, sob or dizziness. Pt just started treatments last Friday. Pt had unit of blood Monday and fluids today .

## 2024-01-10 NOTE — Assessment & Plan Note (Signed)
 Secondary to multiple myeloma.  Monitor closely.

## 2024-01-10 NOTE — Progress Notes (Signed)
 Patient received chemotherapy treatment and IV fluids in clinic as ordered. Patient tolerated well. Post vital signs showed bradycardia and irregular heart rhythm. EKG ordered by Dr. Babara. EKG abnormal. Per Dr. Babara, patient to go to ED for further evaluation. Patient and her husband verbalized understanding.

## 2024-01-10 NOTE — Assessment & Plan Note (Signed)
 Irregular heart beats. -refer to Cardiology EKG showed premature beats, wide QRS complex, ST depression.  Recommend patient to ER for further evaluation.

## 2024-01-10 NOTE — Assessment & Plan Note (Signed)
 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 Recommend Zometa  4 mg x 1 -emergent use. dental evaluation for future Zometa  use.

## 2024-01-10 NOTE — Discharge Instructions (Signed)
 Continue taking your normal medications as prescribed.  Dr. Babara has actually already put in a referral order for cardiology, so the allergy office should contact you for a follow-up appointment.  Return to the ER immediately for new, worsening, or persistent severe symptoms including a heart rate in the 30s, dizziness or lightheadedness, feeling weak or lightheaded when you stand up, shortness of breath, palpitations, chest pain or pressure, or any other new or worsening symptoms that concern you.

## 2024-01-13 ENCOUNTER — Encounter: Payer: Self-pay | Admitting: Oncology

## 2024-01-14 ENCOUNTER — Other Ambulatory Visit: Payer: Self-pay | Admitting: Oncology

## 2024-01-14 ENCOUNTER — Encounter: Payer: Self-pay | Admitting: Oncology

## 2024-01-14 ENCOUNTER — Inpatient Hospital Stay

## 2024-01-14 ENCOUNTER — Encounter: Payer: Self-pay | Admitting: Pharmacist

## 2024-01-14 VITALS — BP 124/76 | HR 76 | Temp 96.7°F | Resp 20 | Ht 60.0 in | Wt 175.5 lb

## 2024-01-14 DIAGNOSIS — C9000 Multiple myeloma not having achieved remission: Secondary | ICD-10-CM

## 2024-01-14 DIAGNOSIS — Z5112 Encounter for antineoplastic immunotherapy: Secondary | ICD-10-CM | POA: Diagnosis not present

## 2024-01-14 DIAGNOSIS — E119 Type 2 diabetes mellitus without complications: Secondary | ICD-10-CM | POA: Diagnosis not present

## 2024-01-14 DIAGNOSIS — Z5111 Encounter for antineoplastic chemotherapy: Secondary | ICD-10-CM | POA: Diagnosis not present

## 2024-01-14 DIAGNOSIS — D539 Nutritional anemia, unspecified: Secondary | ICD-10-CM | POA: Diagnosis not present

## 2024-01-14 DIAGNOSIS — G893 Neoplasm related pain (acute) (chronic): Secondary | ICD-10-CM | POA: Diagnosis not present

## 2024-01-14 DIAGNOSIS — Z807 Family history of other malignant neoplasms of lymphoid, hematopoietic and related tissues: Secondary | ICD-10-CM | POA: Diagnosis not present

## 2024-01-14 LAB — CBC WITH DIFFERENTIAL (CANCER CENTER ONLY)
Abs Immature Granulocytes: 0.01 K/uL (ref 0.00–0.07)
Basophils Absolute: 0 K/uL (ref 0.0–0.1)
Basophils Relative: 0 %
Eosinophils Absolute: 0 K/uL (ref 0.0–0.5)
Eosinophils Relative: 0 %
HCT: 28.2 % — ABNORMAL LOW (ref 36.0–46.0)
Hemoglobin: 9.2 g/dL — ABNORMAL LOW (ref 12.0–15.0)
Immature Granulocytes: 0 %
Lymphocytes Relative: 19 %
Lymphs Abs: 0.6 K/uL — ABNORMAL LOW (ref 0.7–4.0)
MCH: 33.2 pg (ref 26.0–34.0)
MCHC: 32.6 g/dL (ref 30.0–36.0)
MCV: 101.8 fL — ABNORMAL HIGH (ref 80.0–100.0)
Monocytes Absolute: 0.5 K/uL (ref 0.1–1.0)
Monocytes Relative: 14 %
Neutro Abs: 2.3 K/uL (ref 1.7–7.7)
Neutrophils Relative %: 67 %
Platelet Count: 69 K/uL — ABNORMAL LOW (ref 150–400)
RBC: 2.77 MIL/uL — ABNORMAL LOW (ref 3.87–5.11)
RDW: 15.9 % — ABNORMAL HIGH (ref 11.5–15.5)
WBC Count: 3.4 K/uL — ABNORMAL LOW (ref 4.0–10.5)
nRBC: 0 % (ref 0.0–0.2)

## 2024-01-14 LAB — BASIC METABOLIC PANEL - CANCER CENTER ONLY
Anion gap: 11 (ref 5–15)
BUN: 27 mg/dL — ABNORMAL HIGH (ref 8–23)
CO2: 27 mmol/L (ref 22–32)
Calcium: 8.9 mg/dL (ref 8.9–10.3)
Chloride: 98 mmol/L (ref 98–111)
Creatinine: 0.84 mg/dL (ref 0.44–1.00)
GFR, Estimated: >60 mL/min (ref >60)
Glucose, Bld: 119 mg/dL — ABNORMAL HIGH (ref 70–99)
Potassium: 5.4 mmol/L — ABNORMAL HIGH (ref 3.5–5.1)
Sodium: 136 mmol/L (ref 135–145)

## 2024-01-14 MED ORDER — DIPHENHYDRAMINE HCL 25 MG PO CAPS
50.0000 mg | ORAL_CAPSULE | Freq: Once | ORAL | Status: AC
  Administered 2024-01-14: 50 mg via ORAL
  Filled 2024-01-14: qty 2

## 2024-01-14 MED ORDER — BORTEZOMIB CHEMO SQ INJECTION 3.5 MG (2.5MG/ML)
1.3000 mg/m2 | Freq: Once | INTRAMUSCULAR | Status: AC
  Administered 2024-01-14: 2.5 mg via SUBCUTANEOUS
  Filled 2024-01-14: qty 1

## 2024-01-14 MED ORDER — PROCHLORPERAZINE MALEATE 10 MG PO TABS
10.0000 mg | ORAL_TABLET | Freq: Once | ORAL | Status: AC
  Administered 2024-01-14: 10 mg via ORAL
  Filled 2024-01-14: qty 1

## 2024-01-14 NOTE — Patient Instructions (Signed)
 [facility] - A DEPT OF [facility]  Discharge Instructions: Thank you for choosing [facility] to provide your oncology and hematology care.  If you have a lab appointment with the Cancer Center, please go directly to the Cancer Center and check in at the registration area.  Wear comfortable clothing and clothing appropriate for easy access to any Portacath or PICC line.   We strive to give you quality time with your provider. You may need to reschedule your appointment if you arrive late (15 or more minutes).  Arriving late affects you and other patients whose appointments are after yours.  Also, if you miss three or more appointments without notifying the office, you may be dismissed from the clinic at the provider's discretion.      For prescription refill requests, have your pharmacy contact our office and allow 72 hours for refills to be completed.    Today you received the following chemotherapy and/or immunotherapy agents VELCADE      To help prevent nausea and vomiting after your treatment, we encourage you to take your nausea medication as directed.  BELOW ARE SYMPTOMS THAT SHOULD BE REPORTED IMMEDIATELY: *FEVER GREATER THAN 100.4 F (38 C) OR HIGHER *CHILLS OR SWEATING *NAUSEA AND VOMITING THAT IS NOT CONTROLLED WITH YOUR NAUSEA MEDICATION *UNUSUAL SHORTNESS OF BREATH *UNUSUAL BRUISING OR BLEEDING *URINARY PROBLEMS (pain or burning when urinating, or frequent urination) *BOWEL PROBLEMS (unusual diarrhea, constipation, pain near the anus) TENDERNESS IN MOUTH AND THROAT WITH OR WITHOUT PRESENCE OF ULCERS (sore throat, sores in mouth, or a toothache) UNUSUAL RASH, SWELLING OR PAIN  UNUSUAL VAGINAL DISCHARGE OR ITCHING   Items with * indicate a potential emergency and should be followed up as soon as possible or go to the Emergency Department if any problems should occur.  Please show the CHEMOTHERAPY ALERT CARD or IMMUNOTHERAPY  ALERT CARD at check-in to the Emergency Department and triage nurse.  Should you have questions after your visit or need to cancel or reschedule your appointment, please contact [facility] - A DEPT OF [facility]  [phone] and follow the prompts.  Office hours are 8:00 a.m. to 4:30 p.m. Monday - Friday. Please note that voicemails left after 4:00 p.m. may not be returned until the following business day.  We are closed weekends and major holidays. You have access to a nurse at all times for urgent questions. Please call the main number to the clinic [phone] and follow the prompts.  For any non-urgent questions, you may also contact your provider using MyChart. We now offer e-Visits for anyone 65 and older to request care online for non-urgent symptoms. For details visit [email or web address].   Also download the MyChart app! Go to the app store, search "MyChart", open the app, select [place], and log in with your MyChart username and password.   Bortezomib Injection What is this medication? BORTEZOMIB (bor TEZ oh mib) treats lymphoma. It may also be used to treat multiple myeloma, a type of bone marrow cancer. It works by blocking a protein that causes cancer cells to grow and multiply. This helps to slow or stop the spread of cancer cells. This medicine may be used for other purposes; ask your health care provider or pharmacist if you have questions. COMMON BRAND NAME(S): BORUZU, Velcade What should I tell my care team before I take this medication? They need to know if you have any  of these conditions: Dehydration Diabetes Heart disease Liver disease Tingling of the fingers or toes or other nerve disorder An unusual or allergic reaction to bortezomib, other medications, foods, dyes, or preservatives If you or your partner are pregnant or trying to get pregnant Breastfeeding How should I use this medication? This medication is injected into  a vein or under the skin. It is given by your care team in a hospital or clinic setting. Talk to your care team about the use of this medication in children. Special care may be needed. Overdosage: If you think you have taken too much of this medicine contact a poison control center or emergency room at once. NOTE: This medicine is only for you. Do not share this medicine with others. What if I miss a dose? Keep appointments for follow-up doses. It is important not to miss your dose. Call your care team if you are unable to keep an appointment. What may interact with this medication? Ketoconazole Rifampin This list may not describe all possible interactions. Give your health care provider a list of all the medicines, herbs, non-prescription drugs, or dietary supplements you use. Also tell them if you smoke, drink alcohol, or use illegal drugs. Some items may interact with your medicine. What should I watch for while using this medication? Your condition will be monitored carefully while you are receiving this medication. You may need blood work while taking this medication. This medication may affect your coordination, reaction time, or judgment. Do not drive or operate machinery until you know how this medication affects you. Sit up or stand slowly to reduce the risk of dizzy or fainting spells. Drinking alcohol with this medication can increase the risk of these side effects. This medication may increase your risk of getting an infection. Call your care team for advice if you get a fever, chills, sore throat, or other symptoms of a cold or flu. Do not treat yourself. Try to avoid being around people who are sick. Check with your care team if you have severe diarrhea, nausea, and vomiting, or if you sweat a lot. The loss of too much body fluid may make it dangerous for you to take this medication. Talk to your care team if you may be pregnant. Serious birth defects can occur if you take this  medication during pregnancy and for 7 months after the last dose. You will need a negative pregnancy test before starting this medication. Contraception is recommended while taking this medication and for 7 months after the last dose. Your care team can help you find the option that works for you. If your partner can get pregnant, use a condom during sex while taking this medication and for 4 months after the last dose. Do not breastfeed while taking this medication and for 2 months after the last dose. This medication may cause infertility. Talk to your care team if you are concerned about your fertility. What side effects may I notice from receiving this medication? Side effects that you should report to your care team as soon as possible: Allergic reactions--skin rash, itching, hives, swelling of the face, lips, tongue, or throat Bleeding--bloody or black, tar-like stools, vomiting blood or brown material that looks like coffee grounds, red or dark brown urine, small red or purple spots on skin, unusual bruising or bleeding Bleeding in the brain--severe headache, stiff neck, confusion, dizziness, change in vision, numbness or weakness of the face, arm, or leg, trouble speaking, trouble walking, vomiting Bowel blockage--stomach cramping, unable  to have a bowel movement or pass gas, loss of appetite, vomiting Heart failure--shortness of breath, swelling of the ankles, feet, or hands, sudden weight gain, unusual weakness or fatigue Infection--fever, chills, cough, sore throat, wounds that don't heal, pain or trouble when passing urine, general feeling of discomfort or being unwell Liver injury--right upper belly pain, loss of appetite, nausea, light-colored stool, dark yellow or brown urine, yellowing skin or eyes, unusual weakness or fatigue Low blood pressure--dizziness, feeling faint or lightheaded, blurry vision Lung injury--shortness of breath or trouble breathing, cough, spitting up blood, chest  pain, fever Pain, tingling, or numbness in the hands or feet Severe or prolonged diarrhea Stomach pain, bloody diarrhea, pale skin, unusual weakness or fatigue, decrease in the amount of urine, which may be signs of hemolytic uremic syndrome Sudden and severe headache, confusion, change in vision, seizures, which may be signs of posterior reversible encephalopathy syndrome (PRES) TTP--purple spots on the skin or inside the mouth, pale skin, yellowing skin or eyes, unusual weakness or fatigue, fever, fast or irregular heartbeat, confusion, change in vision, trouble speaking, trouble walking Tumor lysis syndrome (TLS)--nausea, vomiting, diarrhea, decrease in the amount of urine, dark urine, unusual weakness or fatigue, confusion, muscle pain or cramps, fast or irregular heartbeat, joint pain Side effects that usually do not require medical attention (report to your care team if they continue or are bothersome): Constipation Diarrhea Fatigue Loss of appetite Nausea This list may not describe all possible side effects. Call your doctor for medical advice about side effects. You may report side effects to FDA at 1-800-FDA-1088. Where should I keep my medication? This medication is given in a hospital or clinic. It will not be stored at home. NOTE: This sheet is a summary. It may not cover all possible information. If you have questions about this medicine, talk to your doctor, pharmacist, or health care provider.  2024 Elsevier/Gold Standard (2021-10-10 00:00:00)

## 2024-01-16 ENCOUNTER — Other Ambulatory Visit: Payer: Self-pay

## 2024-01-16 DIAGNOSIS — C9000 Multiple myeloma not having achieved remission: Secondary | ICD-10-CM

## 2024-01-17 ENCOUNTER — Inpatient Hospital Stay: Admitting: Oncology

## 2024-01-17 ENCOUNTER — Other Ambulatory Visit: Payer: Self-pay

## 2024-01-17 ENCOUNTER — Inpatient Hospital Stay

## 2024-01-17 ENCOUNTER — Encounter: Payer: Self-pay | Admitting: Oncology

## 2024-01-17 ENCOUNTER — Telehealth: Payer: Self-pay | Admitting: *Deleted

## 2024-01-17 ENCOUNTER — Other Ambulatory Visit: Payer: Self-pay | Admitting: Pharmacist

## 2024-01-17 VITALS — BP 95/44 | Temp 99.1°F | Resp 18 | Ht 60.0 in | Wt 175.1 lb

## 2024-01-17 VITALS — BP 107/43 | HR 62

## 2024-01-17 DIAGNOSIS — Z5112 Encounter for antineoplastic immunotherapy: Secondary | ICD-10-CM | POA: Diagnosis not present

## 2024-01-17 DIAGNOSIS — D696 Thrombocytopenia, unspecified: Secondary | ICD-10-CM

## 2024-01-17 DIAGNOSIS — C9000 Multiple myeloma not having achieved remission: Secondary | ICD-10-CM

## 2024-01-17 DIAGNOSIS — I959 Hypotension, unspecified: Secondary | ICD-10-CM | POA: Insufficient documentation

## 2024-01-17 DIAGNOSIS — R001 Bradycardia, unspecified: Secondary | ICD-10-CM | POA: Diagnosis not present

## 2024-01-17 DIAGNOSIS — E119 Type 2 diabetes mellitus without complications: Secondary | ICD-10-CM | POA: Diagnosis not present

## 2024-01-17 DIAGNOSIS — M898X9 Other specified disorders of bone, unspecified site: Secondary | ICD-10-CM

## 2024-01-17 DIAGNOSIS — D539 Nutritional anemia, unspecified: Secondary | ICD-10-CM

## 2024-01-17 DIAGNOSIS — Z5111 Encounter for antineoplastic chemotherapy: Secondary | ICD-10-CM | POA: Diagnosis not present

## 2024-01-17 DIAGNOSIS — G893 Neoplasm related pain (acute) (chronic): Secondary | ICD-10-CM | POA: Diagnosis not present

## 2024-01-17 DIAGNOSIS — C7951 Secondary malignant neoplasm of bone: Secondary | ICD-10-CM

## 2024-01-17 DIAGNOSIS — Z807 Family history of other malignant neoplasms of lymphoid, hematopoietic and related tissues: Secondary | ICD-10-CM | POA: Diagnosis not present

## 2024-01-17 DIAGNOSIS — E861 Hypovolemia: Secondary | ICD-10-CM

## 2024-01-17 LAB — CBC WITH DIFFERENTIAL (CANCER CENTER ONLY)
Abs Immature Granulocytes: 0.01 K/uL (ref 0.00–0.07)
Basophils Absolute: 0 K/uL (ref 0.0–0.1)
Basophils Relative: 0 %
Eosinophils Absolute: 0.1 K/uL (ref 0.0–0.5)
Eosinophils Relative: 2 %
HCT: 29.1 % — ABNORMAL LOW (ref 36.0–46.0)
Hemoglobin: 9.3 g/dL — ABNORMAL LOW (ref 12.0–15.0)
Immature Granulocytes: 0 %
Lymphocytes Relative: 16 %
Lymphs Abs: 0.5 K/uL — ABNORMAL LOW (ref 0.7–4.0)
MCH: 32.9 pg (ref 26.0–34.0)
MCHC: 32 g/dL (ref 30.0–36.0)
MCV: 102.8 fL — ABNORMAL HIGH (ref 80.0–100.0)
Monocytes Absolute: 0.3 K/uL (ref 0.1–1.0)
Monocytes Relative: 9 %
Neutro Abs: 2.4 K/uL (ref 1.7–7.7)
Neutrophils Relative %: 73 %
Platelet Count: 66 K/uL — ABNORMAL LOW (ref 150–400)
RBC: 2.83 MIL/uL — ABNORMAL LOW (ref 3.87–5.11)
RDW: 15.7 % — ABNORMAL HIGH (ref 11.5–15.5)
WBC Count: 3.3 K/uL — ABNORMAL LOW (ref 4.0–10.5)
nRBC: 0 % (ref 0.0–0.2)

## 2024-01-17 LAB — CMP (CANCER CENTER ONLY)
ALT: 14 U/L (ref 0–44)
AST: 35 U/L (ref 15–41)
Albumin: 3.1 g/dL — ABNORMAL LOW (ref 3.5–5.0)
Alkaline Phosphatase: 79 U/L (ref 38–126)
Anion gap: 10 (ref 5–15)
BUN: 19 mg/dL (ref 8–23)
CO2: 26 mmol/L (ref 22–32)
Calcium: 8.7 mg/dL — ABNORMAL LOW (ref 8.9–10.3)
Chloride: 97 mmol/L — ABNORMAL LOW (ref 98–111)
Creatinine: 0.73 mg/dL (ref 0.44–1.00)
GFR, Estimated: >60 mL/min (ref >60)
Glucose, Bld: 122 mg/dL — ABNORMAL HIGH (ref 70–99)
Potassium: 4.9 mmol/L (ref 3.5–5.1)
Sodium: 133 mmol/L — ABNORMAL LOW (ref 135–145)
Total Bilirubin: 0.5 mg/dL (ref 0.0–1.2)
Total Protein: 8.1 g/dL (ref 6.5–8.1)

## 2024-01-17 MED ORDER — LENALIDOMIDE 25 MG PO CAPS: 25.0000 mg | ORAL_CAPSULE | Freq: Every day | ORAL | 0 refills | Status: DC

## 2024-01-17 MED ORDER — DARATUMUMAB-HYALURONIDASE-FIHJ 1800-30000 MG-UT/15ML ~~LOC~~ SOLN
1800.0000 mg | Freq: Once | SUBCUTANEOUS | Status: AC
  Administered 2024-01-17: 1800 mg via SUBCUTANEOUS
  Filled 2024-01-17: qty 15

## 2024-01-17 MED ORDER — ACETAMINOPHEN 325 MG PO TABS
650.0000 mg | ORAL_TABLET | Freq: Once | ORAL | Status: AC
Start: 2024-01-17 — End: 2024-01-17
  Administered 2024-01-17: 650 mg via ORAL
  Filled 2024-01-17: qty 2

## 2024-01-17 MED ORDER — DEXAMETHASONE 4 MG PO TABS
20.0000 mg | ORAL_TABLET | Freq: Once | ORAL | Status: AC
  Administered 2024-01-17: 20 mg via ORAL
  Filled 2024-01-17: qty 5

## 2024-01-17 MED ORDER — SODIUM CHLORIDE 0.9 % IV SOLN
Freq: Once | INTRAVENOUS | Status: AC
  Filled 2024-01-17: qty 250

## 2024-01-17 MED ORDER — MONTELUKAST SODIUM 10 MG PO TABS
10.0000 mg | ORAL_TABLET | Freq: Once | ORAL | Status: AC
  Administered 2024-01-17: 10 mg via ORAL
  Filled 2024-01-17: qty 1

## 2024-01-17 MED ORDER — DIPHENHYDRAMINE HCL 25 MG PO CAPS
50.0000 mg | ORAL_CAPSULE | Freq: Once | ORAL | Status: AC
  Administered 2024-01-17: 50 mg via ORAL
  Filled 2024-01-17: qty 2

## 2024-01-17 MED ORDER — HYDROCODONE-ACETAMINOPHEN 5-325 MG PO TABS: 1.0000 | ORAL_TABLET | ORAL | 0 refills | Status: DC | PRN

## 2024-01-17 NOTE — Patient Instructions (Signed)
 [facility] - A DEPT OF [facility]  Discharge Instructions: Thank you for choosing [facility] to provide your oncology and hematology care.  If you have a lab appointment with the Cancer Center, please go directly to the Cancer Center and check in at the registration area.  Wear comfortable clothing and clothing appropriate for easy access to any Portacath or PICC line.   We strive to give you quality time with your provider. You may need to reschedule your appointment if you arrive late (15 or more minutes).  Arriving late affects you and other patients whose appointments are after yours.  Also, if you miss three or more appointments without notifying the office, you may be dismissed from the clinic at the provider's discretion.      For prescription refill requests, have your pharmacy contact our office and allow 72 hours for refills to be completed.    Today you received the following chemotherapy and/or immunotherapy agents :   daratumumab     To help prevent nausea and vomiting after your treatment, we encourage you to take your nausea medication as directed.  BELOW ARE SYMPTOMS THAT SHOULD BE REPORTED IMMEDIATELY: *FEVER GREATER THAN 100.4 F (38 C) OR HIGHER *CHILLS OR SWEATING *NAUSEA AND VOMITING THAT IS NOT CONTROLLED WITH YOUR NAUSEA MEDICATION *UNUSUAL SHORTNESS OF BREATH *UNUSUAL BRUISING OR BLEEDING *URINARY PROBLEMS (pain or burning when urinating, or frequent urination) *BOWEL PROBLEMS (unusual diarrhea, constipation, pain near the anus) TENDERNESS IN MOUTH AND THROAT WITH OR WITHOUT PRESENCE OF ULCERS (sore throat, sores in mouth, or a toothache) UNUSUAL RASH, SWELLING OR PAIN  UNUSUAL VAGINAL DISCHARGE OR ITCHING   Items with * indicate a potential emergency and should be followed up as soon as possible or go to the Emergency Department if any problems should occur.  Please show the CHEMOTHERAPY ALERT CARD or  IMMUNOTHERAPY ALERT CARD at check-in to the Emergency Department and triage nurse.  Should you have questions after your visit or need to cancel or reschedule your appointment, please contact [facility] - A DEPT OF [facility]  [phone] and follow the prompts.  Office hours are 8:00 a.m. to 4:30 p.m. Monday - Friday. Please note that voicemails left after 4:00 p.m. may not be returned until the following business day.  We are closed weekends and major holidays. You have access to a nurse at all times for urgent questions. Please call the main number to the clinic [phone] and follow the prompts.  For any non-urgent questions, you may also contact your provider using MyChart. We now offer e-Visits for anyone 25 and older to request care online for non-urgent symptoms. For details visit [email or web address].   Also download the MyChart app! Go to the app store, search MyChart, open the app, select [place], and log in with your MyChart username and password.

## 2024-01-17 NOTE — Assessment & Plan Note (Signed)
 Chemotherapy induced. Monitor closely.

## 2024-01-17 NOTE — Assessment & Plan Note (Signed)
 Hold off BP meds. Recommend patient to monitor BP at home and hold off Bp meds if SBP<100.  IVF 1L NS x 1

## 2024-01-17 NOTE — Assessment & Plan Note (Signed)
 Secondary to multiple myeloma.  Monitor closely.

## 2024-01-17 NOTE — Assessment & Plan Note (Signed)
 Continue Norco 5/325mg  Q4-6hour PRN.  Recommend to increase MS Contin  15 mg twice daily to every 8 hours.

## 2024-01-17 NOTE — Assessment & Plan Note (Addendum)
 Bone marrow biopsy results, PET scan findings, lab results were reviewed and discussed with patient. Diagnosis of IgA kappa multiple myeloma was reviewed and discussed.  Lab Results  Component Value Date   MPROTEIN 4.7 (H) 01/03/2024   KPAFRELGTCHN 1,540.0 (H) 01/03/2024   LAMBDASER 3.1 (L) 01/03/2024   KAPLAMBRATIO 496.77 (H) 01/03/2024    Normal Cytogenetics and multiple myeloma FISH testing results are pending. Patient is a transplant candidate.   Labs are reviewed and discussed with patient. Proceed with Dara RVD.  She took 3 days of Revlimid  for cycle 1.  Thrombosis prophylaxis, aspirin 81 mg daily. Shingle prophylaxis: acyclovir 

## 2024-01-17 NOTE — Telephone Encounter (Signed)
 Refill was sent earlier today for the first partial cycle the patient used with cycle 1 of treatment, this was an error.  Updated rx sent to [facility] patient is now taking a full cycle for 14 on/7off. Emailed [facility] to provide clarification on which rx should be filled.

## 2024-01-17 NOTE — Assessment & Plan Note (Signed)
 Improved today. HR 54

## 2024-01-17 NOTE — Progress Notes (Signed)
 Hematology/Oncology Progress note [phone] [phone]       CHIEF COMPLAINTS/PURPOSE OF CONSULTATION:  Multiple myeloma  ASSESSMENT & PLAN:   Multiple myeloma (HCC) Bone marrow biopsy results, PET scan findings, lab results were reviewed and discussed with patient. Diagnosis of IgA kappa multiple myeloma was reviewed and discussed.  Lab Results  Component Value Date   MPROTEIN 4.7 (H) 01/03/2024   KPAFRELGTCHN 1,540.0 (H) 01/03/2024   LAMBDASER 3.1 (L) 01/03/2024   KAPLAMBRATIO 496.77 (H) 01/03/2024    Normal Cytogenetics and multiple myeloma FISH testing results are pending. Patient is a transplant candidate.   Labs are reviewed and discussed with patient. Proceed with Dara RVD.  She took 3 days of Revlimid  for cycle 1.  Thrombosis prophylaxis, aspirin 81 mg daily. Shingle prophylaxis: acyclovir    Bradycardia Improved today. HR 54  Macrocytic anemia Secondary to multiple myeloma.  Monitor closely.  Neoplasm related pain Continue Norco 5/325mg  Q4-6hour PRN.  Recommend to increase MS Contin  15 mg twice daily to every 8 hours.   Hypotension Hold off BP meds. Recommend patient to monitor BP at home and hold off Bp meds if SBP<100.  IVF 1L NS x 1  Thrombocytopenia (HCC) Chemotherapy induced. Monitor closely   No orders of the defined types were placed in this encounter.  Follow-up per LOS All questions were answered. The patient knows to call the clinic with any problems, questions or concerns.  Zelphia Cap, MD, PhD [facility] Hematology Oncology 01/17/2024    HISTORY OF PRESENTING ILLNESS:  Dana Wallace 68 y.o. female presents to establish care for metastatic bone lesions.   Oncology History  Multiple myeloma (HCC)  12/09/2023 Initial Diagnosis   Multiple myeloma (HCC)  She initially experienced chest discomfort starting in her left shoulder in February, which persisted and led her to seek medical attention in April. Despite a  comprehensive workup including a chest x-ray, EKG, echocardiogram, mammogram, and a DEXA scan, all results were normal.  In May, she had two follow-up visits with her primary care physician. The discomfort was primarily located behind her left breast and later moved to her right clavicle and sternum area, becoming very painful and tender to touch. An x-ray at [facility] was performed, and the patient was told there was some type of lesion, after which she underwent an MRI of the right clavicle, shoulder, and scapula. Interval marrow biopsy, lab workup and PET scan. She was found to have stage II IgA kappa multiple myeloma.   12/15/2023 Imaging   PET scan showed  1. Diffuse and extensive lytic bone lesions throughout the axial and appendicular skeleton most likely reflecting multiple myeloma. 2. Pathologic fracture involving the right clavicle. 3. Large soft tissue masses associated with the left second anterior rib and the lower sternum. 4. No findings for a primary neoplastic process involving the neck, chest, abdomen or pelvis   12/18/2023 Bone Marrow Biopsy   Pulmonary biopsy aspirate, clot, core Showed hypercellular bone marrow, involved by kappa restricted plasma cell neoplasm compromising 30-100%, average 70%, of the cellular marrow.   12/25/2023 Cancer Staging   Staging form: Plasma Cell Myeloma and Plasma Cell Disorders, AJCC 8th Edition - Clinical stage from 12/25/2023: Beta-2 -microglobulin (mg/L): 3.9, Albumin (g/dL): 3.2, ISS: Stage II, LDH: Normal - Signed by Cap Zelphia, MD on 12/25/2023 Stage prefix: Initial diagnosis Beta 2 microglobulin range (mg/L): 3.5 to 5.49 Albumin range (g/dL): Less than 3.5   1/84/7974 -  Chemotherapy   Patient is on Treatment Plan : MYELOMA  NEWLY DIAGNOSED TRANSPLANT CANDIDATE DaraVRd (Daratumumab  SQ) q21d x 6 Cycles (Induction/Consolidation)       She has pain of upper chest and back, right shoulder, and the left hip.   Patient currently takes Norco  as needed as well as MS Contin  15 mg Q8h  She takes about 5-6 Norco per 24 hours.  Pain control is better. Accompanied by husband.  MEDICAL HISTORY:  Past Medical History:  Diagnosis Date   Anxiety    Diabetes mellitus without complication (HCC)    Hypertension     SURGICAL HISTORY: Past Surgical History:  Procedure Laterality Date   CESAREAN SECTION     CHOLECYSTECTOMY     IR BONE MARROW BIOPSY & ASPIRATION  12/18/2023   TUBAL LIGATION      SOCIAL HISTORY: Social History   Socioeconomic History   Marital status: Married    Spouse name: Not on file   Number of children: Not on file   Years of education: Not on file   Highest education level: Associate degree: academic program  Occupational History   Not on file  Tobacco Use   Smoking status: Never   Smokeless tobacco: Never  Substance and Sexual Activity   Alcohol use: No   Drug use: Never   Sexual activity: Not Currently    Birth control/protection: None  Other Topics Concern   Not on file  Social History Narrative   Not on file   Social Drivers of Health   Financial Resource Strain: Low Risk  (08/29/2023)   Overall Financial Resource Strain (CARDIA)    Difficulty of Paying Living Expenses: Not hard at all  Food Insecurity: No Food Insecurity (12/09/2023)   Hunger Vital Sign    Worried About Running Out of Food in the Last Year: Never true    Ran Out of Food in the Last Year: Never true  Transportation Needs: No Transportation Needs (12/09/2023)   PRAPARE - Administrator, Civil Service (Medical): No    Lack of Transportation (Non-Medical): No  Physical Activity: Sufficiently Active (10/03/2023)   Exercise Vital Sign    Days of Exercise per Week: 6 days    Minutes of Exercise per Session: 30 min  Recent Concern: Physical Activity - Inactive (08/29/2023)   Exercise Vital Sign    Days of Exercise per Week: 0 days    Minutes of Exercise per Session: 30 min  Stress: Stress Concern Present (08/29/2023)    [employer] of Occupational Health - Occupational Stress Questionnaire    Feeling of Stress : To some extent  Social Connections: Moderately Isolated (08/29/2023)   Social Connection and Isolation Panel    Frequency of Communication with Friends and Family: More than three times a week    Frequency of Social Gatherings with Friends and Family: Once a week    Attends Religious Services: Never    Database administrator or Organizations: No    Attends Engineer, structural: Not on file    Marital Status: Married  Catering manager Violence: Not At Risk (12/09/2023)   Humiliation, Afraid, Rape, and Kick questionnaire    Fear of Current or Ex-Partner: No    Emotionally Abused: No    Physically Abused: No    Sexually Abused: No    FAMILY HISTORY: Family History  Problem Relation Age of Onset   COPD Mother    Multiple sclerosis Mother    Varicose Veins Mother    Hodgkin's lymphoma Sister    Cancer Sister  Breast cancer Neg Hx     ALLERGIES:  has no known allergies.  MEDICATIONS:  Current Outpatient Medications  Medication Sig Dispense Refill   acyclovir  (ZOVIRAX ) 400 MG tablet Take 1 tablet (400 mg total) by mouth 2 (two) times daily. 60 tablet 3   albuterol  (VENTOLIN  HFA) 108 (90 Base) MCG/ACT inhaler Inhale 2 puffs into the lungs every 6 (six) hours as needed for wheezing or shortness of breath. 8 g 2   ALPRAZolam  (XANAX ) 0.5 MG tablet Take 0.5-1 tablets (0.25-0.5 mg total) by mouth 2 (two) times daily as needed for anxiety. 30 tablet 0   aspirin 81 MG tablet Take by mouth.     Blood Glucose Monitoring Suppl (ONE TOUCH ULTRA 2) w/Device KIT 1 each by Does not apply route daily at 2 PM. 1 kit 0   glipiZIDE  (GLUCOTROL  XL) 5 MG 24 hr tablet TAKE 1 TABLET(5 MG) BY MOUTH DAILY WITH BREAKFAST 90 tablet 1   glucose blood (ONETOUCH ULTRA) test strip 1 each by Other route daily. Use as instructed 100 each 12   hydrochlorothiazide  (MICROZIDE ) 12.5 MG capsule TAKE 1  CAPSULE(12.5 MG) BY MOUTH DAILY 90 capsule 0   HYDROcodone -acetaminophen  (NORCO/VICODIN) 5-325 MG tablet Take 1-2 tablets by mouth every 4 (four) hours as needed for moderate pain (pain score 4-6). 60 tablet 0   latanoprost (XALATAN) 0.005 % ophthalmic solution 1 drop at bedtime.     lisinopril  (ZESTRIL ) 20 MG tablet TAKE 1 TABLET(20 MG) BY MOUTH DAILY 90 tablet 3   metFORMIN  (GLUCOPHAGE ) 1000 MG tablet Take 1 tablet (1,000 mg total) by mouth 2 (two) times daily with a meal. 180 tablet 3   montelukast  (SINGULAIR ) 10 MG tablet Take 1 tablet (10 mg total) by mouth daily as needed (wheezing). 10 tablet 0   morphine  (MS CONTIN ) 15 MG 12 hr tablet Take 1 tablet (15 mg total) by mouth every 8 (eight) hours. 90 tablet 0   ondansetron  (ZOFRAN ) 8 MG tablet Take 1 tablet (8 mg total) by mouth every 8 (eight) hours as needed for nausea or vomiting. 30 tablet 1   simvastatin  (ZOCOR ) 10 MG tablet TAKE 1 TABLET(10 MG) BY MOUTH DAILY AT 6 PM 90 tablet 2   lenalidomide  (REVLIMID ) 25 MG capsule Take 1 capsule (25 mg total) by mouth daily. Take for 14 days, then hold for 7 days. Repeat every 21 days. 14 capsule 0   Multiple Vitamin tablet      naloxone  (NARCAN ) nasal spray 4 mg/0.1 mL SPRAY 1 SPRAY INTO ONE NOSTRIL AS DIRECTED FOR OPIOID OVERDOSE (TURN PERSON ON SIDE AFTER DOSE. IF NO RESPONSE IN 2-3 MINUTES OR PERSON RESPONDS BUT RELAPSES, REPEAT USING A NEW SPRAY DEVICE AND SPRAY INTO THE OTHER NOSTRIL. CALL 911 AFTER USE.) * EMERGENCY USE ONLY * 1 each 0   Omega-3 Fatty Acids (FISH OIL) 1200 MG CPDR Take by mouth daily.     No current facility-administered medications for this visit.    Review of Systems  Constitutional:  Positive for fatigue. Negative for appetite change, chills and fever.  HENT:   Negative for hearing loss and voice change.   Eyes:  Negative for eye problems.  Respiratory:  Negative for chest tightness and cough.   Cardiovascular:  Negative for chest pain.  Gastrointestinal:  Negative for  abdominal distention, abdominal pain and blood in stool.  Endocrine: Negative for hot flashes.  Genitourinary:  Negative for difficulty urinating and frequency.   Musculoskeletal:  Positive for arthralgias.  Shoulder, upper chest, hip pain  Skin:  Negative for itching and rash.  Neurological:  Negative for extremity weakness.  Hematological:  Negative for adenopathy.  Psychiatric/Behavioral:  Negative for confusion.      PHYSICAL EXAMINATION: ECOG PERFORMANCE STATUS: 1 - Symptomatic but completely ambulatory  Vitals:   01/17/24 0904  BP: (!) 95/44  Resp: 18  Temp: 99.1 F (37.3 C)  SpO2: 98%   Filed Weights   01/17/24 0904  Weight: 175 lb 1.6 oz (79.4 kg)    Physical Exam Constitutional:      General: She is not in acute distress.    Appearance: She is not diaphoretic.  HENT:     Head: Normocephalic and atraumatic.  Eyes:     General: No scleral icterus. Cardiovascular:     Rate and Rhythm: Normal rate. Rhythm irregular.     Heart sounds: No murmur heard. Pulmonary:     Effort: Pulmonary effort is normal. No respiratory distress.     Breath sounds: Normal breath sounds. No wheezing.  Abdominal:     General: Bowel sounds are normal. There is no distension.     Palpations: Abdomen is soft.     Tenderness: There is no abdominal tenderness.  Musculoskeletal:        General: Normal range of motion.     Cervical back: Normal range of motion and neck supple.  Skin:    General: Skin is warm and dry.     Findings: No erythema.  Neurological:     Mental Status: She is alert and oriented to person, place, and time. Mental status is at baseline.     Cranial Nerves: No cranial nerve deficit.     Motor: No abnormal muscle tone.     Coordination: Coordination normal.  Psychiatric:        Mood and Affect: Mood and affect normal.      LABORATORY DATA:  I have reviewed the data as listed    Latest Ref Rng & Units 01/17/2024    8:44 AM 01/14/2024    8:18 AM  01/10/2024   12:36 PM  CBC  WBC 4.0 - 10.5 K/uL 3.3  3.4  5.2   Hemoglobin 12.0 - 15.0 g/dL 9.3  9.2  9.3   Hematocrit 36.0 - 46.0 % 29.1  28.2  29.3   Platelets 150 - 400 K/uL 66  69  101       Latest Ref Rng & Units 01/17/2024    8:44 AM 01/14/2024    8:17 AM 01/10/2024   12:36 PM  CMP  Glucose 70 - 99 mg/dL 877  880  841   BUN 8 - 23 mg/dL 19  27  31    Creatinine 0.44 - 1.00 mg/dL 9.26  9.15  8.86   Sodium 135 - 145 mmol/L 133  136  133   Potassium 3.5 - 5.1 mmol/L 4.9  5.4  5.0   Chloride 98 - 111 mmol/L 97  98  98   CO2 22 - 32 mmol/L 26  27  22    Calcium  8.9 - 10.3 mg/dL 8.7  8.9  7.8   Total Protein 6.5 - 8.1 g/dL 8.1   9.0   Total Bilirubin 0.0 - 1.2 mg/dL 0.5   0.2   Alkaline Phos 38 - 126 U/L 79   55   AST 15 - 41 U/L 35   31   ALT 0 - 44 U/L 14   14      RADIOGRAPHIC STUDIES:  I have personally reviewed the radiological images as listed and agreed with the findings in the report. DG Chest 2 View Result Date: 01/10/2024 CLINICAL DATA:  Bradycardia. EXAM: CHEST - 2 VIEW COMPARISON:  PET scan of December 13, 2023. Radiograph of August 30, 2023. FINDINGS: Stable cardiomediastinal silhouette. Pleural based mass is noted in left upper lobe laterally consistent with multiple myeloma as noted on prior PET scan. No acute consolidative process is noted. IMPRESSION: Pleural based mass is noted in left upper lobe laterally consistent with multiple myeloma as noted on prior PET scan. Electronically Signed   By: Lynwood Landy Raddle M.D.   On: 01/10/2024 14:29

## 2024-01-21 ENCOUNTER — Telehealth: Payer: Self-pay | Admitting: *Deleted

## 2024-01-21 NOTE — Telephone Encounter (Signed)
 Dr. Babara has contacted provider at [facility]

## 2024-01-21 NOTE — Telephone Encounter (Signed)
 Office of [facility] asking to have Dr. Babara to call the dentist  [phone] please today

## 2024-01-23 ENCOUNTER — Ambulatory Visit: Payer: Self-pay | Admitting: Family Medicine

## 2024-01-23 ENCOUNTER — Ambulatory Visit (INDEPENDENT_AMBULATORY_CARE_PROVIDER_SITE_OTHER): Admitting: Family Medicine

## 2024-01-23 ENCOUNTER — Encounter: Payer: Self-pay | Admitting: Family Medicine

## 2024-01-23 VITALS — BP 130/73 | HR 101 | Temp 98.8°F | Ht 60.0 in | Wt 181.1 lb

## 2024-01-23 DIAGNOSIS — E1169 Type 2 diabetes mellitus with other specified complication: Secondary | ICD-10-CM | POA: Diagnosis not present

## 2024-01-23 DIAGNOSIS — F4321 Adjustment disorder with depressed mood: Secondary | ICD-10-CM | POA: Diagnosis not present

## 2024-01-23 DIAGNOSIS — C9000 Multiple myeloma not having achieved remission: Secondary | ICD-10-CM

## 2024-01-23 DIAGNOSIS — Z7984 Long term (current) use of oral hypoglycemic drugs: Secondary | ICD-10-CM

## 2024-01-23 LAB — POCT GLYCOSYLATED HEMOGLOBIN (HGB A1C): Hemoglobin A1C: 6 % — AB (ref 4.0–5.6)

## 2024-01-23 NOTE — Assessment & Plan Note (Signed)
 Blood sugar levels well-managed, with morning readings under 100. Concerns about potential hyperglycemia due to steroid use in chemotherapy. Currently on metformin  and glipizide . Previous A1c was 5.9, aiming to keep it under 7. Glipizide  effective in counteracting steroid-induced hyperglycemia. - Check A1c today and send results via MyChart. Stable at 6.0 - Continue metformin  and glipizide . - Adjust glipizide  dosage if A1c rises above 7. - continue to monitor home blood glucose

## 2024-01-23 NOTE — Progress Notes (Signed)
 Established patient visit   Patient: Dana Wallace   DOB: May 03, 1956   68 y.o. Female  MRN: [id number] Visit Date: 01/23/2024  Today's healthcare provider: Jon Eva, MD   Chief Complaint  Patient presents with   Cancer    Patient is present to discuss new cancer diagnosis and to see if she can have her A1c checked   Subjective    HPI HPI     Cancer    Additional comments: Patient is present to discuss new cancer diagnosis and to see if she can have her A1c checked      Last edited by Lilian Fitzpatrick, CMA on 01/23/2024  3:37 PM.       Discussed the use of AI scribe software for clinical note transcription with the patient, who gave verbal consent to proceed.  History of Present Illness   Dana Wallace is a 68 year old female with IgA kappa multiple myeloma who presents for follow-up on her condition and management of related symptoms.  She experiences significant bone pain, particularly in the chest and clavicle, which has increased this week. Pain management includes MS Contin  and hydrocodone  for breakthrough pain. Mobility is affected due to pain and pathological fractures in the sternum.  Her chemotherapy regimen includes Revlimid , with aspirin for thrombosis prophylaxis and Aciclovir for shingles prophylaxis. She is concerned about the impact of steroids on her blood sugar, although it remains below 100 mg/dL most mornings. Lisinopril  and hydrochlorothiazide  are held due to low blood pressure and pulse rates, which have been as low as 30 bpm.  She has a history of hypertension and diabetes, previously managed with lisinopril , hydrochlorothiazide , metformin , and glipizide . Her blood pressure has been low since starting chemotherapy, leading to the temporary discontinuation of lisinopril  and hydrochlorothiazide . Her A1c was last checked at 5.9%.  She experiences emotional distress related to her diagnosis and treatment, describing feelings of being 'in a  fog' and having 'dark days.' She has not yet sought therapy but acknowledges the emotional impact of her diagnosis.         Medications: Outpatient Medications Prior to Visit  Medication Sig   acyclovir  (ZOVIRAX ) 400 MG tablet Take 1 tablet (400 mg total) by mouth 2 (two) times daily.   albuterol  (VENTOLIN  HFA) 108 (90 Base) MCG/ACT inhaler Inhale 2 puffs into the lungs every 6 (six) hours as needed for wheezing or shortness of breath.   ALPRAZolam  (XANAX ) 0.5 MG tablet Take 0.5-1 tablets (0.25-0.5 mg total) by mouth 2 (two) times daily as needed for anxiety.   aspirin 81 MG tablet Take by mouth.   Blood Glucose Monitoring Suppl (ONE TOUCH ULTRA 2) w/Device KIT 1 each by Does not apply route daily at 2 PM.   glipiZIDE  (GLUCOTROL  XL) 5 MG 24 hr tablet TAKE 1 TABLET(5 MG) BY MOUTH DAILY WITH BREAKFAST   glucose blood (ONETOUCH ULTRA) test strip 1 each by Other route daily. Use as instructed   HYDROcodone -acetaminophen  (NORCO/VICODIN) 5-325 MG tablet Take 1-2 tablets by mouth every 4 (four) hours as needed for moderate pain (pain score 4-6).   latanoprost (XALATAN) 0.005 % ophthalmic solution 1 drop at bedtime.   lenalidomide  (REVLIMID ) 25 MG capsule Take 1 capsule (25 mg total) by mouth daily. Take for 14 days, then hold for 7 days. Repeat every 21 days.   metFORMIN  (GLUCOPHAGE ) 1000 MG tablet Take 1 tablet (1,000 mg total) by mouth 2 (two) times daily with a meal.   montelukast  (SINGULAIR ) 10  MG tablet Take 1 tablet (10 mg total) by mouth daily as needed (wheezing).   morphine  (MS CONTIN ) 15 MG 12 hr tablet Take 1 tablet (15 mg total) by mouth every 8 (eight) hours.   naloxone  (NARCAN ) nasal spray 4 mg/0.1 mL SPRAY 1 SPRAY INTO ONE NOSTRIL AS DIRECTED FOR OPIOID OVERDOSE (TURN PERSON ON SIDE AFTER DOSE. IF NO RESPONSE IN 2-3 MINUTES OR PERSON RESPONDS BUT RELAPSES, REPEAT USING A NEW SPRAY DEVICE AND SPRAY INTO THE OTHER NOSTRIL. CALL 911 AFTER USE.) * EMERGENCY USE ONLY *   ondansetron   (ZOFRAN ) 8 MG tablet Take 1 tablet (8 mg total) by mouth every 8 (eight) hours as needed for nausea or vomiting.   simvastatin  (ZOCOR ) 10 MG tablet TAKE 1 TABLET(10 MG) BY MOUTH DAILY AT 6 PM   [DISCONTINUED] hydrochlorothiazide  (MICROZIDE ) 12.5 MG capsule TAKE 1 CAPSULE(12.5 MG) BY MOUTH DAILY   [DISCONTINUED] lisinopril  (ZESTRIL ) 20 MG tablet TAKE 1 TABLET(20 MG) BY MOUTH DAILY   [DISCONTINUED] Multiple Vitamin tablet    [DISCONTINUED] Omega-3 Fatty Acids (FISH OIL) 1200 MG CPDR Take by mouth daily.   No facility-administered medications prior to visit.    Review of Systems     Objective    BP 130/73 (BP Location: Left Arm, Patient Position: Sitting, Cuff Size: Normal)   Pulse (!) 101   Temp 98.8 F (37.1 C) (Oral)   Ht 5' (1.524 m)   Wt 181 lb 1.6 oz (82.1 kg)   SpO2 99%   BMI 35.37 kg/m    Physical Exam Vitals reviewed.  Constitutional:      General: She is not in acute distress.    Appearance: Normal appearance. She is well-developed. She is not diaphoretic.  HENT:     Head: Normocephalic and atraumatic.  Eyes:     General: No scleral icterus.    Conjunctiva/sclera: Conjunctivae normal.  Neck:     Thyroid : No thyromegaly.  Cardiovascular:     Rate and Rhythm: Normal rate and regular rhythm.     Heart sounds: Normal heart sounds. No murmur heard. Pulmonary:     Effort: Pulmonary effort is normal. No respiratory distress.     Breath sounds: Normal breath sounds. No wheezing, rhonchi or rales.  Musculoskeletal:     Cervical back: Neck supple.     Right lower leg: No edema.     Left lower leg: No edema.  Lymphadenopathy:     Cervical: No cervical adenopathy.  Skin:    General: Skin is warm and dry.     Findings: No rash.  Neurological:     Mental Status: She is alert and oriented to person, place, and time. Mental status is at baseline.  Psychiatric:     Comments: tearful      No results found for any visits on 01/23/24.  Assessment & Plan     Problem  List Items Addressed This Visit       Endocrine   T2DM (type 2 diabetes mellitus) (HCC) - Primary (Chronic)   Relevant Orders   POCT HgB A1C     Other   Multiple myeloma (HCC) (Chronic)   Morbid obesity (HCC)   BMI >35 and assoc with T2DM, HTN Fluctuating appetite           Multiple myeloma with bone involvement and chronic pain IgA kappa multiple myeloma with normal cytogenetics. Bone marrow transplant candidate. Significant bone pain due to bony lesions and pathological fractures, particularly in the sternum. Undergoing chemotherapy with Revlimid  and steroids.  Concerns about bone marrow transplant and life expectancy discussed. - Continue MS Contin  and Norco for pain management. - Continue Revlimid , aspirin, and Aciclovir as prescribed by oncology. - Discuss concerns about bone marrow transplant with oncology. - Encourage continued follow-up with oncology for chemotherapy and transplant planning.  Hypotension secondary to chemotherapy Experiencing hypotension with significant blood pressure drops during chemotherapy. Pulse has been as low as 30. Lisinopril  and hydrochlorothiazide  held due to low blood pressure. Current blood pressure 107/50 with pulse 57. - Continue to hold lisinopril  and hydrochlorothiazide . - Monitor blood pressure regularly during chemotherapy. - Reassess need for antihypertensives post-chemotherapy.  Depression Experiencing emotional distress and dark days following multiple myeloma diagnosis. Concerns about impact on family and future. No current therapy or counseling in place. - Offer referral to behavioral health therapist for counseling. - Encourage open discussion of feelings and emotional support. - Schedule regular follow-up visits to monitor emotional well-being.  Follow-Up Regular follow-up needed to monitor multiple conditions and emotional well-being. Coordination with oncology is essential for comprehensive care. - Schedule follow-up  appointment in 3 months. - Communicate A1c results via MyChart. - Coordinate care with oncology and monitor updates.          Return in about 3 months (around 04/23/2024) for chronic disease f/u.       Jon Eva, MD  [facility] [phone] (phone) [phone] (fax)  [facility]

## 2024-01-23 NOTE — Assessment & Plan Note (Signed)
 BMI >35 and assoc with T2DM, HTN Fluctuating appetite

## 2024-01-24 ENCOUNTER — Inpatient Hospital Stay

## 2024-01-24 ENCOUNTER — Inpatient Hospital Stay: Attending: Oncology

## 2024-01-24 ENCOUNTER — Inpatient Hospital Stay: Admitting: Oncology

## 2024-01-24 ENCOUNTER — Encounter: Payer: Self-pay | Admitting: Oncology

## 2024-01-24 VITALS — BP 130/52 | HR 84 | Temp 98.0°F | Resp 18 | Wt 181.4 lb

## 2024-01-24 DIAGNOSIS — C9000 Multiple myeloma not having achieved remission: Secondary | ICD-10-CM

## 2024-01-24 DIAGNOSIS — D539 Nutritional anemia, unspecified: Secondary | ICD-10-CM | POA: Insufficient documentation

## 2024-01-24 DIAGNOSIS — Z807 Family history of other malignant neoplasms of lymphoid, hematopoietic and related tissues: Secondary | ICD-10-CM | POA: Diagnosis not present

## 2024-01-24 DIAGNOSIS — Z5112 Encounter for antineoplastic immunotherapy: Secondary | ICD-10-CM | POA: Diagnosis not present

## 2024-01-24 DIAGNOSIS — Z23 Encounter for immunization: Secondary | ICD-10-CM | POA: Insufficient documentation

## 2024-01-24 DIAGNOSIS — Z5111 Encounter for antineoplastic chemotherapy: Secondary | ICD-10-CM | POA: Diagnosis present

## 2024-01-24 DIAGNOSIS — G893 Neoplasm related pain (acute) (chronic): Secondary | ICD-10-CM | POA: Insufficient documentation

## 2024-01-24 LAB — CBC WITH DIFFERENTIAL (CANCER CENTER ONLY)
Abs Immature Granulocytes: 0.01 K/uL (ref 0.00–0.07)
Basophils Absolute: 0 K/uL (ref 0.0–0.1)
Basophils Relative: 0 %
Eosinophils Absolute: 0.1 K/uL (ref 0.0–0.5)
Eosinophils Relative: 2 %
HCT: 28.5 % — ABNORMAL LOW (ref 36.0–46.0)
Hemoglobin: 9.1 g/dL — ABNORMAL LOW (ref 12.0–15.0)
Immature Granulocytes: 0 %
Lymphocytes Relative: 17 %
Lymphs Abs: 0.8 K/uL (ref 0.7–4.0)
MCH: 32.5 pg (ref 26.0–34.0)
MCHC: 31.9 g/dL (ref 30.0–36.0)
MCV: 101.8 fL — ABNORMAL HIGH (ref 80.0–100.0)
Monocytes Absolute: 0.8 K/uL (ref 0.1–1.0)
Monocytes Relative: 15 %
Neutro Abs: 3.2 K/uL (ref 1.7–7.7)
Neutrophils Relative %: 66 %
Platelet Count: 264 K/uL (ref 150–400)
RBC: 2.8 MIL/uL — ABNORMAL LOW (ref 3.87–5.11)
RDW: 15.3 % (ref 11.5–15.5)
WBC Count: 4.9 K/uL (ref 4.0–10.5)
nRBC: 0 % (ref 0.0–0.2)

## 2024-01-24 LAB — CMP (CANCER CENTER ONLY)
ALT: 17 U/L (ref 0–44)
AST: 19 U/L (ref 15–41)
Albumin: 3.2 g/dL — ABNORMAL LOW (ref 3.5–5.0)
Alkaline Phosphatase: 138 U/L — ABNORMAL HIGH (ref 38–126)
Anion gap: 9 (ref 5–15)
BUN: 10 mg/dL (ref 8–23)
CO2: 25 mmol/L (ref 22–32)
Calcium: 8.7 mg/dL — ABNORMAL LOW (ref 8.9–10.3)
Chloride: 104 mmol/L (ref 98–111)
Creatinine: 0.52 mg/dL (ref 0.44–1.00)
GFR, Estimated: >60 mL/min (ref >60)
Glucose, Bld: 88 mg/dL (ref 70–99)
Potassium: 4.5 mmol/L (ref 3.5–5.1)
Sodium: 138 mmol/L (ref 135–145)
Total Bilirubin: 0.4 mg/dL (ref 0.0–1.2)
Total Protein: 7.1 g/dL (ref 6.5–8.1)

## 2024-01-24 MED ORDER — DEXAMETHASONE 4 MG PO TABS
20.0000 mg | ORAL_TABLET | Freq: Once | ORAL | Status: AC
  Administered 2024-01-24: 20 mg via ORAL
  Filled 2024-01-24: qty 5

## 2024-01-24 MED ORDER — ACETAMINOPHEN 325 MG PO TABS
650.0000 mg | ORAL_TABLET | Freq: Once | ORAL | Status: AC
  Administered 2024-01-24: 650 mg via ORAL
  Filled 2024-01-24: qty 2

## 2024-01-24 MED ORDER — BORTEZOMIB CHEMO SQ INJECTION 3.5 MG (2.5MG/ML)
1.3000 mg/m2 | Freq: Once | INTRAMUSCULAR | Status: AC
  Administered 2024-01-24: 2.5 mg via SUBCUTANEOUS
  Filled 2024-01-24: qty 1

## 2024-01-24 MED ORDER — DIPHENHYDRAMINE HCL 25 MG PO CAPS
50.0000 mg | ORAL_CAPSULE | Freq: Once | ORAL | Status: AC
  Administered 2024-01-24: 50 mg via ORAL
  Filled 2024-01-24: qty 2

## 2024-01-24 MED ORDER — DARATUMUMAB-HYALURONIDASE-FIHJ 1800-30000 MG-UT/15ML ~~LOC~~ SOLN
1800.0000 mg | Freq: Once | SUBCUTANEOUS | Status: AC
  Administered 2024-01-24: 1800 mg via SUBCUTANEOUS
  Filled 2024-01-24: qty 15

## 2024-01-24 NOTE — Assessment & Plan Note (Addendum)
 Bone marrow biopsy results, PET scan findings, lab results were reviewed and discussed with patient. Diagnosis of IgA kappa multiple myeloma was reviewed and discussed.  Lab Results  Component Value Date   MPROTEIN 4.7 (H) 01/03/2024   KPAFRELGTCHN 1,540.0 (H) 01/03/2024   LAMBDASER 3.1 (L) 01/03/2024   KAPLAMBRATIO 496.77 (H) 01/03/2024    Normal Cytogenetics and multiple myeloma FISH testing results are pending. Patient is a transplant candidate.   Labs are reviewed and discussed with patient. Proceed with cycle 2 D1 Dara RVD.   Thrombosis prophylaxis, aspirin 81 mg daily. Shingle prophylaxis: acyclovir  She has ongoing dental issue, not cleared by dentist for further zometa  treatments.

## 2024-01-24 NOTE — Patient Instructions (Signed)
 [facility] - A DEPT OF [facility]  Discharge Instructions: Thank you for choosing [facility] to provide your oncology and hematology care.  If you have a lab appointment with the Cancer Center, please go directly to the Cancer Center and check in at the registration area.  Wear comfortable clothing and clothing appropriate for easy access to any Portacath or PICC line.   We strive to give you quality time with your provider. You may need to reschedule your appointment if you arrive late (15 or more minutes).  Arriving late affects you and other patients whose appointments are after yours.  Also, if you miss three or more appointments without notifying the office, you may be dismissed from the clinic at the provider's discretion.      For prescription refill requests, have your pharmacy contact our office and allow 72 hours for refills to be completed.    Today you received the following chemotherapy and/or immunotherapy agents : Dara & bortezomib        To help prevent nausea and vomiting after your treatment, we encourage you to take your nausea medication as directed.  BELOW ARE SYMPTOMS THAT SHOULD BE REPORTED IMMEDIATELY: *FEVER GREATER THAN 100.4 F (38 C) OR HIGHER *CHILLS OR SWEATING *NAUSEA AND VOMITING THAT IS NOT CONTROLLED WITH YOUR NAUSEA MEDICATION *UNUSUAL SHORTNESS OF BREATH *UNUSUAL BRUISING OR BLEEDING *URINARY PROBLEMS (pain or burning when urinating, or frequent urination) *BOWEL PROBLEMS (unusual diarrhea, constipation, pain near the anus) TENDERNESS IN MOUTH AND THROAT WITH OR WITHOUT PRESENCE OF ULCERS (sore throat, sores in mouth, or a toothache) UNUSUAL RASH, SWELLING OR PAIN  UNUSUAL VAGINAL DISCHARGE OR ITCHING   Items with * indicate a potential emergency and should be followed up as soon as possible or go to the Emergency Department if any problems should occur.  Please show the CHEMOTHERAPY ALERT CARD or  IMMUNOTHERAPY ALERT CARD at check-in to the Emergency Department and triage nurse.  Should you have questions after your visit or need to cancel or reschedule your appointment, please contact [facility] - A DEPT OF [facility]  [phone] and follow the prompts.  Office hours are 8:00 a.m. to 4:30 p.m. Monday - Friday. Please note that voicemails left after 4:00 p.m. may not be returned until the following business day.  We are closed weekends and major holidays. You have access to a nurse at all times for urgent questions. Please call the main number to the clinic [phone] and follow the prompts.  For any non-urgent questions, you may also contact your provider using MyChart. We now offer e-Visits for anyone 38 and older to request care online for non-urgent symptoms. For details visit [email or web address].   Also download the MyChart app! Go to the app store, search MyChart, open the app, select , and log in with your MyChart username and password.

## 2024-01-24 NOTE — Assessment & Plan Note (Signed)
 Continue Norco 5/325mg  Q4-6hour PRN.  Recommend to increase MS Contin  15 mg twice daily to every 8 hours.

## 2024-01-24 NOTE — Assessment & Plan Note (Addendum)
 Secondary to multiple myeloma/ chemotherapy  Monitor closely.

## 2024-01-24 NOTE — Progress Notes (Signed)
 Hematology/Oncology Progress note [phone] [phone]       CHIEF COMPLAINTS/PURPOSE OF CONSULTATION:  Multiple myeloma  ASSESSMENT & PLAN:   Neoplasm related pain Continue Norco 5/325mg  Q4-6hour PRN.  Recommend to increase MS Contin  15 mg twice daily to every 8 hours.   Multiple myeloma (HCC) Bone marrow biopsy results, PET scan findings, lab results were reviewed and discussed with patient. Diagnosis of IgA kappa multiple myeloma was reviewed and discussed.  Lab Results  Component Value Date   MPROTEIN 4.7 (H) 01/03/2024   KPAFRELGTCHN 1,540.0 (H) 01/03/2024   LAMBDASER 3.1 (L) 01/03/2024   KAPLAMBRATIO 496.77 (H) 01/03/2024    Normal Cytogenetics and multiple myeloma FISH testing results are pending. Patient is a transplant candidate.   Labs are reviewed and discussed with patient. Proceed with cycle 2 D1 Dara RVD.   Thrombosis prophylaxis, aspirin 81 mg daily. 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 S/p Zometa  4 mg x 1 -emergent use. Improved.   Macrocytic anemia Secondary to multiple myeloma/ chemotherapy  Monitor closely.   Orders Placed This Encounter  Procedures   Multiple Myeloma Panel (SPEP&IFE w/QIG)    Standing Status:   Future    Expected Date:   02/14/2024    Expiration Date:   02/13/2025   Kappa/lambda light chains    Standing Status:   Future    Expected Date:   02/14/2024    Expiration Date:   02/13/2025   Kappa/lambda light chains    Standing Status:   Future    Expected Date:   02/14/2024    Expiration Date:   02/13/2025   Multiple Myeloma Panel (SPEP&IFE w/QIG)    Standing Status:   Future    Expected Date:   02/14/2024    Expiration Date:   02/13/2025   CBC with Differential (Cancer Center Only)    Standing Status:   Future    Expected Date:   02/14/2024    Expiration Date:   02/13/2025   CMP (Cancer Center only)    Standing Status:   Future    Expected Date:   02/14/2024     Expiration Date:   02/13/2025   CBC with Differential (Cancer Center Only)    Standing Status:   Future    Expected Date:   02/21/2024    Expiration Date:   02/20/2025   CMP (Cancer Center only)    Standing Status:   Future    Expected Date:   02/21/2024    Expiration Date:   02/20/2025   CBC with Differential (Cancer Center Only)    Standing Status:   Future    Expected Date:   02/28/2024    Expiration Date:   02/27/2025   Follow-up per LOS All questions were answered. The patient knows to call the clinic with any problems, questions or concerns.  Zelphia Cap, MD, PhD [facility] Hematology Oncology 01/24/2024    HISTORY OF PRESENTING ILLNESS:  Dana Wallace 68 y.o. female presents to establish care for metastatic bone lesions.   Oncology History  Multiple myeloma (HCC)  12/09/2023 Initial Diagnosis   Multiple myeloma (HCC)  She initially experienced chest discomfort starting in her left shoulder in February, which persisted and led her to seek medical attention in April. Despite a comprehensive workup including a chest x-ray, EKG, echocardiogram, mammogram, and a DEXA scan, all results were normal.  In May, she had two follow-up visits with her primary care physician. The discomfort was primarily located behind  her left breast and later moved to her right clavicle and sternum area, becoming very painful and tender to touch. An x-ray at [facility] was performed, and the patient was told there was some type of lesion, after which she underwent an MRI of the right clavicle, shoulder, and scapula. Interval marrow biopsy, lab workup and PET scan. She was found to have stage II IgA kappa multiple myeloma.   12/15/2023 Imaging   PET scan showed  1. Diffuse and extensive lytic bone lesions throughout the axial and appendicular skeleton most likely reflecting multiple myeloma. 2. Pathologic fracture involving the right clavicle. 3. Large soft tissue masses associated with the left  second anterior rib and the lower sternum. 4. No findings for a primary neoplastic process involving the neck, chest, abdomen or pelvis   12/18/2023 Bone Marrow Biopsy   Pulmonary biopsy aspirate, clot, core Showed hypercellular bone marrow, involved by kappa restricted plasma cell neoplasm compromising 30-100%, average 70%, of the cellular marrow.   12/25/2023 Cancer Staging   Staging form: Plasma Cell Myeloma and Plasma Cell Disorders, AJCC 8th Edition - Clinical stage from 12/25/2023: Beta-2 -microglobulin (mg/L): 3.9, Albumin (g/dL): 3.2, ISS: Stage II, LDH: Normal - Signed by Babara Call, MD on 12/25/2023 Stage prefix: Initial diagnosis Beta 2 microglobulin range (mg/L): 3.5 to 5.49 Albumin range (g/dL): Less than 3.5   1/84/7974 -  Chemotherapy   Patient is on Treatment Plan : MYELOMA NEWLY DIAGNOSED TRANSPLANT CANDIDATE DaraVRd (Daratumumab  SQ) q21d x 6 Cycles (Induction/Consolidation)       She has pain of upper chest and back, right shoulder, and the left hip.   Patient currently takes Norco as needed as well as MS Contin  15 mg Q8h  She takes about 5-6 Norco per 24 hours.  Slightly more pain last week comparing to prior weeks. Right shoulder. Pain medication helps.  Accompanied by husband.  MEDICAL HISTORY:  Past Medical History:  Diagnosis Date   Anxiety    Diabetes mellitus without complication (HCC)    Hypertension     SURGICAL HISTORY: Past Surgical History:  Procedure Laterality Date   CESAREAN SECTION     CHOLECYSTECTOMY     IR BONE MARROW BIOPSY & ASPIRATION  12/18/2023   TUBAL LIGATION      SOCIAL HISTORY: Social History   Socioeconomic History   Marital status: Married    Spouse name: Not on file   Number of children: Not on file   Years of education: Not on file   Highest education level: Bachelor's degree (e.g., BA, AB, BS)  Occupational History   Not on file  Tobacco Use   Smoking status: Never   Smokeless tobacco: Never  Substance and Sexual Activity    Alcohol use: No   Drug use: Never   Sexual activity: Not Currently    Birth control/protection: None  Other Topics Concern   Not on file  Social History Narrative   Not on file   Social Drivers of Health   Financial Resource Strain: Low Risk  (01/19/2024)   Overall Financial Resource Strain (CARDIA)    Difficulty of Paying Living Expenses: Not hard at all  Food Insecurity: No Food Insecurity (01/19/2024)   Hunger Vital Sign    Worried About Running Out of Food in the Last Year: Never true    Ran Out of Food in the Last Year: Never true  Transportation Needs: No Transportation Needs (01/19/2024)   PRAPARE - Administrator, Civil Service (Medical): No  Lack of Transportation (Non-Medical): No  Physical Activity: Inactive (01/19/2024)   Exercise Vital Sign    Days of Exercise per Week: 0 days    Minutes of Exercise per Session: Not on file  Stress: Stress Concern Present (01/19/2024)   [employer] of Occupational Health - Occupational Stress Questionnaire    Feeling of Stress: To some extent  Social Connections: Moderately Isolated (01/19/2024)   Social Connection and Isolation Panel    Frequency of Communication with Friends and Family: More than three times a week    Frequency of Social Gatherings with Friends and Family: Once a week    Attends Religious Services: Patient declined    Database administrator or Organizations: No    Attends Engineer, structural: Not on file    Marital Status: Married  Catering manager Violence: Not At Risk (12/09/2023)   Humiliation, Afraid, Rape, and Kick questionnaire    Fear of Current or Ex-Partner: No    Emotionally Abused: No    Physically Abused: No    Sexually Abused: No    FAMILY HISTORY: Family History  Problem Relation Age of Onset   COPD Mother    Multiple sclerosis Mother    Varicose Veins Mother    Hodgkin's lymphoma Sister    Cancer Sister    Breast cancer Neg Hx     ALLERGIES:  has no known  allergies.  MEDICATIONS:  Current Outpatient Medications  Medication Sig Dispense Refill   acyclovir  (ZOVIRAX ) 400 MG tablet Take 1 tablet (400 mg total) by mouth 2 (two) times daily. 60 tablet 3   albuterol  (VENTOLIN  HFA) 108 (90 Base) MCG/ACT inhaler Inhale 2 puffs into the lungs every 6 (six) hours as needed for wheezing or shortness of breath. 8 g 2   ALPRAZolam  (XANAX ) 0.5 MG tablet Take 0.5-1 tablets (0.25-0.5 mg total) by mouth 2 (two) times daily as needed for anxiety. 30 tablet 0   aspirin 81 MG tablet Take by mouth.     Blood Glucose Monitoring Suppl (ONE TOUCH ULTRA 2) w/Device KIT 1 each by Does not apply route daily at 2 PM. 1 kit 0   glipiZIDE  (GLUCOTROL  XL) 5 MG 24 hr tablet TAKE 1 TABLET(5 MG) BY MOUTH DAILY WITH BREAKFAST 90 tablet 1   glucose blood (ONETOUCH ULTRA) test strip 1 each by Other route daily. Use as instructed 100 each 12   HYDROcodone -acetaminophen  (NORCO/VICODIN) 5-325 MG tablet Take 1-2 tablets by mouth every 4 (four) hours as needed for moderate pain (pain score 4-6). 60 tablet 0   latanoprost (XALATAN) 0.005 % ophthalmic solution 1 drop at bedtime.     lenalidomide  (REVLIMID ) 25 MG capsule Take 1 capsule (25 mg total) by mouth daily. Take for 14 days, then hold for 7 days. Repeat every 21 days. 14 capsule 0   metFORMIN  (GLUCOPHAGE ) 1000 MG tablet Take 1 tablet (1,000 mg total) by mouth 2 (two) times daily with a meal. 180 tablet 3   montelukast  (SINGULAIR ) 10 MG tablet Take 1 tablet (10 mg total) by mouth daily as needed (wheezing). 10 tablet 0   morphine  (MS CONTIN ) 15 MG 12 hr tablet Take 1 tablet (15 mg total) by mouth every 8 (eight) hours. 90 tablet 0   ondansetron  (ZOFRAN ) 8 MG tablet Take 1 tablet (8 mg total) by mouth every 8 (eight) hours as needed for nausea or vomiting. 30 tablet 1   simvastatin  (ZOCOR ) 10 MG tablet TAKE 1 TABLET(10 MG) BY MOUTH DAILY AT 6 PM  90 tablet 2   naloxone  (NARCAN ) nasal spray 4 mg/0.1 mL SPRAY 1 SPRAY INTO ONE NOSTRIL AS  DIRECTED FOR OPIOID OVERDOSE (TURN PERSON ON SIDE AFTER DOSE. IF NO RESPONSE IN 2-3 MINUTES OR PERSON RESPONDS BUT RELAPSES, REPEAT USING A NEW SPRAY DEVICE AND SPRAY INTO THE OTHER NOSTRIL. CALL 911 AFTER USE.) * EMERGENCY USE ONLY * (Patient not taking: Reported on 01/24/2024) 1 each 0   No current facility-administered medications for this visit.    Review of Systems  Constitutional:  Positive for fatigue. Negative for appetite change, chills and fever.  HENT:   Negative for hearing loss and voice change.   Eyes:  Negative for eye problems.  Respiratory:  Negative for chest tightness and cough.   Cardiovascular:  Negative for chest pain.  Gastrointestinal:  Negative for abdominal distention, abdominal pain and blood in stool.  Endocrine: Negative for hot flashes.  Genitourinary:  Negative for difficulty urinating and frequency.   Musculoskeletal:  Positive for arthralgias.       Shoulder, upper chest, hip pain  Skin:  Negative for itching and rash.  Neurological:  Negative for extremity weakness.  Hematological:  Negative for adenopathy.  Psychiatric/Behavioral:  Negative for confusion.      PHYSICAL EXAMINATION: ECOG PERFORMANCE STATUS: 1 - Symptomatic but completely ambulatory  Vitals:   01/24/24 0854  BP: (!) 130/52  Pulse: 84  Resp: 18  Temp: 98 F (36.7 C)  SpO2: 99%   Filed Weights   01/24/24 0854  Weight: 181 lb 6.4 oz (82.3 kg)    Physical Exam Constitutional:      General: She is not in acute distress.    Appearance: She is not diaphoretic.  HENT:     Head: Normocephalic and atraumatic.  Eyes:     General: No scleral icterus. Cardiovascular:     Rate and Rhythm: Normal rate. Rhythm irregular.     Heart sounds: No murmur heard. Pulmonary:     Effort: Pulmonary effort is normal. No respiratory distress.     Breath sounds: Normal breath sounds. No wheezing.  Abdominal:     General: Bowel sounds are normal. There is no distension.     Palpations: Abdomen  is soft.     Tenderness: There is no abdominal tenderness.  Musculoskeletal:        General: Normal range of motion.     Cervical back: Normal range of motion and neck supple.  Skin:    General: Skin is warm and dry.     Findings: No erythema.  Neurological:     Mental Status: She is alert and oriented to person, place, and time. Mental status is at baseline.     Cranial Nerves: No cranial nerve deficit.     Motor: No abnormal muscle tone.     Coordination: Coordination normal.  Psychiatric:        Mood and Affect: Mood and affect normal.      LABORATORY DATA:  I have reviewed the data as listed    Latest Ref Rng & Units 01/24/2024    8:34 AM 01/17/2024    8:44 AM 01/14/2024    8:18 AM  CBC  WBC 4.0 - 10.5 K/uL 4.9  3.3  3.4   Hemoglobin 12.0 - 15.0 g/dL 9.1  9.3  9.2   Hematocrit 36.0 - 46.0 % 28.5  29.1  28.2   Platelets 150 - 400 K/uL 264  66  69       Latest Ref Rng &  Units 01/24/2024    8:34 AM 01/17/2024    8:44 AM 01/14/2024    8:17 AM  CMP  Glucose 70 - 99 mg/dL 88  877  880   BUN 8 - 23 mg/dL 10  19  27    Creatinine 0.44 - 1.00 mg/dL 9.47  9.26  9.15   Sodium 135 - 145 mmol/L 138  133  136   Potassium 3.5 - 5.1 mmol/L 4.5  4.9  5.4   Chloride 98 - 111 mmol/L 104  97  98   CO2 22 - 32 mmol/L 25  26  27    Calcium  8.9 - 10.3 mg/dL 8.7  8.7  8.9   Total Protein 6.5 - 8.1 g/dL 7.1  8.1    Total Bilirubin 0.0 - 1.2 mg/dL 0.4  0.5    Alkaline Phos 38 - 126 U/L 138  79    AST 15 - 41 U/L 19  35    ALT 0 - 44 U/L 17  14       RADIOGRAPHIC STUDIES: I have personally reviewed the radiological images as listed and agreed with the findings in the report. DG Chest 2 View Result Date: 01/10/2024 CLINICAL DATA:  Bradycardia. EXAM: CHEST - 2 VIEW COMPARISON:  PET scan of December 13, 2023. Radiograph of August 30, 2023. FINDINGS: Stable cardiomediastinal silhouette. Pleural based mass is noted in left upper lobe laterally consistent with multiple myeloma as noted on prior PET scan.  No acute consolidative process is noted. IMPRESSION: Pleural based mass is noted in left upper lobe laterally consistent with multiple myeloma as noted on prior PET scan. Electronically Signed   By: Lynwood Landy Raddle M.D.   On: 01/10/2024 14:29

## 2024-01-24 NOTE — Progress Notes (Signed)
 Patient here for follow up. Reports increased pain across chest, back, shoulders. Pain 5/10 today and took hydrocodone  this morning. Pt reports having to take more pain medication due to increased pain.

## 2024-01-24 NOTE — Assessment & Plan Note (Signed)
 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 S/p Zometa  4 mg x 1 -emergent use. Improved.

## 2024-01-27 ENCOUNTER — Inpatient Hospital Stay

## 2024-01-27 ENCOUNTER — Other Ambulatory Visit: Payer: Self-pay | Admitting: Family Medicine

## 2024-01-27 VITALS — BP 106/65 | HR 75 | Temp 98.0°F | Resp 18

## 2024-01-27 DIAGNOSIS — C9000 Multiple myeloma not having achieved remission: Secondary | ICD-10-CM

## 2024-01-27 DIAGNOSIS — Z5111 Encounter for antineoplastic chemotherapy: Secondary | ICD-10-CM | POA: Diagnosis not present

## 2024-01-27 LAB — KAPPA/LAMBDA LIGHT CHAINS
Kappa free light chain: 160.8 mg/L — ABNORMAL HIGH (ref 3.3–19.4)
Kappa, lambda light chain ratio: 41.23 — ABNORMAL HIGH (ref 0.26–1.65)
Lambda free light chains: 3.9 mg/L — ABNORMAL LOW (ref 5.7–26.3)

## 2024-01-27 MED ORDER — PROCHLORPERAZINE MALEATE 10 MG PO TABS
10.0000 mg | ORAL_TABLET | Freq: Once | ORAL | Status: AC
  Administered 2024-01-27: 10 mg via ORAL
  Filled 2024-01-27: qty 1

## 2024-01-27 MED ORDER — BORTEZOMIB CHEMO SQ INJECTION 3.5 MG (2.5MG/ML)
1.3000 mg/m2 | Freq: Once | INTRAMUSCULAR | Status: AC
  Administered 2024-01-27: 2.5 mg via SUBCUTANEOUS
  Filled 2024-01-27: qty 1

## 2024-01-27 MED ORDER — DIPHENHYDRAMINE HCL 25 MG PO CAPS
50.0000 mg | ORAL_CAPSULE | Freq: Once | ORAL | Status: AC
  Administered 2024-01-27: 50 mg via ORAL
  Filled 2024-01-27: qty 2

## 2024-01-27 NOTE — Patient Instructions (Signed)
 [facility] - A DEPT OF [facility]  Discharge Instructions: Thank you for choosing [facility] to provide your oncology and hematology care.  If you have a lab appointment with the Cancer Center, please go directly to the Cancer Center and check in at the registration area.  Wear comfortable clothing and clothing appropriate for easy access to any Portacath or PICC line.   We strive to give you quality time with your provider. You may need to reschedule your appointment if you arrive late (15 or more minutes).  Arriving late affects you and other patients whose appointments are after yours.  Also, if you miss three or more appointments without notifying the office, you may be dismissed from the clinic at the provider's discretion.      For prescription refill requests, have your pharmacy contact our office and allow 72 hours for refills to be completed.    Today you received the following chemotherapy and/or immunotherapy agents- velcade      To help prevent nausea and vomiting after your treatment, we encourage you to take your nausea medication as directed.  BELOW ARE SYMPTOMS THAT SHOULD BE REPORTED IMMEDIATELY: *FEVER GREATER THAN 100.4 F (38 C) OR HIGHER *CHILLS OR SWEATING *NAUSEA AND VOMITING THAT IS NOT CONTROLLED WITH YOUR NAUSEA MEDICATION *UNUSUAL SHORTNESS OF BREATH *UNUSUAL BRUISING OR BLEEDING *URINARY PROBLEMS (pain or burning when urinating, or frequent urination) *BOWEL PROBLEMS (unusual diarrhea, constipation, pain near the anus) TENDERNESS IN MOUTH AND THROAT WITH OR WITHOUT PRESENCE OF ULCERS (sore throat, sores in mouth, or a toothache) UNUSUAL RASH, SWELLING OR PAIN  UNUSUAL VAGINAL DISCHARGE OR ITCHING   Items with * indicate a potential emergency and should be followed up as soon as possible or go to the Emergency Department if any problems should occur.  Please show the CHEMOTHERAPY ALERT CARD or IMMUNOTHERAPY  ALERT CARD at check-in to the Emergency Department and triage nurse.  Should you have questions after your visit or need to cancel or reschedule your appointment, please contact [facility] - A DEPT OF [facility]  [phone] and follow the prompts.  Office hours are 8:00 a.m. to 4:30 p.m. Monday - Friday. Please note that voicemails left after 4:00 p.m. may not be returned until the following business day.  We are closed weekends and major holidays. You have access to a nurse at all times for urgent questions. Please call the main number to the clinic [phone] and follow the prompts.  For any non-urgent questions, you may also contact your provider using MyChart. We now offer e-Visits for anyone 47 and older to request care online for non-urgent symptoms. For details visit [email or web address].   Also download the MyChart app! Go to the app store, search "MyChart", open the app, select [place], and log in with your MyChart username and password.

## 2024-01-27 NOTE — Telephone Encounter (Unsigned)
 Copied from CRM #[id number]. Topic: Clinical - Medication Refill >> Jan 27, 2024 11:06 AM Amy B wrote: Medication:  metFORMIN  (GLUCOPHAGE ) 1000 MG tablet 90-day refill  Has the patient contacted their pharmacy? Yes (Agent: If no, request that the patient contact the pharmacy for the refill. If patient does not wish to contact the pharmacy document the reason why and proceed with request.) (Agent: If yes, when and what did the pharmacy advise?)  This is the patient's preferred pharmacy:  [id number] [place], [place] - [address] AT [facility] OF [address] [place] [address] [place] [address] Phone: [phone] Fax: [phone]  Is this the correct pharmacy for this prescription? Yes If no, delete pharmacy and type the correct one.   Has the prescription been filled recently? No  Is the patient out of the medication? Yes  Has the patient been seen for an appointment in the last year OR does the patient have an upcoming appointment? Yes  Can we respond through MyChart? Yes  Agent: Please be advised that Rx refills may take up to 3 business days. We ask that you follow-up with your pharmacy.

## 2024-01-28 ENCOUNTER — Encounter: Payer: Self-pay | Admitting: Family Medicine

## 2024-01-28 DIAGNOSIS — E782 Mixed hyperlipidemia: Secondary | ICD-10-CM

## 2024-01-28 DIAGNOSIS — E1165 Type 2 diabetes mellitus with hyperglycemia: Secondary | ICD-10-CM

## 2024-01-28 LAB — MULTIPLE MYELOMA PANEL, SERUM
Albumin SerPl Elph-Mcnc: 3 g/dL (ref 2.9–4.4)
Albumin/Glob SerPl: 0.8 (ref 0.7–1.7)
Alpha 1: 0.3 g/dL (ref 0.0–0.4)
Alpha2 Glob SerPl Elph-Mcnc: 0.8 g/dL (ref 0.4–1.0)
B-Globulin SerPl Elph-Mcnc: 2.3 g/dL — ABNORMAL HIGH (ref 0.7–1.3)
Gamma Glob SerPl Elph-Mcnc: 0.3 g/dL — ABNORMAL LOW (ref 0.4–1.8)
Globulin, Total: 3.8 g/dL (ref 2.2–3.9)
IgA: 1692 mg/dL — ABNORMAL HIGH (ref 87–352)
IgG (Immunoglobin G), Serum: 324 mg/dL — ABNORMAL LOW (ref 586–1602)
IgM (Immunoglobulin M), Srm: 8 mg/dL — ABNORMAL LOW (ref 26–217)
M Protein SerPl Elph-Mcnc: 1.7 g/dL — ABNORMAL HIGH
Total Protein ELP: 6.8 g/dL (ref 6.0–8.5)

## 2024-01-28 MED ORDER — SIMVASTATIN 10 MG PO TABS: ORAL_TABLET | ORAL | 2 refills | Status: AC

## 2024-01-28 MED ORDER — HYDROCHLOROTHIAZIDE 12.5 MG PO TABS: 12.5000 mg | ORAL_TABLET | Freq: Every day | ORAL | 3 refills | Status: AC

## 2024-01-28 MED ORDER — GLIPIZIDE ER 5 MG PO TB24: ORAL_TABLET | ORAL | 1 refills | Status: DC

## 2024-01-28 MED ORDER — METFORMIN HCL 1000 MG PO TABS: 1000.0000 mg | ORAL_TABLET | Freq: Two times a day (BID) | ORAL | 3 refills | Status: AC

## 2024-01-31 ENCOUNTER — Inpatient Hospital Stay

## 2024-01-31 ENCOUNTER — Inpatient Hospital Stay: Admitting: Oncology

## 2024-01-31 VITALS — BP 133/47 | HR 85 | Temp 97.4°F | Resp 18 | Ht 60.0 in | Wt 175.0 lb

## 2024-01-31 DIAGNOSIS — C9000 Multiple myeloma not having achieved remission: Secondary | ICD-10-CM

## 2024-01-31 DIAGNOSIS — Z5112 Encounter for antineoplastic immunotherapy: Secondary | ICD-10-CM | POA: Diagnosis not present

## 2024-01-31 DIAGNOSIS — Z5111 Encounter for antineoplastic chemotherapy: Secondary | ICD-10-CM | POA: Diagnosis not present

## 2024-01-31 DIAGNOSIS — Z807 Family history of other malignant neoplasms of lymphoid, hematopoietic and related tissues: Secondary | ICD-10-CM | POA: Diagnosis not present

## 2024-01-31 LAB — CBC WITH DIFFERENTIAL (CANCER CENTER ONLY)
Abs Immature Granulocytes: 0.02 K/uL (ref 0.00–0.07)
Basophils Absolute: 0 K/uL (ref 0.0–0.1)
Basophils Relative: 0 %
Eosinophils Absolute: 0.1 K/uL (ref 0.0–0.5)
Eosinophils Relative: 3 %
HCT: 30.7 % — ABNORMAL LOW (ref 36.0–46.0)
Hemoglobin: 9.9 g/dL — ABNORMAL LOW (ref 12.0–15.0)
Immature Granulocytes: 1 %
Lymphocytes Relative: 10 %
Lymphs Abs: 0.4 K/uL — ABNORMAL LOW (ref 0.7–4.0)
MCH: 32.5 pg (ref 26.0–34.0)
MCHC: 32.2 g/dL (ref 30.0–36.0)
MCV: 100.7 fL — ABNORMAL HIGH (ref 80.0–100.0)
Monocytes Absolute: 0.4 K/uL (ref 0.1–1.0)
Monocytes Relative: 10 %
Neutro Abs: 3.1 K/uL (ref 1.7–7.7)
Neutrophils Relative %: 76 %
Platelet Count: 118 K/uL — ABNORMAL LOW (ref 150–400)
RBC: 3.05 MIL/uL — ABNORMAL LOW (ref 3.87–5.11)
RDW: 15 % (ref 11.5–15.5)
WBC Count: 4.1 K/uL (ref 4.0–10.5)
nRBC: 0 % (ref 0.0–0.2)

## 2024-01-31 LAB — CMP (CANCER CENTER ONLY)
ALT: 14 U/L (ref 0–44)
AST: 18 U/L (ref 15–41)
Albumin: 3.5 g/dL (ref 3.5–5.0)
Alkaline Phosphatase: 142 U/L — ABNORMAL HIGH (ref 38–126)
Anion gap: 7 (ref 5–15)
BUN: 8 mg/dL (ref 8–23)
CO2: 28 mmol/L (ref 22–32)
Calcium: 8.7 mg/dL — ABNORMAL LOW (ref 8.9–10.3)
Chloride: 101 mmol/L (ref 98–111)
Creatinine: 0.54 mg/dL (ref 0.44–1.00)
GFR, Estimated: >60 mL/min (ref >60)
Glucose, Bld: 115 mg/dL — ABNORMAL HIGH (ref 70–99)
Potassium: 4.1 mmol/L (ref 3.5–5.1)
Sodium: 136 mmol/L (ref 135–145)
Total Bilirubin: 0.7 mg/dL (ref 0.0–1.2)
Total Protein: 6.8 g/dL (ref 6.5–8.1)

## 2024-01-31 MED ORDER — DEXAMETHASONE 4 MG PO TABS
20.0000 mg | ORAL_TABLET | Freq: Once | ORAL | Status: AC
  Administered 2024-01-31: 20 mg via ORAL
  Filled 2024-01-31: qty 5

## 2024-01-31 MED ORDER — ACETAMINOPHEN 325 MG PO TABS
650.0000 mg | ORAL_TABLET | Freq: Once | ORAL | Status: AC
  Administered 2024-01-31: 650 mg via ORAL
  Filled 2024-01-31: qty 2

## 2024-01-31 MED ORDER — DIPHENHYDRAMINE HCL 25 MG PO CAPS
50.0000 mg | ORAL_CAPSULE | Freq: Once | ORAL | Status: AC
  Administered 2024-01-31: 50 mg via ORAL
  Filled 2024-01-31: qty 2

## 2024-01-31 MED ORDER — DARATUMUMAB-HYALURONIDASE-FIHJ 1800-30000 MG-UT/15ML ~~LOC~~ SOLN
1800.0000 mg | Freq: Once | SUBCUTANEOUS | Status: AC
  Administered 2024-01-31: 1800 mg via SUBCUTANEOUS
  Filled 2024-01-31: qty 15

## 2024-01-31 MED ORDER — BORTEZOMIB CHEMO SQ INJECTION 3.5 MG (2.5MG/ML)
1.3000 mg/m2 | Freq: Once | INTRAMUSCULAR | Status: AC
  Administered 2024-01-31: 2.5 mg via SUBCUTANEOUS
  Filled 2024-01-31: qty 1

## 2024-01-31 NOTE — Patient Instructions (Signed)
 [facility] - A DEPT OF [facility]  Discharge Instructions: Thank you for choosing [facility] to provide your oncology and hematology care.  If you have a lab appointment with the Cancer Center, please go directly to the Cancer Center and check in at the registration area.  Wear comfortable clothing and clothing appropriate for easy access to any Portacath or PICC line.   We strive to give you quality time with your provider. You may need to reschedule your appointment if you arrive late (15 or more minutes).  Arriving late affects you and other patients whose appointments are after yours.  Also, if you miss three or more appointments without notifying the office, you may be dismissed from the clinic at the provider's discretion.      For prescription refill requests, have your pharmacy contact our office and allow 72 hours for refills to be completed.    Today you received the following chemotherapy and/or immunotherapy agents VELCADE  and DARZALEX       To help prevent nausea and vomiting after your treatment, we encourage you to take your nausea medication as directed.  BELOW ARE SYMPTOMS THAT SHOULD BE REPORTED IMMEDIATELY: *FEVER GREATER THAN 100.4 F (38 C) OR HIGHER *CHILLS OR SWEATING *NAUSEA AND VOMITING THAT IS NOT CONTROLLED WITH YOUR NAUSEA MEDICATION *UNUSUAL SHORTNESS OF BREATH *UNUSUAL BRUISING OR BLEEDING *URINARY PROBLEMS (pain or burning when urinating, or frequent urination) *BOWEL PROBLEMS (unusual diarrhea, constipation, pain near the anus) TENDERNESS IN MOUTH AND THROAT WITH OR WITHOUT PRESENCE OF ULCERS (sore throat, sores in mouth, or a toothache) UNUSUAL RASH, SWELLING OR PAIN  UNUSUAL VAGINAL DISCHARGE OR ITCHING   Items with * indicate a potential emergency and should be followed up as soon as possible or go to the Emergency Department if any problems should occur.  Please show the CHEMOTHERAPY ALERT CARD or  IMMUNOTHERAPY ALERT CARD at check-in to the Emergency Department and triage nurse.  Should you have questions after your visit or need to cancel or reschedule your appointment, please contact [facility] - A DEPT OF [facility]  [phone] and follow the prompts.  Office hours are 8:00 a.m. to 4:30 p.m. Monday - Friday. Please note that voicemails left after 4:00 p.m. may not be returned until the following business day.  We are closed weekends and major holidays. You have access to a nurse at all times for urgent questions. Please call the main number to the clinic [phone] and follow the prompts.  For any non-urgent questions, you may also contact your provider using MyChart. We now offer e-Visits for anyone 59 and older to request care online for non-urgent symptoms. For details visit [email or web address].   Also download the MyChart app! Go to the app store, search MyChart, open the app, select [facility], and log in with your MyChart username and password.  Bortezomib  Injection What is this medication? BORTEZOMIB  (bor TEZ oh mib) treats lymphoma. It may also be used to treat multiple myeloma, a type of bone marrow cancer. It works by blocking a protein that causes cancer cells to grow and multiply. This helps to slow or stop the spread of cancer cells. This medicine may be used for other purposes; ask your health care provider or pharmacist if you have questions. COMMON BRAND NAME(S): BORUZU , Velcade  What should I tell my care team before I take this medication? They need to know if you have  any of these conditions: Dehydration Diabetes Heart disease Liver disease Tingling of the fingers or toes or other nerve disorder An unusual or allergic reaction to bortezomib , other medications, foods, dyes, or preservatives If you or your partner are pregnant or trying to get pregnant Breastfeeding How should I use this medication? This medication is  injected into a vein or under the skin. It is given by your care team in a hospital or clinic setting. Talk to your care team about the use of this medication in children. Special care may be needed. Overdosage: If you think you have taken too much of this medicine contact a poison control center or emergency room at once. NOTE: This medicine is only for you. Do not share this medicine with others. What if I miss a dose? Keep appointments for follow-up doses. It is important not to miss your dose. Call your care team if you are unable to keep an appointment. What may interact with this medication? Ketoconazole Rifampin This list may not describe all possible interactions. Give your health care provider a list of all the medicines, herbs, non-prescription drugs, or dietary supplements you use. Also tell them if you smoke, drink alcohol, or use illegal drugs. Some items may interact with your medicine. What should I watch for while using this medication? Your condition will be monitored carefully while you are receiving this medication. You may need blood work while taking this medication. This medication may affect your coordination, reaction time, or judgment. Do not drive or operate machinery until you know how this medication affects you. Sit up or stand slowly to reduce the risk of dizzy or fainting spells. Drinking alcohol with this medication can increase the risk of these side effects. This medication may increase your risk of getting an infection. Call your care team for advice if you get a fever, chills, sore throat, or other symptoms of a cold or flu. Do not treat yourself. Try to avoid being around people who are sick. Check with your care team if you have severe diarrhea, nausea, and vomiting, or if you sweat a lot. The loss of too much body fluid may make it dangerous for you to take this medication. Talk to your care team if you may be pregnant. Serious birth defects can occur if you take  this medication during pregnancy and for 7 months after the last dose. You will need a negative pregnancy test before starting this medication. Contraception is recommended while taking this medication and for 7 months after the last dose. Your care team can help you find the option that works for you. If your partner can get pregnant, use a condom during sex while taking this medication and for 4 months after the last dose. Do not breastfeed while taking this medication and for 2 months after the last dose. This medication may cause infertility. Talk to your care team if you are concerned about your fertility. What side effects may I notice from receiving this medication? Side effects that you should report to your care team as soon as possible: Allergic reactions--skin rash, itching, hives, swelling of the face, lips, tongue, or throat Bleeding--bloody or black, tar-like stools, vomiting blood or brown material that looks like coffee grounds, red or dark brown urine, small red or purple spots on skin, unusual bruising or bleeding Bleeding in the brain--severe headache, stiff neck, confusion, dizziness, change in vision, numbness or weakness of the face, arm, or leg, trouble speaking, trouble walking, vomiting Bowel blockage--stomach cramping,  unable to have a bowel movement or pass gas, loss of appetite, vomiting Heart failure--shortness of breath, swelling of the ankles, feet, or hands, sudden weight gain, unusual weakness or fatigue Infection--fever, chills, cough, sore throat, wounds that don't heal, pain or trouble when passing urine, general feeling of discomfort or being unwell Liver injury--right upper belly pain, loss of appetite, nausea, light-colored stool, dark yellow or brown urine, yellowing skin or eyes, unusual weakness or fatigue Low blood pressure--dizziness, feeling faint or lightheaded, blurry vision Lung injury--shortness of breath or trouble breathing, cough, spitting up blood,  chest pain, fever Pain, tingling, or numbness in the hands or feet Severe or prolonged diarrhea Stomach pain, bloody diarrhea, pale skin, unusual weakness or fatigue, decrease in the amount of urine, which may be signs of hemolytic uremic syndrome Sudden and severe headache, confusion, change in vision, seizures, which may be signs of posterior reversible encephalopathy syndrome (PRES) TTP--purple spots on the skin or inside the mouth, pale skin, yellowing skin or eyes, unusual weakness or fatigue, fever, fast or irregular heartbeat, confusion, change in vision, trouble speaking, trouble walking Tumor lysis syndrome (TLS)--nausea, vomiting, diarrhea, decrease in the amount of urine, dark urine, unusual weakness or fatigue, confusion, muscle pain or cramps, fast or irregular heartbeat, joint pain Side effects that usually do not require medical attention (report to your care team if they continue or are bothersome): Constipation Diarrhea Fatigue Loss of appetite Nausea This list may not describe all possible side effects. Call your doctor for medical advice about side effects. You may report side effects to FDA at 1-800-FDA-1088. Where should I keep my medication? This medication is given in a hospital or clinic. It will not be stored at home. NOTE: This sheet is a summary. It may not cover all possible information. If you have questions about this medicine, talk to your doctor, pharmacist, or health care provider.  2024 Elsevier/Gold Standard (2021-10-10 00:00:00)  Daratumumab  Injection What is this medication? DARATUMUMAB  (dar a toom ue mab) treats multiple myeloma, a type of bone marrow cancer. It works by helping your immune system slow or stop the spread of cancer cells. It is a monoclonal antibody. This medicine may be used for other purposes; ask your health care provider or pharmacist if you have questions. COMMON BRAND NAME(S): DARZALEX  What should I tell my care team before I take  this medication? They need to know if you have any of these conditions: Hereditary fructose intolerance Infection, such as chickenpox, herpes, hepatitis B Lung or breathing disease, such as asthma, COPD An unusual or allergic reaction to daratumumab , sorbitol, other medications, foods, dyes, or preservatives Pregnant or trying to get pregnant Breastfeeding How should I use this medication? This medication is injected into a vein. It is given by your care team in a hospital or clinic setting. Talk to your care team about the use of this medication in children. Special care may be needed. Overdosage: If you think you have taken too much of this medicine contact a poison control center or emergency room at once. NOTE: This medicine is only for you. Do not share this medicine with others. What if I miss a dose? Keep appointments for follow-up doses. It is important not to miss your dose. Call your care team if you are unable to keep an appointment. What may interact with this medication? Interactions have not been studied. This list may not describe all possible interactions. Give your health care provider a list of all the medicines, herbs, non-prescription  drugs, or dietary supplements you use. Also tell them if you smoke, drink alcohol, or use illegal drugs. Some items may interact with your medicine. What should I watch for while using this medication? Your condition will be monitored carefully while you are receiving this medication. This medication can cause serious allergic reactions. To reduce your risk, your care team may give you other medication to take before receiving this one. Be sure to follow the directions from your care team. This medication can affect the results of blood tests to match your blood type. These changes can last for up to 6 months after the final dose. Your care team will do blood tests to match your blood type before you start treatment. Tell all of your care team  that you are being treated with this medication before receiving a blood transfusion. This medication can affect the results of some tests used to determine treatment response; extra tests may be needed to evaluate response. Talk to your care team if you wish to become pregnant or think you are pregnant. This medication can cause serious birth defects if taken during pregnancy and for 3 months after the last dose. A reliable form of contraception is recommended while taking this medication and for 3 months after the last dose. Talk to your care team about effective forms of contraception. Do not breast-feed while taking this medication. What side effects may I notice from receiving this medication? Side effects that you should report to your care team as soon as possible: Allergic reactions--skin rash, itching, hives, swelling of the face, lips, tongue, or throat Infection--fever, chills, cough, sore throat, wounds that don't heal, pain or trouble when passing urine, general feeling of discomfort or being unwell Infusion reactions--chest pain, shortness of breath or trouble breathing, feeling faint or lightheaded Unusual bruising or bleeding Side effects that usually do not require medical attention (report to your care team if they continue or are bothersome): Constipation Diarrhea Fatigue Nausea Pain, tingling, or numbness in the hands or feet Swelling of the ankles, hands, or feet This list may not describe all possible side effects. Call your doctor for medical advice about side effects. You may report side effects to FDA at 1-800-FDA-1088. Where should I keep my medication? This medication is given in a hospital or clinic. It will not be stored at home. NOTE: This sheet is a summary. It may not cover all possible information. If you have questions about this medicine, talk to your doctor, pharmacist, or health care provider.  2024 Elsevier/Gold Standard (2022-03-15 00:00:00)

## 2024-02-03 ENCOUNTER — Inpatient Hospital Stay

## 2024-02-03 ENCOUNTER — Encounter: Payer: Self-pay | Admitting: Oncology

## 2024-02-03 VITALS — BP 121/60 | HR 60 | Temp 98.8°F | Resp 17

## 2024-02-03 DIAGNOSIS — Z5112 Encounter for antineoplastic immunotherapy: Secondary | ICD-10-CM | POA: Diagnosis not present

## 2024-02-03 DIAGNOSIS — D539 Nutritional anemia, unspecified: Secondary | ICD-10-CM | POA: Diagnosis not present

## 2024-02-03 DIAGNOSIS — Z5111 Encounter for antineoplastic chemotherapy: Secondary | ICD-10-CM | POA: Diagnosis not present

## 2024-02-03 DIAGNOSIS — C9000 Multiple myeloma not having achieved remission: Secondary | ICD-10-CM

## 2024-02-03 DIAGNOSIS — Z807 Family history of other malignant neoplasms of lymphoid, hematopoietic and related tissues: Secondary | ICD-10-CM | POA: Diagnosis not present

## 2024-02-03 DIAGNOSIS — G893 Neoplasm related pain (acute) (chronic): Secondary | ICD-10-CM | POA: Diagnosis not present

## 2024-02-03 DIAGNOSIS — Z23 Encounter for immunization: Secondary | ICD-10-CM | POA: Diagnosis not present

## 2024-02-03 MED ORDER — PROCHLORPERAZINE MALEATE 10 MG PO TABS
10.0000 mg | ORAL_TABLET | Freq: Once | ORAL | Status: AC
  Administered 2024-02-03: 10 mg via ORAL
  Filled 2024-02-03: qty 1

## 2024-02-03 MED ORDER — BORTEZOMIB CHEMO SQ INJECTION 3.5 MG (2.5MG/ML)
1.3000 mg/m2 | Freq: Once | INTRAMUSCULAR | Status: AC
  Administered 2024-02-03: 2.5 mg via SUBCUTANEOUS
  Filled 2024-02-03: qty 1

## 2024-02-03 MED ORDER — DIPHENHYDRAMINE HCL 25 MG PO CAPS
50.0000 mg | ORAL_CAPSULE | Freq: Once | ORAL | Status: AC
  Administered 2024-02-03: 50 mg via ORAL
  Filled 2024-02-03: qty 2

## 2024-02-03 NOTE — Progress Notes (Signed)
 [facility] CSW Progress Note  Visual merchandiser met with patient and her husband in infusion to follow-up on emotional support.    Interventions: Provided brief mental health counseling with regard to adjusting to her illness.  She reports feeling much better than she has recently. Patient appeared to respond positively to life review.      Follow Up Plan:  CSW will follow-up with patient by phone     Macario CHRISTELLA Au, LCSW Clinical Social Worker [facility]

## 2024-02-03 NOTE — Progress Notes (Signed)
 Patient refused post monitoring

## 2024-02-03 NOTE — Patient Instructions (Signed)
 [facility] - A DEPT OF . [facility]  Discharge Instructions: Thank you for choosing [facility] to provide your oncology and hematology care.  If you have a lab appointment with the Cancer Center, please go directly to the Cancer Center and check in at the registration area.  Wear comfortable clothing and clothing appropriate for easy access to any Portacath or PICC line.   We strive to give you quality time with your provider. You may need to reschedule your appointment if you arrive late (15 or more minutes).  Arriving late affects you and other patients whose appointments are after yours.  Also, if you miss three or more appointments without notifying the office, you may be dismissed from the clinic at the provider's discretion.      For prescription refill requests, have your pharmacy contact our office and allow 72 hours for refills to be completed.    Today you received the following chemotherapy and/or immunotherapy agents : Vidazza    To help prevent nausea and vomiting after your treatment, we encourage you to take your nausea medication as directed.  BELOW ARE SYMPTOMS THAT SHOULD BE REPORTED IMMEDIATELY: *FEVER GREATER THAN 100.4 F (38 C) OR HIGHER *CHILLS OR SWEATING *NAUSEA AND VOMITING THAT IS NOT CONTROLLED WITH YOUR NAUSEA MEDICATION *UNUSUAL SHORTNESS OF BREATH *UNUSUAL BRUISING OR BLEEDING *URINARY PROBLEMS (pain or burning when urinating, or frequent urination) *BOWEL PROBLEMS (unusual diarrhea, constipation, pain near the anus) TENDERNESS IN MOUTH AND THROAT WITH OR WITHOUT PRESENCE OF ULCERS (sore throat, sores in mouth, or a toothache) UNUSUAL RASH, SWELLING OR PAIN  UNUSUAL VAGINAL DISCHARGE OR ITCHING   Items with * indicate a potential emergency and should be followed up as soon as possible or go to the Emergency Department if any problems should occur.  Please show the CHEMOTHERAPY ALERT CARD or IMMUNOTHERAPY  ALERT CARD at check-in to the Emergency Department and triage nurse.  Should you have questions after your visit or need to cancel or reschedule your appointment, please contact [facility] - A DEPT OF [facility]  [phone] and follow the prompts.  Office hours are 8:00 a.m. to 4:30 p.m. Monday - Friday. Please note that voicemails left after 4:00 p.m. may not be returned until the following business day.  We are closed weekends and major holidays. You have access to a nurse at all times for urgent questions. Please call the main number to the clinic [phone] and follow the prompts.  For any non-urgent questions, you may also contact your provider using MyChart. We now offer e-Visits for anyone 90 and older to request care online for non-urgent symptoms. For details visit [email or web address].   Also download the MyChart app! Go to the app store, search MyChart, open the app, select [place], and log in with your MyChart username and password.

## 2024-02-04 ENCOUNTER — Other Ambulatory Visit: Payer: Self-pay

## 2024-02-04 ENCOUNTER — Other Ambulatory Visit: Payer: Self-pay | Admitting: Oncology

## 2024-02-04 DIAGNOSIS — C9000 Multiple myeloma not having achieved remission: Secondary | ICD-10-CM

## 2024-02-04 MED ORDER — LENALIDOMIDE 25 MG PO CAPS
25.0000 mg | ORAL_CAPSULE | Freq: Every day | ORAL | 0 refills | Status: DC
Start: 2024-02-04 — End: 2024-02-26

## 2024-02-07 ENCOUNTER — Inpatient Hospital Stay

## 2024-02-07 ENCOUNTER — Inpatient Hospital Stay: Admitting: Oncology

## 2024-02-07 ENCOUNTER — Encounter: Payer: Self-pay | Admitting: Oncology

## 2024-02-07 VITALS — BP 136/59 | HR 89 | Temp 98.2°F | Resp 18 | Wt 174.3 lb

## 2024-02-07 DIAGNOSIS — Z23 Encounter for immunization: Secondary | ICD-10-CM

## 2024-02-07 DIAGNOSIS — M7989 Other specified soft tissue disorders: Secondary | ICD-10-CM | POA: Diagnosis not present

## 2024-02-07 DIAGNOSIS — D539 Nutritional anemia, unspecified: Secondary | ICD-10-CM

## 2024-02-07 DIAGNOSIS — D696 Thrombocytopenia, unspecified: Secondary | ICD-10-CM

## 2024-02-07 DIAGNOSIS — C9000 Multiple myeloma not having achieved remission: Secondary | ICD-10-CM

## 2024-02-07 DIAGNOSIS — K5900 Constipation, unspecified: Secondary | ICD-10-CM | POA: Diagnosis not present

## 2024-02-07 DIAGNOSIS — G893 Neoplasm related pain (acute) (chronic): Secondary | ICD-10-CM

## 2024-02-07 DIAGNOSIS — Z5111 Encounter for antineoplastic chemotherapy: Secondary | ICD-10-CM | POA: Diagnosis not present

## 2024-02-07 DIAGNOSIS — Z807 Family history of other malignant neoplasms of lymphoid, hematopoietic and related tissues: Secondary | ICD-10-CM | POA: Diagnosis not present

## 2024-02-07 DIAGNOSIS — Z5112 Encounter for antineoplastic immunotherapy: Secondary | ICD-10-CM | POA: Diagnosis not present

## 2024-02-07 LAB — CBC WITH DIFFERENTIAL (CANCER CENTER ONLY)
Abs Immature Granulocytes: 0.03 K/uL (ref 0.00–0.07)
Basophils Absolute: 0 K/uL (ref 0.0–0.1)
Basophils Relative: 0 %
Eosinophils Absolute: 0.2 K/uL (ref 0.0–0.5)
Eosinophils Relative: 3 %
HCT: 32.4 % — ABNORMAL LOW (ref 36.0–46.0)
Hemoglobin: 10.6 g/dL — ABNORMAL LOW (ref 12.0–15.0)
Immature Granulocytes: 1 %
Lymphocytes Relative: 8 %
Lymphs Abs: 0.5 K/uL — ABNORMAL LOW (ref 0.7–4.0)
MCH: 32.2 pg (ref 26.0–34.0)
MCHC: 32.7 g/dL (ref 30.0–36.0)
MCV: 98.5 fL (ref 80.0–100.0)
Monocytes Absolute: 1.2 K/uL — ABNORMAL HIGH (ref 0.1–1.0)
Monocytes Relative: 21 %
Neutro Abs: 3.9 K/uL (ref 1.7–7.7)
Neutrophils Relative %: 67 %
Platelet Count: 80 K/uL — ABNORMAL LOW (ref 150–400)
RBC: 3.29 MIL/uL — ABNORMAL LOW (ref 3.87–5.11)
RDW: 15.1 % (ref 11.5–15.5)
WBC Count: 5.8 K/uL (ref 4.0–10.5)
nRBC: 0 % (ref 0.0–0.2)

## 2024-02-07 MED ORDER — DEXAMETHASONE 4 MG PO TABS
20.0000 mg | ORAL_TABLET | Freq: Once | ORAL | Status: AC
  Administered 2024-02-07: 20 mg via ORAL
  Filled 2024-02-07: qty 5

## 2024-02-07 MED ORDER — ACETAMINOPHEN 325 MG PO TABS
650.0000 mg | ORAL_TABLET | Freq: Once | ORAL | Status: AC
  Administered 2024-02-07: 650 mg via ORAL
  Filled 2024-02-07: qty 2

## 2024-02-07 MED ORDER — DARATUMUMAB-HYALURONIDASE-FIHJ 1800-30000 MG-UT/15ML ~~LOC~~ SOLN
1800.0000 mg | Freq: Once | SUBCUTANEOUS | Status: AC
  Administered 2024-02-07: 1800 mg via SUBCUTANEOUS
  Filled 2024-02-07: qty 15

## 2024-02-07 MED ORDER — LACTULOSE 10 GM/15ML PO SOLN: 10.0000 g | Freq: Two times a day (BID) | ORAL | 1 refills | Status: AC | PRN

## 2024-02-07 MED ORDER — INFLUENZA VAC SPLIT HIGH-DOSE 0.5 ML IM SUSY
0.5000 mL | PREFILLED_SYRINGE | Freq: Once | INTRAMUSCULAR | Status: AC
  Administered 2024-02-07: 0.5 mL via INTRAMUSCULAR
  Filled 2024-02-07: qty 0.5

## 2024-02-07 MED ORDER — DIPHENHYDRAMINE HCL 25 MG PO CAPS
50.0000 mg | ORAL_CAPSULE | Freq: Once | ORAL | Status: AC
  Administered 2024-02-07: 50 mg via ORAL
  Filled 2024-02-07: qty 2

## 2024-02-07 NOTE — Patient Instructions (Signed)
 [facility] - A DEPT OF [facility]  Discharge Instructions: Thank you for choosing [facility] to provide your oncology and hematology care.  If you have a lab appointment with the Cancer Center, please go directly to the Cancer Center and check in at the registration area.  Wear comfortable clothing and clothing appropriate for easy access to any Portacath or PICC line.   We strive to give you quality time with your provider. You may need to reschedule your appointment if you arrive late (15 or more minutes).  Arriving late affects you and other patients whose appointments are after yours.  Also, if you miss three or more appointments without notifying the office, you may be dismissed from the clinic at the provider's discretion.      For prescription refill requests, have your pharmacy contact our office and allow 72 hours for refills to be completed.    Today you received the following chemotherapy and/or immunotherapy agents :   daratumumab     To help prevent nausea and vomiting after your treatment, we encourage you to take your nausea medication as directed.  BELOW ARE SYMPTOMS THAT SHOULD BE REPORTED IMMEDIATELY: *FEVER GREATER THAN 100.4 F (38 C) OR HIGHER *CHILLS OR SWEATING *NAUSEA AND VOMITING THAT IS NOT CONTROLLED WITH YOUR NAUSEA MEDICATION *UNUSUAL SHORTNESS OF BREATH *UNUSUAL BRUISING OR BLEEDING *URINARY PROBLEMS (pain or burning when urinating, or frequent urination) *BOWEL PROBLEMS (unusual diarrhea, constipation, pain near the anus) TENDERNESS IN MOUTH AND THROAT WITH OR WITHOUT PRESENCE OF ULCERS (sore throat, sores in mouth, or a toothache) UNUSUAL RASH, SWELLING OR PAIN  UNUSUAL VAGINAL DISCHARGE OR ITCHING   Items with * indicate a potential emergency and should be followed up as soon as possible or go to the Emergency Department if any problems should occur.  Please show the CHEMOTHERAPY ALERT CARD or  IMMUNOTHERAPY ALERT CARD at check-in to the Emergency Department and triage nurse.  Should you have questions after your visit or need to cancel or reschedule your appointment, please contact [facility] - A DEPT OF [facility]  [phone] and follow the prompts.  Office hours are 8:00 a.m. to 4:30 p.m. Monday - Friday. Please note that voicemails left after 4:00 p.m. may not be returned until the following business day.  We are closed weekends and major holidays. You have access to a nurse at all times for urgent questions. Please call the main number to the clinic [phone] and follow the prompts.  For any non-urgent questions, you may also contact your provider using MyChart. We now offer e-Visits for anyone 25 and older to request care online for non-urgent symptoms. For details visit [email or web address].   Also download the MyChart app! Go to the app store, search MyChart, open the app, select [place], and log in with your MyChart username and password.

## 2024-02-07 NOTE — Assessment & Plan Note (Signed)
 Continues to have symptoms despite on Colace, Senakot, Miralax.  Recommend patient to try Lactulose  10g BID PRN. Rx was sent.

## 2024-02-07 NOTE — Assessment & Plan Note (Signed)
 Secondary to multiple myeloma/ chemotherapy  Monitor closely.

## 2024-02-07 NOTE — Assessment & Plan Note (Addendum)
 Bone marrow biopsy results, PET scan findings, lab results were reviewed and discussed with patient. Diagnosis of IgA kappa multiple myeloma was reviewed and discussed.  Lab Results  Component Value Date   MPROTEIN 1.7 (H) 01/24/2024   KPAFRELGTCHN 160.8 (H) 01/24/2024   LAMBDASER 3.9 (L) 01/24/2024   KAPLAMBRATIO 41.23 (H) 01/24/2024    Normal Cytogenetics and multiple myeloma FISH testing results are pending. Patient is a transplant candidate.   Labs are reviewed and discussed with patient. M protein and light chain levels have responded very well.  Proceed with cycle 2 D15 Dara RVD.   Thrombosis prophylaxis, aspirin 81 mg daily. Shingle prophylaxis: acyclovir  She has ongoing dental issue, not cleared by dentist for further zometa  treatments. She has ongoing dental work

## 2024-02-07 NOTE — Progress Notes (Signed)
 Hematology/Oncology Progress note [phone] [phone]       CHIEF COMPLAINTS/PURPOSE OF CONSULTATION:  Multiple myeloma  ASSESSMENT & PLAN:   Multiple myeloma (HCC) Bone marrow biopsy results, PET scan findings, lab results were reviewed and discussed with patient. Diagnosis of IgA kappa multiple myeloma was reviewed and discussed.  Lab Results  Component Value Date   MPROTEIN 1.7 (H) 01/24/2024   KPAFRELGTCHN 160.8 (H) 01/24/2024   LAMBDASER 3.9 (L) 01/24/2024   KAPLAMBRATIO 41.23 (H) 01/24/2024    Normal Cytogenetics and multiple myeloma FISH testing results are pending. Patient is a transplant candidate.   Labs are reviewed and discussed with patient. M protein and light chain levels have responded very well.  Proceed with cycle 2 D15 Dara RVD.   Thrombosis prophylaxis, aspirin 81 mg daily. Shingle prophylaxis: acyclovir  She has ongoing dental issue, not cleared by dentist for further zometa  treatments. She has ongoing dental work  Neoplasm related pain Continue Norco 5/325mg  Q4-6hour PRN.- she uses rarely.  MS Contin  15 mg twice daily to every 8 hours.   Constipation Continues to have symptoms despite on Colace, Senakot, Miralax.  Recommend patient to try Lactulose  10g BID PRN. Rx was sent.   Macrocytic anemia Secondary to multiple myeloma/ chemotherapy  Monitor closely.  Thrombocytopenia (HCC) Chemotherapy induced. Monitor closely  Leg swelling Obtain US  of BL LE venous to r/o DVT.  Recommend leg elevation and compression stocking.    Orders Placed This Encounter  Procedures   US  Venous Img Lower Bilateral    Standing Status:   Future    Expected Date:   02/14/2024    Expiration Date:   02/06/2025    Reason for Exam (SYMPTOM  OR DIAGNOSIS REQUIRED):   Leg swelling    Preferred imaging location?:   [facility]   Follow-up per LOS All questions were answered. The patient knows to call the clinic with any problems, questions  or concerns.  Zelphia Cap, MD, PhD [facility] Hematology Oncology 02/07/2024    HISTORY OF PRESENTING ILLNESS:  Dana Wallace 68 y.o. female presents for follow up of multiple myeloma   Oncology History  Multiple myeloma (HCC)  12/09/2023 Initial Diagnosis   Multiple myeloma (HCC)  She initially experienced chest discomfort starting in her left shoulder in February, which persisted and led her to seek medical attention in April. Despite a comprehensive workup including a chest x-ray, EKG, echocardiogram, mammogram, and a DEXA scan, all results were normal.  In May, she had two follow-up visits with her primary care physician. The discomfort was primarily located behind her left breast and later moved to her right clavicle and sternum area, becoming very painful and tender to touch. An x-ray at [facility] was performed, and the patient was told there was some type of lesion, after which she underwent an MRI of the right clavicle, shoulder, and scapula. Interval marrow biopsy, lab workup and PET scan. She was found to have stage II IgA kappa multiple myeloma.   12/15/2023 Imaging   PET scan showed  1. Diffuse and extensive lytic bone lesions throughout the axial and appendicular skeleton most likely reflecting multiple myeloma. 2. Pathologic fracture involving the right clavicle. 3. Large soft tissue masses associated with the left second anterior rib and the lower sternum. 4. No findings for a primary neoplastic process involving the neck, chest, abdomen or pelvis   12/18/2023 Bone Marrow Biopsy   Pulmonary biopsy aspirate, clot, core Showed hypercellular bone marrow, involved by kappa restricted plasma  cell neoplasm compromising 30-100%, average 70%, of the cellular marrow.   12/25/2023 Cancer Staging   Staging form: Plasma Cell Myeloma and Plasma Cell Disorders, AJCC 8th Edition - Clinical stage from 12/25/2023: Beta-2 -microglobulin (mg/L): 3.9, Albumin (g/dL): 3.2, ISS: Stage II,  LDH: Normal - Signed by Babara Call, MD on 12/25/2023 Stage prefix: Initial diagnosis Beta 2 microglobulin range (mg/L): 3.5 to 5.49 Albumin range (g/dL): Less than 3.5   1/84/7974 -  Chemotherapy   Patient is on Treatment Plan : MYELOMA NEWLY DIAGNOSED TRANSPLANT CANDIDATE DaraVRd (Daratumumab  SQ) q21d x 6 Cycles (Induction/Consolidation)       Pain of upper chest and back, right shoulder, and the left hip have improved. She rarely takes Norco as needed. She takes MS Contin  15 mg Q8h    Accompanied by husband.  She follows up with her dentist for cavity filling.  She declined cardiology evaluation of irregular hearts beats. Bradycardia has improved.   + constipation - Continues to have symptoms despite on Colace, Senakot, Miralax.   MEDICAL HISTORY:  Past Medical History:  Diagnosis Date   Anxiety    Diabetes mellitus without complication (HCC)    Hypertension     SURGICAL HISTORY: Past Surgical History:  Procedure Laterality Date   CESAREAN SECTION     CHOLECYSTECTOMY     IR BONE MARROW BIOPSY & ASPIRATION  12/18/2023   TUBAL LIGATION      SOCIAL HISTORY: Social History   Socioeconomic History   Marital status: Married    Spouse name: Not on file   Number of children: Not on file   Years of education: Not on file   Highest education level: Bachelor's degree (e.g., BA, AB, BS)  Occupational History   Not on file  Tobacco Use   Smoking status: Never   Smokeless tobacco: Never  Substance and Sexual Activity   Alcohol use: No   Drug use: Never   Sexual activity: Not Currently    Birth control/protection: None  Other Topics Concern   Not on file  Social History Narrative   Not on file   Social Drivers of Health   Financial Resource Strain: Low Risk  (01/19/2024)   Overall Financial Resource Strain (CARDIA)    Difficulty of Paying Living Expenses: Not hard at all  Food Insecurity: No Food Insecurity (01/19/2024)   Hunger Vital Sign    Worried About Running Out of  Food in the Last Year: Never true    Ran Out of Food in the Last Year: Never true  Transportation Needs: No Transportation Needs (01/19/2024)   PRAPARE - Administrator, Civil Service (Medical): No    Lack of Transportation (Non-Medical): No  Physical Activity: Inactive (01/19/2024)   Exercise Vital Sign    Days of Exercise per Week: 0 days    Minutes of Exercise per Session: Not on file  Stress: Stress Concern Present (01/19/2024)   [employer] of Occupational Health - Occupational Stress Questionnaire    Feeling of Stress: To some extent  Social Connections: Moderately Isolated (01/19/2024)   Social Connection and Isolation Panel    Frequency of Communication with Friends and Family: More than three times a week    Frequency of Social Gatherings with Friends and Family: Once a week    Attends Religious Services: Patient declined    Database administrator or Organizations: No    Attends Engineer, structural: Not on file    Marital Status: Married  Catering manager Violence: Not  At Risk (12/09/2023)   Humiliation, Afraid, Rape, and Kick questionnaire    Fear of Current or Ex-Partner: No    Emotionally Abused: No    Physically Abused: No    Sexually Abused: No    FAMILY HISTORY: Family History  Problem Relation Age of Onset   COPD Mother    Multiple sclerosis Mother    Varicose Veins Mother    Hodgkin's lymphoma Sister    Cancer Sister    Breast cancer Neg Hx     ALLERGIES:  has no known allergies.  MEDICATIONS:  Current Outpatient Medications  Medication Sig Dispense Refill   acyclovir  (ZOVIRAX ) 400 MG tablet Take 1 tablet (400 mg total) by mouth 2 (two) times daily. 60 tablet 3   albuterol  (VENTOLIN  HFA) 108 (90 Base) MCG/ACT inhaler Inhale 2 puffs into the lungs every 6 (six) hours as needed for wheezing or shortness of breath. 8 g 2   ALPRAZolam  (XANAX ) 0.5 MG tablet Take 0.5-1 tablets (0.25-0.5 mg total) by mouth 2 (two) times daily as needed  for anxiety. 30 tablet 0   aspirin 81 MG tablet Take by mouth.     Blood Glucose Monitoring Suppl (ONE TOUCH ULTRA 2) w/Device KIT 1 each by Does not apply route daily at 2 PM. 1 kit 0   glipiZIDE  (GLUCOTROL  XL) 5 MG 24 hr tablet TAKE 1 TABLET(5 MG) BY MOUTH DAILY WITH BREAKFAST 90 tablet 1   glucose blood (ONETOUCH ULTRA) test strip 1 each by Other route daily. Use as instructed 100 each 12   hydrochlorothiazide  (HYDRODIURIL ) 12.5 MG tablet Take 1 tablet (12.5 mg total) by mouth daily. 90 tablet 3   HYDROcodone -acetaminophen  (NORCO/VICODIN) 5-325 MG tablet Take 1-2 tablets by mouth every 4 (four) hours as needed for moderate pain (pain score 4-6). 60 tablet 0   lactulose  (CHRONULAC ) 10 GM/15ML solution Take 15 mLs (10 g total) by mouth 2 (two) times daily as needed for mild constipation. 236 mL 1   latanoprost (XALATAN) 0.005 % ophthalmic solution 1 drop at bedtime.     lenalidomide  (REVLIMID ) 25 MG capsule Take 1 capsule (25 mg total) by mouth daily. Take for 14 days, then hold for 7 days. Repeat every 21 days. 14 capsule 0   metFORMIN  (GLUCOPHAGE ) 1000 MG tablet Take 1 tablet (1,000 mg total) by mouth 2 (two) times daily with a meal. 180 tablet 3   montelukast  (SINGULAIR ) 10 MG tablet Take 1 tablet (10 mg total) by mouth daily as needed (wheezing). 10 tablet 0   morphine  (MS CONTIN ) 15 MG 12 hr tablet Take 1 tablet (15 mg total) by mouth every 8 (eight) hours. 90 tablet 0   ondansetron  (ZOFRAN ) 8 MG tablet Take 1 tablet (8 mg total) by mouth every 8 (eight) hours as needed for nausea or vomiting. 30 tablet 1   simvastatin  (ZOCOR ) 10 MG tablet TAKE 1 TABLET(10 MG) BY MOUTH DAILY AT 6 PM 90 tablet 2   naloxone  (NARCAN ) nasal spray 4 mg/0.1 mL SPRAY 1 SPRAY INTO ONE NOSTRIL AS DIRECTED FOR OPIOID OVERDOSE (TURN PERSON ON SIDE AFTER DOSE. IF NO RESPONSE IN 2-3 MINUTES OR PERSON RESPONDS BUT RELAPSES, REPEAT USING A NEW SPRAY DEVICE AND SPRAY INTO THE OTHER NOSTRIL. CALL 911 AFTER USE.) * EMERGENCY USE  ONLY * (Patient not taking: Reported on 02/07/2024) 1 each 0   No current facility-administered medications for this visit.   Facility-Administered Medications Ordered in Other Visits  Medication Dose Route Frequency Provider Last Rate Last Admin  daratumumab -hyaluronidase -fihj (DARZALEX  FASPRO) 1800-30000 MG-UT/15ML chemo SQ injection 1,800 mg  1,800 mg Subcutaneous Once Babara Call, MD       NOREEN ON 02/08/2024] Influenza vac split trivalent PF (FLUZONE HIGH-DOSE) injection 0.5 mL  0.5 mL Intramuscular Tomorrow-1000 Babara Call, MD        Review of Systems  Constitutional:  Positive for fatigue. Negative for appetite change, chills and fever.  HENT:   Negative for hearing loss and voice change.   Eyes:  Negative for eye problems.  Respiratory:  Negative for chest tightness and cough.   Cardiovascular:  Negative for chest pain.  Gastrointestinal:  Negative for abdominal distention, abdominal pain and blood in stool.  Endocrine: Negative for hot flashes.  Genitourinary:  Negative for difficulty urinating and frequency.   Musculoskeletal:  Positive for arthralgias.       Shoulder, upper chest, hip pain improved.   Skin:  Negative for itching and rash.  Neurological:  Negative for extremity weakness.  Hematological:  Negative for adenopathy.  Psychiatric/Behavioral:  Negative for confusion.      PHYSICAL EXAMINATION: ECOG PERFORMANCE STATUS: 1 - Symptomatic but completely ambulatory  Vitals:   02/07/24 0843 02/07/24 0852  BP: (!) 151/58 (!) 136/59  Pulse: 89   Resp: 18   Temp: 98.2 F (36.8 C)   SpO2: 98%    Filed Weights   02/07/24 0843  Weight: 174 lb 4.8 oz (79.1 kg)    Physical Exam Constitutional:      General: She is not in acute distress.    Appearance: She is not diaphoretic.  HENT:     Head: Normocephalic and atraumatic.  Eyes:     General: No scleral icterus. Cardiovascular:     Rate and Rhythm: Normal rate. Rhythm irregular.     Heart sounds: No murmur  heard. Pulmonary:     Effort: Pulmonary effort is normal. No respiratory distress.     Breath sounds: Normal breath sounds. No wheezing.  Abdominal:     General: Bowel sounds are normal. There is no distension.     Palpations: Abdomen is soft.     Tenderness: There is no abdominal tenderness.  Musculoskeletal:        General: Normal range of motion.     Cervical back: Normal range of motion and neck supple.  Skin:    General: Skin is warm and dry.     Findings: No erythema.  Neurological:     Mental Status: She is alert and oriented to person, place, and time. Mental status is at baseline.     Cranial Nerves: No cranial nerve deficit.     Motor: No abnormal muscle tone.     Coordination: Coordination normal.  Psychiatric:        Mood and Affect: Mood and affect normal.      LABORATORY DATA:  I have reviewed the data as listed    Latest Ref Rng & Units 02/07/2024    8:31 AM 01/31/2024    8:50 AM 01/24/2024    8:34 AM  CBC  WBC 4.0 - 10.5 K/uL 5.8  4.1  4.9   Hemoglobin 12.0 - 15.0 g/dL 89.3  9.9  9.1   Hematocrit 36.0 - 46.0 % 32.4  30.7  28.5   Platelets 150 - 400 K/uL 80  118  264       Latest Ref Rng & Units 01/31/2024    8:50 AM 01/24/2024    8:34 AM 01/17/2024    8:44 AM  CMP  Glucose 70 - 99 mg/dL 884  88  877   BUN 8 - 23 mg/dL 8  10  19    Creatinine 0.44 - 1.00 mg/dL 9.45  9.47  9.26   Sodium 135 - 145 mmol/L 136  138  133   Potassium 3.5 - 5.1 mmol/L 4.1  4.5  4.9   Chloride 98 - 111 mmol/L 101  104  97   CO2 22 - 32 mmol/L 28  25  26    Calcium  8.9 - 10.3 mg/dL 8.7  8.7  8.7   Total Protein 6.5 - 8.1 g/dL 6.8  7.1  8.1   Total Bilirubin 0.0 - 1.2 mg/dL 0.7  0.4  0.5   Alkaline Phos 38 - 126 U/L 142  138  79   AST 15 - 41 U/L 18  19  35   ALT 0 - 44 U/L 14  17  14       RADIOGRAPHIC STUDIES: I have personally reviewed the radiological images as listed and agreed with the findings in the report. DG Chest 2 View Result Date: 01/10/2024 CLINICAL DATA:   Bradycardia. EXAM: CHEST - 2 VIEW COMPARISON:  PET scan of December 13, 2023. Radiograph of August 30, 2023. FINDINGS: Stable cardiomediastinal silhouette. Pleural based mass is noted in left upper lobe laterally consistent with multiple myeloma as noted on prior PET scan. No acute consolidative process is noted. IMPRESSION: Pleural based mass is noted in left upper lobe laterally consistent with multiple myeloma as noted on prior PET scan. Electronically Signed   By: Lynwood Landy Raddle M.D.   On: 01/10/2024 14:29

## 2024-02-07 NOTE — Assessment & Plan Note (Signed)
 Obtain US  of BL LE venous to r/o DVT.  Recommend leg elevation and compression stocking.

## 2024-02-07 NOTE — Assessment & Plan Note (Signed)
 Continue Norco 5/325mg  Q4-6hour PRN.- she uses rarely.  MS Contin  15 mg twice daily to every 8 hours.

## 2024-02-07 NOTE — Assessment & Plan Note (Signed)
 Chemotherapy induced. Monitor closely.

## 2024-02-10 ENCOUNTER — Ambulatory Visit
Admission: RE | Admit: 2024-02-10 | Discharge: 2024-02-10 | Disposition: A | Discharge status: Home / Self Care | Source: Ambulatory Visit | Attending: Oncology | Admitting: Oncology

## 2024-02-10 DIAGNOSIS — M7989 Other specified soft tissue disorders: Secondary | ICD-10-CM | POA: Insufficient documentation

## 2024-02-10 DIAGNOSIS — R600 Localized edema: Secondary | ICD-10-CM | POA: Diagnosis not present

## 2024-02-14 ENCOUNTER — Inpatient Hospital Stay

## 2024-02-14 ENCOUNTER — Inpatient Hospital Stay: Admitting: Oncology

## 2024-02-14 ENCOUNTER — Encounter: Payer: Self-pay | Admitting: Oncology

## 2024-02-14 VITALS — BP 113/66 | HR 81 | Temp 98.0°F | Resp 18 | Wt 170.3 lb

## 2024-02-14 DIAGNOSIS — C9000 Multiple myeloma not having achieved remission: Secondary | ICD-10-CM

## 2024-02-14 DIAGNOSIS — D539 Nutritional anemia, unspecified: Secondary | ICD-10-CM | POA: Diagnosis not present

## 2024-02-14 DIAGNOSIS — Z5111 Encounter for antineoplastic chemotherapy: Secondary | ICD-10-CM | POA: Diagnosis not present

## 2024-02-14 DIAGNOSIS — G893 Neoplasm related pain (acute) (chronic): Secondary | ICD-10-CM | POA: Diagnosis not present

## 2024-02-14 DIAGNOSIS — Z5112 Encounter for antineoplastic immunotherapy: Secondary | ICD-10-CM | POA: Diagnosis not present

## 2024-02-14 DIAGNOSIS — Z23 Encounter for immunization: Secondary | ICD-10-CM | POA: Diagnosis not present

## 2024-02-14 DIAGNOSIS — Z807 Family history of other malignant neoplasms of lymphoid, hematopoietic and related tissues: Secondary | ICD-10-CM | POA: Diagnosis not present

## 2024-02-14 LAB — CMP (CANCER CENTER ONLY)
ALT: 14 U/L (ref 0–44)
AST: 20 U/L (ref 15–41)
Albumin: 3.6 g/dL (ref 3.5–5.0)
Alkaline Phosphatase: 124 U/L (ref 38–126)
Anion gap: 9 (ref 5–15)
BUN: 8 mg/dL (ref 8–23)
CO2: 29 mmol/L (ref 22–32)
Calcium: 8.7 mg/dL — ABNORMAL LOW (ref 8.9–10.3)
Chloride: 99 mmol/L (ref 98–111)
Creatinine: 0.49 mg/dL (ref 0.44–1.00)
GFR, Estimated: >60 mL/min (ref >60)
Glucose, Bld: 147 mg/dL — ABNORMAL HIGH (ref 70–99)
Potassium: 3.6 mmol/L (ref 3.5–5.1)
Sodium: 137 mmol/L (ref 135–145)
Total Bilirubin: 1.1 mg/dL (ref 0.0–1.2)
Total Protein: 6.3 g/dL — ABNORMAL LOW (ref 6.5–8.1)

## 2024-02-14 LAB — CBC WITH DIFFERENTIAL (CANCER CENTER ONLY)
Abs Immature Granulocytes: 0.01 K/uL (ref 0.00–0.07)
Basophils Absolute: 0.1 K/uL (ref 0.0–0.1)
Basophils Relative: 2 %
Eosinophils Absolute: 0.1 K/uL (ref 0.0–0.5)
Eosinophils Relative: 3 %
HCT: 33.1 % — ABNORMAL LOW (ref 36.0–46.0)
Hemoglobin: 10.9 g/dL — ABNORMAL LOW (ref 12.0–15.0)
Immature Granulocytes: 0 %
Lymphocytes Relative: 8 %
Lymphs Abs: 0.4 K/uL — ABNORMAL LOW (ref 0.7–4.0)
MCH: 32.3 pg (ref 26.0–34.0)
MCHC: 32.9 g/dL (ref 30.0–36.0)
MCV: 98.2 fL (ref 80.0–100.0)
Monocytes Absolute: 0.7 K/uL (ref 0.1–1.0)
Monocytes Relative: 17 %
Neutro Abs: 3 K/uL (ref 1.7–7.7)
Neutrophils Relative %: 70 %
Platelet Count: 233 K/uL (ref 150–400)
RBC: 3.37 MIL/uL — ABNORMAL LOW (ref 3.87–5.11)
RDW: 15.3 % (ref 11.5–15.5)
WBC Count: 4.3 K/uL (ref 4.0–10.5)
nRBC: 0 % (ref 0.0–0.2)

## 2024-02-14 MED ORDER — DEXAMETHASONE 4 MG PO TABS
20.0000 mg | ORAL_TABLET | Freq: Once | ORAL | Status: AC
  Administered 2024-02-14: 20 mg via ORAL
  Filled 2024-02-14: qty 5

## 2024-02-14 MED ORDER — GABAPENTIN 100 MG PO CAPS: 100.0000 mg | ORAL_CAPSULE | Freq: Two times a day (BID) | ORAL | 0 refills | Status: DC

## 2024-02-14 MED ORDER — DARATUMUMAB-HYALURONIDASE-FIHJ 1800-30000 MG-UT/15ML ~~LOC~~ SOLN
1800.0000 mg | Freq: Once | SUBCUTANEOUS | Status: AC
  Administered 2024-02-14: 1800 mg via SUBCUTANEOUS
  Filled 2024-02-14: qty 15

## 2024-02-14 MED ORDER — ACETAMINOPHEN 325 MG PO TABS
650.0000 mg | ORAL_TABLET | Freq: Once | ORAL | Status: AC
  Administered 2024-02-14: 650 mg via ORAL
  Filled 2024-02-14: qty 2

## 2024-02-14 MED ORDER — DIPHENHYDRAMINE HCL 25 MG PO CAPS
50.0000 mg | ORAL_CAPSULE | Freq: Once | ORAL | Status: AC
  Administered 2024-02-14: 50 mg via ORAL
  Filled 2024-02-14: qty 2

## 2024-02-14 MED ORDER — BORTEZOMIB CHEMO SQ INJECTION 3.5 MG (2.5MG/ML)
1.3000 mg/m2 | Freq: Once | INTRAMUSCULAR | Status: AC
  Administered 2024-02-14: 2.5 mg via SUBCUTANEOUS
  Filled 2024-02-14: qty 1

## 2024-02-14 NOTE — Assessment & Plan Note (Signed)
 Secondary to multiple myeloma/ chemotherapy  Monitor closely.

## 2024-02-14 NOTE — Assessment & Plan Note (Signed)
 Continue Norco 5/325mg  Q4-6hour PRN.- she uses rarely.  MS Contin  15 mg twice daily to every 8 hours.

## 2024-02-14 NOTE — Patient Instructions (Signed)
 [facility] - A DEPT OF [facility]  Discharge Instructions: Thank you for choosing [facility] to provide your oncology and hematology care.  If you have a lab appointment with the Cancer Center, please go directly to the Cancer Center and check in at the registration area.  Wear comfortable clothing and clothing appropriate for easy access to any Portacath or PICC line.   We strive to give you quality time with your provider. You may need to reschedule your appointment if you arrive late (15 or more minutes).  Arriving late affects you and other patients whose appointments are after yours.  Also, if you miss three or more appointments without notifying the office, you may be dismissed from the clinic at the provider's discretion.      For prescription refill requests, have your pharmacy contact our office and allow 72 hours for refills to be completed.    Today you received the following chemotherapy and/or immunotherapy agents DARZALEX and VELCADE      To help prevent nausea and vomiting after your treatment, we encourage you to take your nausea medication as directed.  BELOW ARE SYMPTOMS THAT SHOULD BE REPORTED IMMEDIATELY: *FEVER GREATER THAN 100.4 F (38 C) OR HIGHER *CHILLS OR SWEATING *NAUSEA AND VOMITING THAT IS NOT CONTROLLED WITH YOUR NAUSEA MEDICATION *UNUSUAL SHORTNESS OF BREATH *UNUSUAL BRUISING OR BLEEDING *URINARY PROBLEMS (pain or burning when urinating, or frequent urination) *BOWEL PROBLEMS (unusual diarrhea, constipation, pain near the anus) TENDERNESS IN MOUTH AND THROAT WITH OR WITHOUT PRESENCE OF ULCERS (sore throat, sores in mouth, or a toothache) UNUSUAL RASH, SWELLING OR PAIN  UNUSUAL VAGINAL DISCHARGE OR ITCHING   Items with * indicate a potential emergency and should be followed up as soon as possible or go to the Emergency Department if any problems should occur.  Please show the CHEMOTHERAPY ALERT CARD or  IMMUNOTHERAPY ALERT CARD at check-in to the Emergency Department and triage nurse.  Should you have questions after your visit or need to cancel or reschedule your appointment, please contact [facility] - A DEPT OF [facility]  [phone] and follow the prompts.  Office hours are 8:00 a.m. to 4:30 p.m. Monday - Friday. Please note that voicemails left after 4:00 p.m. may not be returned until the following business day.  We are closed weekends and major holidays. You have access to a nurse at all times for urgent questions. Please call the main number to the clinic [phone] and follow the prompts.  For any non-urgent questions, you may also contact your provider using MyChart. We now offer e-Visits for anyone 30 and older to request care online for non-urgent symptoms. For details visit [email or web address].   Also download the MyChart app! Go to the app store, search "MyChart", open the app, select [place], and log in with your MyChart username and password.  Daratumumab Injection What is this medication? DARATUMUMAB (dar a toom ue mab) treats multiple myeloma, a type of bone marrow cancer. It works by helping your immune system slow or stop the spread of cancer cells. It is a monoclonal antibody. This medicine may be used for other purposes; ask your health care provider or pharmacist if you have questions. COMMON BRAND NAME(S): DARZALEX What should I tell my care team before I take this medication? They need to know if you have any of these conditions: Hereditary fructose intolerance Infection, such as chickenpox, herpes, hepatitis  B Lung or breathing disease, such as asthma, COPD An unusual or allergic reaction to daratumumab, sorbitol, other medications, foods, dyes, or preservatives Pregnant or trying to get pregnant Breastfeeding How should I use this medication? This medication is injected into a vein. It is given by your care team in a  hospital or clinic setting. Talk to your care team about the use of this medication in children. Special care may be needed. Overdosage: If you think you have taken too much of this medicine contact a poison control center or emergency room at once. NOTE: This medicine is only for you. Do not share this medicine with others. What if I miss a dose? Keep appointments for follow-up doses. It is important not to miss your dose. Call your care team if you are unable to keep an appointment. What may interact with this medication? Interactions have not been studied. This list may not describe all possible interactions. Give your health care provider a list of all the medicines, herbs, non-prescription drugs, or dietary supplements you use. Also tell them if you smoke, drink alcohol, or use illegal drugs. Some items may interact with your medicine. What should I watch for while using this medication? Your condition will be monitored carefully while you are receiving this medication. This medication can cause serious allergic reactions. To reduce your risk, your care team may give you other medication to take before receiving this one. Be sure to follow the directions from your care team. This medication can affect the results of blood tests to match your blood type. These changes can last for up to 6 months after the final dose. Your care team will do blood tests to match your blood type before you start treatment. Tell all of your care team that you are being treated with this medication before receiving a blood transfusion. This medication can affect the results of some tests used to determine treatment response; extra tests may be needed to evaluate response. Talk to your care team if you wish to become pregnant or think you are pregnant. This medication can cause serious birth defects if taken during pregnancy and for 3 months after the last dose. A reliable form of contraception is recommended while  taking this medication and for 3 months after the last dose. Talk to your care team about effective forms of contraception. Do not breast-feed while taking this medication. What side effects may I notice from receiving this medication? Side effects that you should report to your care team as soon as possible: Allergic reactions--skin rash, itching, hives, swelling of the face, lips, tongue, or throat Infection--fever, chills, cough, sore throat, wounds that don't heal, pain or trouble when passing urine, general feeling of discomfort or being unwell Infusion reactions--chest pain, shortness of breath or trouble breathing, feeling faint or lightheaded Unusual bruising or bleeding Side effects that usually do not require medical attention (report to your care team if they continue or are bothersome): Constipation Diarrhea Fatigue Nausea Pain, tingling, or numbness in the hands or feet Swelling of the ankles, hands, or feet This list may not describe all possible side effects. Call your doctor for medical advice about side effects. You may report side effects to FDA at 1-800-FDA-1088. Where should I keep my medication? This medication is given in a hospital or clinic. It will not be stored at home. NOTE: This sheet is a summary. It may not cover all possible information. If you have questions about this medicine, talk to your doctor,  pharmacist, or health care provider.  2024 Elsevier/Gold Standard (2022-03-15 00:00:00)  Bortezomib Injection What is this medication? BORTEZOMIB (bor TEZ oh mib) treats lymphoma. It may also be used to treat multiple myeloma, a type of bone marrow cancer. It works by blocking a protein that causes cancer cells to grow and multiply. This helps to slow or stop the spread of cancer cells. This medicine may be used for other purposes; ask your health care provider or pharmacist if you have questions. COMMON BRAND NAME(S): BORUZU, Velcade What should I tell my care  team before I take this medication? They need to know if you have any of these conditions: Dehydration Diabetes Heart disease Liver disease Tingling of the fingers or toes or other nerve disorder An unusual or allergic reaction to bortezomib, other medications, foods, dyes, or preservatives If you or your partner are pregnant or trying to get pregnant Breastfeeding How should I use this medication? This medication is injected into a vein or under the skin. It is given by your care team in a hospital or clinic setting. Talk to your care team about the use of this medication in children. Special care may be needed. Overdosage: If you think you have taken too much of this medicine contact a poison control center or emergency room at once. NOTE: This medicine is only for you. Do not share this medicine with others. What if I miss a dose? Keep appointments for follow-up doses. It is important not to miss your dose. Call your care team if you are unable to keep an appointment. What may interact with this medication? Ketoconazole Rifampin This list may not describe all possible interactions. Give your health care provider a list of all the medicines, herbs, non-prescription drugs, or dietary supplements you use. Also tell them if you smoke, drink alcohol, or use illegal drugs. Some items may interact with your medicine. What should I watch for while using this medication? Your condition will be monitored carefully while you are receiving this medication. You may need blood work while taking this medication. This medication may affect your coordination, reaction time, or judgment. Do not drive or operate machinery until you know how this medication affects you. Sit up or stand slowly to reduce the risk of dizzy or fainting spells. Drinking alcohol with this medication can increase the risk of these side effects. This medication may increase your risk of getting an infection. Call your care team for  advice if you get a fever, chills, sore throat, or other symptoms of a cold or flu. Do not treat yourself. Try to avoid being around people who are sick. Check with your care team if you have severe diarrhea, nausea, and vomiting, or if you sweat a lot. The loss of too much body fluid may make it dangerous for you to take this medication. Talk to your care team if you may be pregnant. Serious birth defects can occur if you take this medication during pregnancy and for 7 months after the last dose. You will need a negative pregnancy test before starting this medication. Contraception is recommended while taking this medication and for 7 months after the last dose. Your care team can help you find the option that works for you. If your partner can get pregnant, use a condom during sex while taking this medication and for 4 months after the last dose. Do not breastfeed while taking this medication and for 2 months after the last dose. This medication may cause infertility. Talk  to your care team if you are concerned about your fertility. What side effects may I notice from receiving this medication? Side effects that you should report to your care team as soon as possible: Allergic reactions--skin rash, itching, hives, swelling of the face, lips, tongue, or throat Bleeding--bloody or black, tar-like stools, vomiting blood or brown material that looks like coffee grounds, red or dark brown urine, small red or purple spots on skin, unusual bruising or bleeding Bleeding in the brain--severe headache, stiff neck, confusion, dizziness, change in vision, numbness or weakness of the face, arm, or leg, trouble speaking, trouble walking, vomiting Bowel blockage--stomach cramping, unable to have a bowel movement or pass gas, loss of appetite, vomiting Heart failure--shortness of breath, swelling of the ankles, feet, or hands, sudden weight gain, unusual weakness or fatigue Infection--fever, chills, cough, sore  throat, wounds that don't heal, pain or trouble when passing urine, general feeling of discomfort or being unwell Liver injury--right upper belly pain, loss of appetite, nausea, light-colored stool, dark yellow or brown urine, yellowing skin or eyes, unusual weakness or fatigue Low blood pressure--dizziness, feeling faint or lightheaded, blurry vision Lung injury--shortness of breath or trouble breathing, cough, spitting up blood, chest pain, fever Pain, tingling, or numbness in the hands or feet Severe or prolonged diarrhea Stomach pain, bloody diarrhea, pale skin, unusual weakness or fatigue, decrease in the amount of urine, which may be signs of hemolytic uremic syndrome Sudden and severe headache, confusion, change in vision, seizures, which may be signs of posterior reversible encephalopathy syndrome (PRES) TTP--purple spots on the skin or inside the mouth, pale skin, yellowing skin or eyes, unusual weakness or fatigue, fever, fast or irregular heartbeat, confusion, change in vision, trouble speaking, trouble walking Tumor lysis syndrome (TLS)--nausea, vomiting, diarrhea, decrease in the amount of urine, dark urine, unusual weakness or fatigue, confusion, muscle pain or cramps, fast or irregular heartbeat, joint pain Side effects that usually do not require medical attention (report to your care team if they continue or are bothersome): Constipation Diarrhea Fatigue Loss of appetite Nausea This list may not describe all possible side effects. Call your doctor for medical advice about side effects. You may report side effects to FDA at 1-800-FDA-1088. Where should I keep my medication? This medication is given in a hospital or clinic. It will not be stored at home. NOTE: This sheet is a summary. It may not cover all possible information. If you have questions about this medicine, talk to your doctor, pharmacist, or health care provider.  2024 Elsevier/Gold Standard (2021-10-10  00:00:00)

## 2024-02-14 NOTE — Progress Notes (Signed)
 Hematology/Oncology Progress note Telephone:([phone]) [phone] Fax:(336) [phone]       CHIEF COMPLAINTS/PURPOSE OF CONSULTATION:  Multiple myeloma  ASSESSMENT & PLAN:   Multiple myeloma (HCC) Bone marrow biopsy results, PET scan findings, lab results were reviewed and discussed with patient. Diagnosis of IgA kappa multiple myeloma was reviewed and discussed.  Lab Results  Component Value Date   MPROTEIN 1.7 (H) 01/24/2024   KPAFRELGTCHN 160.8 (H) 01/24/2024   LAMBDASER 3.9 (L) 01/24/2024   KAPLAMBRATIO 41.23 (H) 01/24/2024    Normal Cytogenetics and multiple myeloma FISH testing results are pending. Patient is a transplant candidate.   Labs are reviewed and discussed with patient. M protein and light chain levels have responded very well.  Proceed with cycle 3  Dara RVD.  Refer patient to [facility] oncology to establish care for evaluation of bone marrow transplant.  Thrombosis prophylaxis, aspirin 81 mg daily. Shingle prophylaxis: acyclovir  She has ongoing dental issue, not cleared by dentist for further zometa  treatments. 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 S/p Zometa  4 mg x 1 -emergent use. Improved.   Macrocytic anemia Secondary to multiple myeloma/ chemotherapy  Monitor closely.  Neoplasm related pain Continue Norco 5/325mg  Q4-6hour PRN.- she uses rarely.  MS Contin  15 mg twice daily to every 8 hours.    Orders Placed This Encounter  Procedures   Multiple Myeloma Panel (SPEP&IFE w/QIG)    Standing Status:   Future    Expected Date:   03/06/2024    Expiration Date:   03/06/2025   Kappa/lambda light chains    Standing Status:   Future    Expected Date:   03/06/2024    Expiration Date:   03/06/2025   Kappa/lambda light chains    Standing Status:   Future    Expected Date:   03/06/2024    Expiration Date:   03/06/2025   Multiple Myeloma Panel (SPEP&IFE w/QIG)    Standing Status:   Future    Expected Date:   03/06/2024     Expiration Date:   03/06/2025   CBC with Differential (Cancer Center Only)    Standing Status:   Future    Expected Date:   03/06/2024    Expiration Date:   03/06/2025   CMP (Cancer Center only)    Standing Status:   Future    Expected Date:   03/06/2024    Expiration Date:   03/06/2025   CBC with Differential (Cancer Center Only)    Standing Status:   Future    Expected Date:   03/13/2024    Expiration Date:   03/13/2025   CMP (Cancer Center only)    Standing Status:   Future    Expected Date:   03/13/2024    Expiration Date:   03/13/2025   CBC with Differential (Cancer Center Only)    Standing Status:   Future    Expected Date:   03/20/2024    Expiration Date:   03/20/2025   Ambulatory referral to Hematology / Oncology    Referral Priority:   Routine    Referral Type:   Consultation    Referral Reason:   Specialty Services Required    Requested Specialty:   Oncology    Number of Visits Requested:   1   Follow-up per LOS All questions were answered. The patient knows to call the clinic with any problems, questions or concerns.  Zelphia Cap, MD, PhD [facility] Hematology Oncology 02/14/2024    HISTORY OF PRESENTING ILLNESS:  Dana Wallace 68 y.o. female presents for follow up of multiple myeloma   Oncology History  Multiple myeloma (HCC)  12/09/2023 Initial Diagnosis   Multiple myeloma (HCC)  She initially experienced chest discomfort starting in her left shoulder in February, which persisted and led her to seek medical attention in April. Despite a comprehensive workup including a chest x-ray, EKG, echocardiogram, mammogram, and a DEXA scan, all results were normal.  In May, she had two follow-up visits with her primary care physician. The discomfort was primarily located behind her left breast and later moved to her right clavicle and sternum area, becoming very painful and tender to touch. An x-ray at [facility] was performed, and the patient was told there was some  type of lesion, after which she underwent an MRI of the right clavicle, shoulder, and scapula. Interval marrow biopsy, lab workup and PET scan. She was found to have stage II IgA kappa multiple myeloma.   12/15/2023 Imaging   PET scan showed  1. Diffuse and extensive lytic bone lesions throughout the axial and appendicular skeleton most likely reflecting multiple myeloma. 2. Pathologic fracture involving the right clavicle. 3. Large soft tissue masses associated with the left second anterior rib and the lower sternum. 4. No findings for a primary neoplastic process involving the neck, chest, abdomen or pelvis   12/18/2023 Bone Marrow Biopsy   Pulmonary biopsy aspirate, clot, core Showed hypercellular bone marrow, involved by kappa restricted plasma cell neoplasm compromising 30-100%, average 70%, of the cellular marrow.   12/25/2023 Cancer Staging   Staging form: Plasma Cell Myeloma and Plasma Cell Disorders, AJCC 8th Edition - Clinical stage from 12/25/2023: Beta-2 -microglobulin (mg/L): 3.9, Albumin (g/dL): 3.2, ISS: Stage II, LDH: Normal - Signed by Babara Call, MD on 12/25/2023 Stage prefix: Initial diagnosis Beta 2 microglobulin range (mg/L): 3.5 to 5.49 Albumin range (g/dL): Less than 3.5   1/84/7974 -  Chemotherapy   Patient is on Treatment Plan : MYELOMA NEWLY DIAGNOSED TRANSPLANT CANDIDATE DaraVRd (Daratumumab  SQ) q21d x 6 Cycles (Induction/Consolidation)       Pain of upper chest and back, right shoulder, and the left hip have improved. She rarely takes Norco as needed. She takes MS Contin  15 mg Q8h    Accompanied by husband.  She follows up with her dentist for cavity filling.  She reports feeling well.  She has no new complaints.  MEDICAL HISTORY:  Past Medical History:  Diagnosis Date   Anxiety    Diabetes mellitus without complication (HCC)    Hypertension     SURGICAL HISTORY: Past Surgical History:  Procedure Laterality Date   CESAREAN SECTION     CHOLECYSTECTOMY      IR BONE MARROW BIOPSY & ASPIRATION  12/18/2023   TUBAL LIGATION      SOCIAL HISTORY: Social History   Socioeconomic History   Marital status: Married    Spouse name: Not on file   Number of children: Not on file   Years of education: Not on file   Highest education level: Bachelor's degree (e.g., BA, AB, BS)  Occupational History   Not on file  Tobacco Use   Smoking status: Never   Smokeless tobacco: Never  Substance and Sexual Activity   Alcohol use: No   Drug use: Never   Sexual activity: Not Currently    Birth control/protection: None  Other Topics Concern   Not on file  Social History Narrative   Not on file   Social Drivers of Health  Financial Resource Strain: Low Risk  (01/19/2024)   Overall Financial Resource Strain (CARDIA)    Difficulty of Paying Living Expenses: Not hard at all  Food Insecurity: No Food Insecurity (01/19/2024)   Hunger Vital Sign    Worried About Running Out of Food in the Last Year: Never true    Ran Out of Food in the Last Year: Never true  Transportation Needs: No Transportation Needs (01/19/2024)   PRAPARE - Administrator, Civil Service (Medical): No    Lack of Transportation (Non-Medical): No  Physical Activity: Inactive (01/19/2024)   Exercise Vital Sign    Days of Exercise per Week: 0 days    Minutes of Exercise per Session: Not on file  Stress: Stress Concern Present (01/19/2024)   [employer] of Occupational Health - Occupational Stress Questionnaire    Feeling of Stress: To some extent  Social Connections: Moderately Isolated (01/19/2024)   Social Connection and Isolation Panel    Frequency of Communication with Friends and Family: More than three times a week    Frequency of Social Gatherings with Friends and Family: Once a week    Attends Religious Services: Patient declined    Database administrator or Organizations: No    Attends Engineer, structural: Not on file    Marital Status: Married   Catering manager Violence: Not At Risk (12/09/2023)   Humiliation, Afraid, Rape, and Kick questionnaire    Fear of Current or Ex-Partner: No    Emotionally Abused: No    Physically Abused: No    Sexually Abused: No    FAMILY HISTORY: Family History  Problem Relation Age of Onset   COPD Mother    Multiple sclerosis Mother    Varicose Veins Mother    Hodgkin's lymphoma Sister    Cancer Sister    Breast cancer Neg Hx     ALLERGIES:  has no known allergies.  MEDICATIONS:  Current Outpatient Medications  Medication Sig Dispense Refill   acyclovir  (ZOVIRAX ) 400 MG tablet Take 1 tablet (400 mg total) by mouth 2 (two) times daily. 60 tablet 3   albuterol  (VENTOLIN  HFA) 108 (90 Base) MCG/ACT inhaler Inhale 2 puffs into the lungs every 6 (six) hours as needed for wheezing or shortness of breath. 8 g 2   ALPRAZolam  (XANAX ) 0.5 MG tablet Take 0.5-1 tablets (0.25-0.5 mg total) by mouth 2 (two) times daily as needed for anxiety. 30 tablet 0   aspirin 81 MG tablet Take by mouth.     Blood Glucose Monitoring Suppl (ONE TOUCH ULTRA 2) w/Device KIT 1 each by Does not apply route daily at 2 PM. 1 kit 0   gabapentin  (NEURONTIN ) 100 MG capsule Take 1 capsule (100 mg total) by mouth 2 (two) times daily. 60 capsule 0   glipiZIDE  (GLUCOTROL  XL) 5 MG 24 hr tablet TAKE 1 TABLET(5 MG) BY MOUTH DAILY WITH BREAKFAST 90 tablet 1   glucose blood (ONETOUCH ULTRA) test strip 1 each by Other route daily. Use as instructed 100 each 12   hydrochlorothiazide  (HYDRODIURIL ) 12.5 MG tablet Take 1 tablet (12.5 mg total) by mouth daily. 90 tablet 3   HYDROcodone -acetaminophen  (NORCO/VICODIN) 5-325 MG tablet Take 1-2 tablets by mouth every 4 (four) hours as needed for moderate pain (pain score 4-6). 60 tablet 0   lactulose  (CHRONULAC ) 10 GM/15ML solution Take 15 mLs (10 g total) by mouth 2 (two) times daily as needed for mild constipation. 236 mL 1   latanoprost (XALATAN) 0.005 %  ophthalmic solution 1 drop at bedtime.      lenalidomide  (REVLIMID ) 25 MG capsule Take 1 capsule (25 mg total) by mouth daily. Take for 14 days, then hold for 7 days. Repeat every 21 days. 14 capsule 0   metFORMIN  (GLUCOPHAGE ) 1000 MG tablet Take 1 tablet (1,000 mg total) by mouth 2 (two) times daily with a meal. 180 tablet 3   montelukast  (SINGULAIR ) 10 MG tablet Take 1 tablet (10 mg total) by mouth daily as needed (wheezing). 10 tablet 0   ondansetron  (ZOFRAN ) 8 MG tablet Take 1 tablet (8 mg total) by mouth every 8 (eight) hours as needed for nausea or vomiting. 30 tablet 1   simvastatin  (ZOCOR ) 10 MG tablet TAKE 1 TABLET(10 MG) BY MOUTH DAILY AT 6 PM 90 tablet 2   morphine  (MS CONTIN ) 15 MG 12 hr tablet Take 1 tablet (15 mg total) by mouth every 8 (eight) hours. 90 tablet 0   naloxone  (NARCAN ) nasal spray 4 mg/0.1 mL SPRAY 1 SPRAY INTO ONE NOSTRIL AS DIRECTED FOR OPIOID OVERDOSE (TURN PERSON ON SIDE AFTER DOSE. IF NO RESPONSE IN 2-3 MINUTES OR PERSON RESPONDS BUT RELAPSES, REPEAT USING A NEW SPRAY DEVICE AND SPRAY INTO THE OTHER NOSTRIL. CALL 911 AFTER USE.) * EMERGENCY USE ONLY * (Patient not taking: Reported on 02/14/2024) 1 each 0   No current facility-administered medications for this visit.    Review of Systems  Constitutional:  Positive for fatigue. Negative for appetite change, chills and fever.  HENT:   Negative for hearing loss and voice change.   Eyes:  Negative for eye problems.  Respiratory:  Negative for chest tightness and cough.   Cardiovascular:  Negative for chest pain.  Gastrointestinal:  Negative for abdominal distention, abdominal pain and blood in stool.  Endocrine: Negative for hot flashes.  Genitourinary:  Negative for difficulty urinating and frequency.   Musculoskeletal:  Positive for arthralgias.       Shoulder, upper chest, hip pain improved.   Skin:  Negative for itching and rash.  Neurological:  Negative for extremity weakness.  Hematological:  Negative for adenopathy.  Psychiatric/Behavioral:  Negative  for confusion.      PHYSICAL EXAMINATION: ECOG PERFORMANCE STATUS: 1 - Symptomatic but completely ambulatory  Vitals:   02/14/24 0915  BP: 113/66  Pulse: 81  Resp: 18  Temp: 98 F (36.7 C)  SpO2: 98%   Filed Weights   02/14/24 0915  Weight: 170 lb 4.8 oz (77.2 kg)    Physical Exam Constitutional:      General: She is not in acute distress.    Appearance: She is not diaphoretic.  HENT:     Head: Normocephalic and atraumatic.  Eyes:     General: No scleral icterus. Cardiovascular:     Rate and Rhythm: Normal rate. Rhythm irregular.     Heart sounds: No murmur heard. Pulmonary:     Effort: Pulmonary effort is normal. No respiratory distress.     Breath sounds: Normal breath sounds. No wheezing.  Abdominal:     General: Bowel sounds are normal. There is no distension.     Palpations: Abdomen is soft.     Tenderness: There is no abdominal tenderness.  Musculoskeletal:        General: Normal range of motion.     Cervical back: Normal range of motion and neck supple.  Skin:    General: Skin is warm and dry.     Findings: No erythema.  Neurological:     Mental  Status: She is alert and oriented to person, place, and time. Mental status is at baseline.     Cranial Nerves: No cranial nerve deficit.     Motor: No abnormal muscle tone.     Coordination: Coordination normal.  Psychiatric:        Mood and Affect: Mood and affect normal.      LABORATORY DATA:  I have reviewed the data as listed    Latest Ref Rng & Units 02/14/2024    9:04 AM 02/07/2024    8:31 AM 01/31/2024    8:50 AM  CBC  WBC 4.0 - 10.5 K/uL 4.3  5.8  4.1   Hemoglobin 12.0 - 15.0 g/dL 89.0  89.3  9.9   Hematocrit 36.0 - 46.0 % 33.1  32.4  30.7   Platelets 150 - 400 K/uL 233  80  118       Latest Ref Rng & Units 02/14/2024    9:04 AM 01/31/2024    8:50 AM 01/24/2024    8:34 AM  CMP  Glucose 70 - 99 mg/dL 852  884  88   BUN 8 - 23 mg/dL 8  8  10    Creatinine 0.44 - 1.00 mg/dL 9.50  9.45  9.47    Sodium 135 - 145 mmol/L 137  136  138   Potassium 3.5 - 5.1 mmol/L 3.6  4.1  4.5   Chloride 98 - 111 mmol/L 99  101  104   CO2 22 - 32 mmol/L 29  28  25    Calcium  8.9 - 10.3 mg/dL 8.7  8.7  8.7   Total Protein 6.5 - 8.1 g/dL 6.3  6.8  7.1   Total Bilirubin 0.0 - 1.2 mg/dL 1.1  0.7  0.4   Alkaline Phos 38 - 126 U/L 124  142  138   AST 15 - 41 U/L 20  18  19    ALT 0 - 44 U/L 14  14  17       RADIOGRAPHIC STUDIES: I have personally reviewed the radiological images as listed and agreed with the findings in the report. US  Venous Img Lower Bilateral Result Date: 02/10/2024 CLINICAL DATA:  Bilateral lower extremity edema since beginning chemotherapy. EXAM: BILATERAL LOWER EXTREMITY VENOUS DOPPLER ULTRASOUND TECHNIQUE: Gray-scale sonography with graded compression, as well as color Doppler and duplex ultrasound were performed to evaluate the lower extremity deep venous systems from the level of the common femoral vein and including the common femoral, femoral, profunda femoral, popliteal and calf veins including the posterior tibial, peroneal and gastrocnemius veins when visible. The superficial great saphenous vein was also interrogated. Spectral Doppler was utilized to evaluate flow at rest and with distal augmentation maneuvers in the common femoral, femoral and popliteal veins. COMPARISON:  None Available. FINDINGS: RIGHT LOWER EXTREMITY Common Femoral Vein: No evidence of thrombus. Normal compressibility, respiratory phasicity and response to augmentation. Saphenofemoral Junction: No evidence of thrombus. Normal compressibility and flow on color Doppler imaging. Profunda Femoral Vein: No evidence of thrombus. Normal compressibility and flow on color Doppler imaging. Femoral Vein: No evidence of thrombus. Normal compressibility, respiratory phasicity and response to augmentation. Popliteal Vein: No evidence of thrombus. Normal compressibility, respiratory phasicity and response to augmentation. Calf  Veins: No evidence of thrombus. Normal compressibility and flow on color Doppler imaging. Superficial Great Saphenous Vein: No evidence of thrombus. Normal compressibility. Venous Reflux:  None. Other Findings:  None. LEFT LOWER EXTREMITY Common Femoral Vein: No evidence of thrombus. Normal compressibility, respiratory phasicity and response  to augmentation. Saphenofemoral Junction: No evidence of thrombus. Normal compressibility and flow on color Doppler imaging. Profunda Femoral Vein: No evidence of thrombus. Normal compressibility and flow on color Doppler imaging. Femoral Vein: No evidence of thrombus. Normal compressibility, respiratory phasicity and response to augmentation. Popliteal Vein: No evidence of thrombus. Normal compressibility, respiratory phasicity and response to augmentation. Calf Veins: No evidence of thrombus. Normal compressibility and flow on color Doppler imaging. Superficial Great Saphenous Vein: No evidence of thrombus. Normal compressibility. Venous Reflux:  None. Other Findings:  None. IMPRESSION: No evidence of deep venous thrombosis in either lower extremity. Electronically Signed   By: Wilkie Lent M.D.   On: 02/10/2024 15:33

## 2024-02-14 NOTE — Assessment & Plan Note (Signed)
 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 S/p Zometa  4 mg x 1 -emergent use. Improved.

## 2024-02-14 NOTE — Assessment & Plan Note (Signed)
 Bone marrow biopsy results, PET scan findings, lab results were reviewed and discussed with patient. Diagnosis of IgA kappa multiple myeloma was reviewed and discussed.  Lab Results  Component Value Date   MPROTEIN 1.7 (H) 01/24/2024   KPAFRELGTCHN 160.8 (H) 01/24/2024   LAMBDASER 3.9 (L) 01/24/2024   KAPLAMBRATIO 41.23 (H) 01/24/2024    Normal Cytogenetics and multiple myeloma FISH testing results are pending. Patient is a transplant candidate.   Labs are reviewed and discussed with patient. M protein and light chain levels have responded very well.  Proceed with cycle 3  Dara RVD.  Refer patient to [facility] oncology to establish care for evaluation of bone marrow transplant.  Thrombosis prophylaxis, aspirin 81 mg daily. Shingle prophylaxis: acyclovir  She has ongoing dental issue, not cleared by dentist for further zometa  treatments. She has ongoing dental work

## 2024-02-17 ENCOUNTER — Encounter: Payer: Self-pay | Admitting: Oncology

## 2024-02-17 ENCOUNTER — Other Ambulatory Visit: Payer: Self-pay

## 2024-02-17 ENCOUNTER — Inpatient Hospital Stay

## 2024-02-17 VITALS — BP 127/53 | HR 80 | Temp 98.2°F | Resp 16

## 2024-02-17 DIAGNOSIS — C9000 Multiple myeloma not having achieved remission: Secondary | ICD-10-CM

## 2024-02-17 DIAGNOSIS — D539 Nutritional anemia, unspecified: Secondary | ICD-10-CM

## 2024-02-17 DIAGNOSIS — G893 Neoplasm related pain (acute) (chronic): Secondary | ICD-10-CM

## 2024-02-17 DIAGNOSIS — M898X9 Other specified disorders of bone, unspecified site: Secondary | ICD-10-CM

## 2024-02-17 DIAGNOSIS — Z5111 Encounter for antineoplastic chemotherapy: Secondary | ICD-10-CM | POA: Diagnosis not present

## 2024-02-17 DIAGNOSIS — Z5112 Encounter for antineoplastic immunotherapy: Secondary | ICD-10-CM | POA: Diagnosis not present

## 2024-02-17 DIAGNOSIS — C7951 Secondary malignant neoplasm of bone: Secondary | ICD-10-CM

## 2024-02-17 LAB — MULTIPLE MYELOMA PANEL, SERUM
Albumin SerPl Elph-Mcnc: 3.3 g/dL (ref 2.9–4.4)
Albumin/Glob SerPl: 1.3 (ref 0.7–1.7)
Alpha 1: 0.3 g/dL (ref 0.0–0.4)
Alpha2 Glob SerPl Elph-Mcnc: 0.8 g/dL (ref 0.4–1.0)
B-Globulin SerPl Elph-Mcnc: 1.2 g/dL (ref 0.7–1.3)
Gamma Glob SerPl Elph-Mcnc: 0.4 g/dL (ref 0.4–1.8)
Globulin, Total: 2.6 g/dL (ref 2.2–3.9)
IgA: 282 mg/dL (ref 87–352)
IgG (Immunoglobin G), Serum: 456 mg/dL — ABNORMAL LOW (ref 586–1602)
IgM (Immunoglobulin M), Srm: 9 mg/dL — ABNORMAL LOW (ref 26–217)
M Protein SerPl Elph-Mcnc: 0.4 g/dL — ABNORMAL HIGH
Total Protein ELP: 5.9 g/dL — ABNORMAL LOW (ref 6.0–8.5)

## 2024-02-17 LAB — KAPPA/LAMBDA LIGHT CHAINS
Kappa free light chain: 44.4 mg/L — ABNORMAL HIGH (ref 3.3–19.4)
Kappa, lambda light chain ratio: 5.92 — ABNORMAL HIGH (ref 0.26–1.65)
Lambda free light chains: 7.5 mg/L (ref 5.7–26.3)

## 2024-02-17 MED ORDER — BORTEZOMIB CHEMO SQ INJECTION 3.5 MG (2.5MG/ML)
1.3000 mg/m2 | Freq: Once | INTRAMUSCULAR | Status: AC
  Administered 2024-02-17: 2.5 mg via SUBCUTANEOUS
  Filled 2024-02-17: qty 1

## 2024-02-17 MED ORDER — PROCHLORPERAZINE MALEATE 10 MG PO TABS
10.0000 mg | ORAL_TABLET | Freq: Once | ORAL | Status: AC
  Administered 2024-02-17: 10 mg via ORAL
  Filled 2024-02-17: qty 1

## 2024-02-17 MED ORDER — HYDROCODONE-ACETAMINOPHEN 5-325 MG PO TABS: 1.0000 | ORAL_TABLET | ORAL | 0 refills | Status: DC | PRN

## 2024-02-17 MED ORDER — MORPHINE SULFATE ER 15 MG PO TBCR: 15.0000 mg | EXTENDED_RELEASE_TABLET | Freq: Three times a day (TID) | ORAL | 0 refills | Status: DC

## 2024-02-17 MED ORDER — DIPHENHYDRAMINE HCL 25 MG PO CAPS
50.0000 mg | ORAL_CAPSULE | Freq: Once | ORAL | Status: AC
  Administered 2024-02-17: 50 mg via ORAL
  Filled 2024-02-17: qty 2

## 2024-02-17 NOTE — Patient Instructions (Signed)
 [facility] - A DEPT OF [facility]  Discharge Instructions: Thank you for choosing [facility] to provide your oncology and hematology care.  If you have a lab appointment with the Cancer Center, please go directly to the Cancer Center and check in at the registration area.  Wear comfortable clothing and clothing appropriate for easy access to any Portacath or PICC line.   We strive to give you quality time with your provider. You may need to reschedule your appointment if you arrive late (15 or more minutes).  Arriving late affects you and other patients whose appointments are after yours.  Also, if you miss three or more appointments without notifying the office, you may be dismissed from the clinic at the provider's discretion.      For prescription refill requests, have your pharmacy contact our office and allow 72 hours for refills to be completed.    Today you received the following chemotherapy and/or immunotherapy agents- velcade      To help prevent nausea and vomiting after your treatment, we encourage you to take your nausea medication as directed.  BELOW ARE SYMPTOMS THAT SHOULD BE REPORTED IMMEDIATELY: *FEVER GREATER THAN 100.4 F (38 C) OR HIGHER *CHILLS OR SWEATING *NAUSEA AND VOMITING THAT IS NOT CONTROLLED WITH YOUR NAUSEA MEDICATION *UNUSUAL SHORTNESS OF BREATH *UNUSUAL BRUISING OR BLEEDING *URINARY PROBLEMS (pain or burning when urinating, or frequent urination) *BOWEL PROBLEMS (unusual diarrhea, constipation, pain near the anus) TENDERNESS IN MOUTH AND THROAT WITH OR WITHOUT PRESENCE OF ULCERS (sore throat, sores in mouth, or a toothache) UNUSUAL RASH, SWELLING OR PAIN  UNUSUAL VAGINAL DISCHARGE OR ITCHING   Items with * indicate a potential emergency and should be followed up as soon as possible or go to the Emergency Department if any problems should occur.  Please show the CHEMOTHERAPY ALERT CARD or IMMUNOTHERAPY  ALERT CARD at check-in to the Emergency Department and triage nurse.  Should you have questions after your visit or need to cancel or reschedule your appointment, please contact [facility] - A DEPT OF [facility]  [phone] and follow the prompts.  Office hours are 8:00 a.m. to 4:30 p.m. Monday - Friday. Please note that voicemails left after 4:00 p.m. may not be returned until the following business day.  We are closed weekends and major holidays. You have access to a nurse at all times for urgent questions. Please call the main number to the clinic [phone] and follow the prompts.  For any non-urgent questions, you may also contact your provider using MyChart. We now offer e-Visits for anyone 47 and older to request care online for non-urgent symptoms. For details visit [email or web address].   Also download the MyChart app! Go to the app store, search "MyChart", open the app, select [place], and log in with your MyChart username and password.

## 2024-02-18 ENCOUNTER — Telehealth: Payer: Self-pay

## 2024-02-18 NOTE — Telephone Encounter (Signed)
 Referral faxed to [facility] oncology (Dr. Rayfield). Fax confirmation received.   Ph: [phone] Fax: [phone]

## 2024-02-21 ENCOUNTER — Inpatient Hospital Stay: Attending: Oncology

## 2024-02-21 ENCOUNTER — Inpatient Hospital Stay

## 2024-02-21 VITALS — BP 108/46 | HR 56 | Temp 97.9°F | Resp 16

## 2024-02-21 DIAGNOSIS — G893 Neoplasm related pain (acute) (chronic): Secondary | ICD-10-CM | POA: Diagnosis not present

## 2024-02-21 DIAGNOSIS — Z5111 Encounter for antineoplastic chemotherapy: Secondary | ICD-10-CM | POA: Insufficient documentation

## 2024-02-21 DIAGNOSIS — D539 Nutritional anemia, unspecified: Secondary | ICD-10-CM | POA: Insufficient documentation

## 2024-02-21 DIAGNOSIS — Z5112 Encounter for antineoplastic immunotherapy: Secondary | ICD-10-CM | POA: Diagnosis not present

## 2024-02-21 DIAGNOSIS — C9000 Multiple myeloma not having achieved remission: Secondary | ICD-10-CM

## 2024-02-21 LAB — CBC WITH DIFFERENTIAL (CANCER CENTER ONLY)
Abs Immature Granulocytes: 0.02 K/uL (ref 0.00–0.07)
Basophils Absolute: 0.1 K/uL (ref 0.0–0.1)
Basophils Relative: 1 %
Eosinophils Absolute: 0.7 K/uL — ABNORMAL HIGH (ref 0.0–0.5)
Eosinophils Relative: 14 %
HCT: 34.8 % — ABNORMAL LOW (ref 36.0–46.0)
Hemoglobin: 11.5 g/dL — ABNORMAL LOW (ref 12.0–15.0)
Immature Granulocytes: 0 %
Lymphocytes Relative: 9 %
Lymphs Abs: 0.4 K/uL — ABNORMAL LOW (ref 0.7–4.0)
MCH: 31.6 pg (ref 26.0–34.0)
MCHC: 33 g/dL (ref 30.0–36.0)
MCV: 95.6 fL (ref 80.0–100.0)
Monocytes Absolute: 0.4 K/uL (ref 0.1–1.0)
Monocytes Relative: 8 %
Neutro Abs: 3.5 K/uL (ref 1.7–7.7)
Neutrophils Relative %: 68 %
Platelet Count: 138 K/uL — ABNORMAL LOW (ref 150–400)
RBC: 3.64 MIL/uL — ABNORMAL LOW (ref 3.87–5.11)
RDW: 14.5 % (ref 11.5–15.5)
WBC Count: 5.1 K/uL (ref 4.0–10.5)
nRBC: 0 % (ref 0.0–0.2)

## 2024-02-21 LAB — CMP (CANCER CENTER ONLY)
ALT: 11 U/L (ref 0–44)
AST: 17 U/L (ref 15–41)
Albumin: 3.7 g/dL (ref 3.5–5.0)
Alkaline Phosphatase: 109 U/L (ref 38–126)
Anion gap: 9 (ref 5–15)
BUN: 10 mg/dL (ref 8–23)
CO2: 28 mmol/L (ref 22–32)
Calcium: 8.6 mg/dL — ABNORMAL LOW (ref 8.9–10.3)
Chloride: 100 mmol/L (ref 98–111)
Creatinine: 0.54 mg/dL (ref 0.44–1.00)
GFR, Estimated: >60 mL/min (ref >60)
Glucose, Bld: 132 mg/dL — ABNORMAL HIGH (ref 70–99)
Potassium: 3.9 mmol/L (ref 3.5–5.1)
Sodium: 137 mmol/L (ref 135–145)
Total Bilirubin: 1 mg/dL (ref 0.0–1.2)
Total Protein: 6.1 g/dL — ABNORMAL LOW (ref 6.5–8.1)

## 2024-02-21 MED ORDER — DIPHENHYDRAMINE HCL 25 MG PO CAPS
50.0000 mg | ORAL_CAPSULE | Freq: Once | ORAL | Status: AC
  Administered 2024-02-21: 50 mg via ORAL
  Filled 2024-02-21: qty 2

## 2024-02-21 MED ORDER — BORTEZOMIB CHEMO SQ INJECTION 3.5 MG (2.5MG/ML)
1.3000 mg/m2 | Freq: Once | INTRAMUSCULAR | Status: AC
  Administered 2024-02-21: 2.5 mg via SUBCUTANEOUS
  Filled 2024-02-21: qty 1

## 2024-02-21 MED ORDER — DARATUMUMAB-HYALURONIDASE-FIHJ 1800-30000 MG-UT/15ML ~~LOC~~ SOLN
1800.0000 mg | Freq: Once | SUBCUTANEOUS | Status: AC
  Administered 2024-02-21: 1800 mg via SUBCUTANEOUS
  Filled 2024-02-21: qty 15

## 2024-02-21 MED ORDER — DEXAMETHASONE 4 MG PO TABS
20.0000 mg | ORAL_TABLET | Freq: Once | ORAL | Status: AC
  Administered 2024-02-21: 20 mg via ORAL
  Filled 2024-02-21: qty 5

## 2024-02-21 MED ORDER — ACETAMINOPHEN 325 MG PO TABS
650.0000 mg | ORAL_TABLET | Freq: Once | ORAL | Status: AC
  Administered 2024-02-21: 650 mg via ORAL
  Filled 2024-02-21: qty 2

## 2024-02-21 NOTE — Patient Instructions (Signed)
 [facility] - A DEPT OF [facility]  Discharge Instructions: Thank you for choosing [facility] to provide your oncology and hematology care.  If you have a lab appointment with the Cancer Center, please go directly to the Cancer Center and check in at the registration area.  Wear comfortable clothing and clothing appropriate for easy access to any Portacath or PICC line.   We strive to give you quality time with your provider. You may need to reschedule your appointment if you arrive late (15 or more minutes).  Arriving late affects you and other patients whose appointments are after yours.  Also, if you miss three or more appointments without notifying the office, you may be dismissed from the clinic at the provider's discretion.      For prescription refill requests, have your pharmacy contact our office and allow 72 hours for refills to be completed.    Today you received the following chemotherapy and/or immunotherapy agents- darzalex , velcade       To help prevent nausea and vomiting after your treatment, we encourage you to take your nausea medication as directed.  BELOW ARE SYMPTOMS THAT SHOULD BE REPORTED IMMEDIATELY: *FEVER GREATER THAN 100.4 F (38 C) OR HIGHER *CHILLS OR SWEATING *NAUSEA AND VOMITING THAT IS NOT CONTROLLED WITH YOUR NAUSEA MEDICATION *UNUSUAL SHORTNESS OF BREATH *UNUSUAL BRUISING OR BLEEDING *URINARY PROBLEMS (pain or burning when urinating, or frequent urination) *BOWEL PROBLEMS (unusual diarrhea, constipation, pain near the anus) TENDERNESS IN MOUTH AND THROAT WITH OR WITHOUT PRESENCE OF ULCERS (sore throat, sores in mouth, or a toothache) UNUSUAL RASH, SWELLING OR PAIN  UNUSUAL VAGINAL DISCHARGE OR ITCHING   Items with * indicate a potential emergency and should be followed up as soon as possible or go to the Emergency Department if any problems should occur.  Please show the CHEMOTHERAPY ALERT CARD or  IMMUNOTHERAPY ALERT CARD at check-in to the Emergency Department and triage nurse.  Should you have questions after your visit or need to cancel or reschedule your appointment, please contact [facility] - A DEPT OF [facility]  [phone] and follow the prompts.  Office hours are 8:00 a.m. to 4:30 p.m. Monday - Friday. Please note that voicemails left after 4:00 p.m. may not be returned until the following business day.  We are closed weekends and major holidays. You have access to a nurse at all times for urgent questions. Please call the main number to the clinic [phone] and follow the prompts.  For any non-urgent questions, you may also contact your provider using MyChart. We now offer e-Visits for anyone 30 and older to request care online for non-urgent symptoms. For details visit [email or web address].   Also download the MyChart app! Go to the app store, search MyChart, open the app, select [place], and log in with your MyChart username and password.

## 2024-02-24 ENCOUNTER — Other Ambulatory Visit: Payer: Self-pay

## 2024-02-24 ENCOUNTER — Inpatient Hospital Stay

## 2024-02-24 ENCOUNTER — Encounter: Payer: Self-pay | Admitting: Family Medicine

## 2024-02-24 VITALS — BP 139/62 | HR 88 | Temp 98.1°F | Resp 18 | Wt 176.4 lb

## 2024-02-24 DIAGNOSIS — Z5112 Encounter for antineoplastic immunotherapy: Secondary | ICD-10-CM | POA: Diagnosis not present

## 2024-02-24 DIAGNOSIS — G893 Neoplasm related pain (acute) (chronic): Secondary | ICD-10-CM | POA: Diagnosis not present

## 2024-02-24 DIAGNOSIS — C9000 Multiple myeloma not having achieved remission: Secondary | ICD-10-CM | POA: Diagnosis not present

## 2024-02-24 DIAGNOSIS — D539 Nutritional anemia, unspecified: Secondary | ICD-10-CM | POA: Diagnosis not present

## 2024-02-24 DIAGNOSIS — F32A Depression, unspecified: Secondary | ICD-10-CM

## 2024-02-24 DIAGNOSIS — Z5111 Encounter for antineoplastic chemotherapy: Secondary | ICD-10-CM | POA: Diagnosis not present

## 2024-02-24 DIAGNOSIS — F419 Anxiety disorder, unspecified: Secondary | ICD-10-CM

## 2024-02-24 MED ORDER — PROCHLORPERAZINE MALEATE 10 MG PO TABS
10.0000 mg | ORAL_TABLET | Freq: Once | ORAL | Status: AC
  Administered 2024-02-24: 10 mg via ORAL
  Filled 2024-02-24: qty 1

## 2024-02-24 MED ORDER — BORTEZOMIB CHEMO SQ INJECTION 3.5 MG (2.5MG/ML)
1.3000 mg/m2 | Freq: Once | INTRAMUSCULAR | Status: AC
  Administered 2024-02-24: 2.5 mg via SUBCUTANEOUS
  Filled 2024-02-24: qty 1

## 2024-02-24 MED ORDER — DIPHENHYDRAMINE HCL 25 MG PO CAPS
50.0000 mg | ORAL_CAPSULE | Freq: Once | ORAL | Status: AC
  Administered 2024-02-24: 50 mg via ORAL
  Filled 2024-02-24: qty 2

## 2024-02-25 ENCOUNTER — Other Ambulatory Visit: Payer: Self-pay | Admitting: Oncology

## 2024-02-25 DIAGNOSIS — C9000 Multiple myeloma not having achieved remission: Secondary | ICD-10-CM

## 2024-02-25 MED ORDER — ALPRAZOLAM 0.5 MG PO TABS: 0.2500 mg | ORAL_TABLET | Freq: Two times a day (BID) | ORAL | 0 refills | Status: DC | PRN

## 2024-02-26 ENCOUNTER — Encounter: Payer: Self-pay | Admitting: Oncology

## 2024-02-26 ENCOUNTER — Other Ambulatory Visit: Payer: Self-pay

## 2024-02-26 DIAGNOSIS — C9000 Multiple myeloma not having achieved remission: Secondary | ICD-10-CM

## 2024-02-26 MED ORDER — LENALIDOMIDE 25 MG PO CAPS: ORAL_CAPSULE | ORAL | 0 refills | Status: DC

## 2024-02-26 NOTE — Telephone Encounter (Signed)
 Patient scheduled to see Dr. Rayfield on 11/4

## 2024-02-26 NOTE — Addendum Note (Signed)
 Addended by: HEROLD ALMARIE PARAS on: 02/26/2024 09:26 AM   Modules accepted: Orders

## 2024-02-28 ENCOUNTER — Inpatient Hospital Stay

## 2024-02-28 ENCOUNTER — Ambulatory Visit: Admitting: Oncology

## 2024-02-28 VITALS — BP 124/50 | HR 44 | Temp 97.0°F | Resp 17

## 2024-02-28 DIAGNOSIS — C9000 Multiple myeloma not having achieved remission: Secondary | ICD-10-CM

## 2024-02-28 DIAGNOSIS — Z5111 Encounter for antineoplastic chemotherapy: Secondary | ICD-10-CM | POA: Diagnosis not present

## 2024-02-28 LAB — CBC WITH DIFFERENTIAL (CANCER CENTER ONLY)
Abs Immature Granulocytes: 0.03 K/uL (ref 0.00–0.07)
Basophils Absolute: 0 K/uL (ref 0.0–0.1)
Basophils Relative: 1 %
Eosinophils Absolute: 0.4 K/uL (ref 0.0–0.5)
Eosinophils Relative: 6 %
HCT: 35 % — ABNORMAL LOW (ref 36.0–46.0)
Hemoglobin: 11.2 g/dL — ABNORMAL LOW (ref 12.0–15.0)
Immature Granulocytes: 0 %
Lymphocytes Relative: 9 %
Lymphs Abs: 0.6 K/uL — ABNORMAL LOW (ref 0.7–4.0)
MCH: 31.3 pg (ref 26.0–34.0)
MCHC: 32 g/dL (ref 30.0–36.0)
MCV: 97.8 fL (ref 80.0–100.0)
Monocytes Absolute: 1.1 K/uL — ABNORMAL HIGH (ref 0.1–1.0)
Monocytes Relative: 15 %
Neutro Abs: 4.8 K/uL (ref 1.7–7.7)
Neutrophils Relative %: 69 %
Platelet Count: 72 K/uL — ABNORMAL LOW (ref 150–400)
RBC: 3.58 MIL/uL — ABNORMAL LOW (ref 3.87–5.11)
RDW: 14.9 % (ref 11.5–15.5)
WBC Count: 7 K/uL (ref 4.0–10.5)
nRBC: 0 % (ref 0.0–0.2)

## 2024-02-28 MED ORDER — ACETAMINOPHEN 325 MG PO TABS
650.0000 mg | ORAL_TABLET | Freq: Once | ORAL | Status: AC
  Administered 2024-02-28: 650 mg via ORAL
  Filled 2024-02-28: qty 2

## 2024-02-28 MED ORDER — DIPHENHYDRAMINE HCL 25 MG PO CAPS
50.0000 mg | ORAL_CAPSULE | Freq: Once | ORAL | Status: AC
  Administered 2024-02-28: 50 mg via ORAL
  Filled 2024-02-28: qty 2

## 2024-02-28 MED ORDER — DARATUMUMAB-HYALURONIDASE-FIHJ 1800-30000 MG-UT/15ML ~~LOC~~ SOLN
1800.0000 mg | Freq: Once | SUBCUTANEOUS | Status: AC
  Administered 2024-02-28: 1800 mg via SUBCUTANEOUS
  Filled 2024-02-28: qty 15

## 2024-02-28 MED ORDER — DEXAMETHASONE 4 MG PO TABS
20.0000 mg | ORAL_TABLET | Freq: Once | ORAL | Status: AC
  Administered 2024-02-28: 20 mg via ORAL
  Filled 2024-02-28: qty 5

## 2024-03-06 ENCOUNTER — Inpatient Hospital Stay

## 2024-03-06 ENCOUNTER — Inpatient Hospital Stay: Admitting: Oncology

## 2024-03-06 ENCOUNTER — Encounter: Payer: Self-pay | Admitting: Oncology

## 2024-03-06 VITALS — BP 133/80 | HR 76 | Temp 97.6°F | Resp 18 | Wt 179.8 lb

## 2024-03-06 DIAGNOSIS — Z5111 Encounter for antineoplastic chemotherapy: Secondary | ICD-10-CM | POA: Diagnosis not present

## 2024-03-06 DIAGNOSIS — C9000 Multiple myeloma not having achieved remission: Secondary | ICD-10-CM

## 2024-03-06 DIAGNOSIS — G893 Neoplasm related pain (acute) (chronic): Secondary | ICD-10-CM

## 2024-03-06 DIAGNOSIS — D539 Nutritional anemia, unspecified: Secondary | ICD-10-CM

## 2024-03-06 DIAGNOSIS — Z5112 Encounter for antineoplastic immunotherapy: Secondary | ICD-10-CM | POA: Diagnosis not present

## 2024-03-06 DIAGNOSIS — D696 Thrombocytopenia, unspecified: Secondary | ICD-10-CM | POA: Diagnosis not present

## 2024-03-06 LAB — CBC WITH DIFFERENTIAL (CANCER CENTER ONLY)
Abs Immature Granulocytes: 0.02 K/uL (ref 0.00–0.07)
Basophils Absolute: 0.1 K/uL (ref 0.0–0.1)
Basophils Relative: 2 %
Eosinophils Absolute: 0.2 K/uL (ref 0.0–0.5)
Eosinophils Relative: 4 %
HCT: 33.4 % — ABNORMAL LOW (ref 36.0–46.0)
Hemoglobin: 10.8 g/dL — ABNORMAL LOW (ref 12.0–15.0)
Immature Granulocytes: 0 %
Lymphocytes Relative: 13 %
Lymphs Abs: 0.8 K/uL (ref 0.7–4.0)
MCH: 30.9 pg (ref 26.0–34.0)
MCHC: 32.3 g/dL (ref 30.0–36.0)
MCV: 95.4 fL (ref 80.0–100.0)
Monocytes Absolute: 0.9 K/uL (ref 0.1–1.0)
Monocytes Relative: 16 %
Neutro Abs: 3.8 K/uL (ref 1.7–7.7)
Neutrophils Relative %: 65 %
Platelet Count: 212 K/uL (ref 150–400)
RBC: 3.5 MIL/uL — ABNORMAL LOW (ref 3.87–5.11)
RDW: 15.6 % — ABNORMAL HIGH (ref 11.5–15.5)
WBC Count: 5.7 K/uL (ref 4.0–10.5)
nRBC: 0 % (ref 0.0–0.2)

## 2024-03-06 LAB — CMP (CANCER CENTER ONLY)
ALT: 15 U/L (ref 0–44)
AST: 23 U/L (ref 15–41)
Albumin: 3.6 g/dL (ref 3.5–5.0)
Alkaline Phosphatase: 93 U/L (ref 38–126)
Anion gap: 9 (ref 5–15)
BUN: 12 mg/dL (ref 8–23)
CO2: 26 mmol/L (ref 22–32)
Calcium: 8.7 mg/dL — ABNORMAL LOW (ref 8.9–10.3)
Chloride: 105 mmol/L (ref 98–111)
Creatinine: 0.45 mg/dL (ref 0.44–1.00)
GFR, Estimated: >60 mL/min (ref >60)
Glucose, Bld: 136 mg/dL — ABNORMAL HIGH (ref 70–99)
Potassium: 4.2 mmol/L (ref 3.5–5.1)
Sodium: 140 mmol/L (ref 135–145)
Total Bilirubin: 0.8 mg/dL (ref 0.0–1.2)
Total Protein: 6 g/dL — ABNORMAL LOW (ref 6.5–8.1)

## 2024-03-06 MED ORDER — BORTEZOMIB CHEMO SQ INJECTION 3.5 MG (2.5MG/ML)
1.3000 mg/m2 | Freq: Once | INTRAMUSCULAR | Status: AC
  Administered 2024-03-06: 2.5 mg via SUBCUTANEOUS
  Filled 2024-03-06: qty 1

## 2024-03-06 MED ORDER — DARATUMUMAB-HYALURONIDASE-FIHJ 1800-30000 MG-UT/15ML ~~LOC~~ SOLN
1800.0000 mg | Freq: Once | SUBCUTANEOUS | Status: AC
  Administered 2024-03-06: 1800 mg via SUBCUTANEOUS
  Filled 2024-03-06: qty 15

## 2024-03-06 MED ORDER — DEXAMETHASONE 4 MG PO TABS
20.0000 mg | ORAL_TABLET | Freq: Once | ORAL | Status: AC
  Administered 2024-03-06: 20 mg via ORAL
  Filled 2024-03-06: qty 5

## 2024-03-06 MED ORDER — DIPHENHYDRAMINE HCL 25 MG PO CAPS
50.0000 mg | ORAL_CAPSULE | Freq: Once | ORAL | Status: AC
  Administered 2024-03-06: 50 mg via ORAL
  Filled 2024-03-06: qty 2

## 2024-03-06 MED ORDER — ACETAMINOPHEN 325 MG PO TABS
650.0000 mg | ORAL_TABLET | Freq: Once | ORAL | Status: AC
  Administered 2024-03-06: 650 mg via ORAL
  Filled 2024-03-06: qty 2

## 2024-03-06 NOTE — Patient Instructions (Signed)
 [facility] - A DEPT OF [facility]  Discharge Instructions: Thank you for choosing [facility] to provide your oncology and hematology care.  If you have a lab appointment with the Cancer Center, please go directly to the Cancer Center and check in at the registration area.  Wear comfortable clothing and clothing appropriate for easy access to any Portacath or PICC line.   We strive to give you quality time with your provider. You may need to reschedule your appointment if you arrive late (15 or more minutes).  Arriving late affects you and other patients whose appointments are after yours.  Also, if you miss three or more appointments without notifying the office, you may be dismissed from the clinic at the provider's discretion.      For prescription refill requests, have your pharmacy contact our office and allow 72 hours for refills to be completed.    Today you received the following chemotherapy and/or immunotherapy agents- darzalex faspro, velcade      To help prevent nausea and vomiting after your treatment, we encourage you to take your nausea medication as directed.  BELOW ARE SYMPTOMS THAT SHOULD BE REPORTED IMMEDIATELY: *FEVER GREATER THAN 100.4 F (38 C) OR HIGHER *CHILLS OR SWEATING *NAUSEA AND VOMITING THAT IS NOT CONTROLLED WITH YOUR NAUSEA MEDICATION *UNUSUAL SHORTNESS OF BREATH *UNUSUAL BRUISING OR BLEEDING *URINARY PROBLEMS (pain or burning when urinating, or frequent urination) *BOWEL PROBLEMS (unusual diarrhea, constipation, pain near the anus) TENDERNESS IN MOUTH AND THROAT WITH OR WITHOUT PRESENCE OF ULCERS (sore throat, sores in mouth, or a toothache) UNUSUAL RASH, SWELLING OR PAIN  UNUSUAL VAGINAL DISCHARGE OR ITCHING   Items with * indicate a potential emergency and should be followed up as soon as possible or go to the Emergency Department if any problems should occur.  Please show the CHEMOTHERAPY ALERT CARD or  IMMUNOTHERAPY ALERT CARD at check-in to the Emergency Department and triage nurse.  Should you have questions after your visit or need to cancel or reschedule your appointment, please contact [facility] - A DEPT OF [facility]  [phone] and follow the prompts.  Office hours are 8:00 a.m. to 4:30 p.m. Monday - Friday. Please note that voicemails left after 4:00 p.m. may not be returned until the following business day.  We are closed weekends and major holidays. You have access to a nurse at all times for urgent questions. Please call the main number to the clinic [phone] and follow the prompts.  For any non-urgent questions, you may also contact your provider using MyChart. We now offer e-Visits for anyone 56 and older to request care online for non-urgent symptoms. For details visit [email or web address].   Also download the MyChart app! Go to the app store, search "MyChart", open the app, select [place], and log in with your MyChart username and password.

## 2024-03-06 NOTE — Progress Notes (Signed)
 Hematology/Oncology Progress note [phone] [phone]       CHIEF COMPLAINTS/PURPOSE OF CONSULTATION:  Multiple myeloma  ASSESSMENT & PLAN:   Multiple myeloma (HCC) Bone marrow biopsy results, PET scan findings, lab results were reviewed and discussed with patient. Diagnosis of IgA kappa multiple myeloma was reviewed and discussed.  Lab Results  Component Value Date   MPROTEIN 0.4 (H) 02/14/2024   KPAFRELGTCHN 44.4 (H) 02/14/2024   LAMBDASER 7.5 02/14/2024   KAPLAMBRATIO 5.92 (H) 02/14/2024    Normal Cytogenetics and multiple myeloma FISH testing results are pending. Patient is a transplant candidate.   Labs are reviewed and discussed with patient. M protein and light chain levels have responded very well. VGPR Proceed with cycle 4  Dara RVD.  She will establish care with [facility] oncology evaluation of bone marrow transplant.  Thrombosis prophylaxis, aspirin 81 mg daily. Shingle prophylaxis: acyclovir  She has ongoing dental issue, not cleared by dentist for further zometa  treatments. She has ongoing dental work  Macrocytic anemia Secondary to multiple myeloma/ chemotherapy  Monitor closely.  Thrombocytopenia Chemotherapy induced. Monitor closely   Orders Placed This Encounter  Procedures   Multiple Myeloma Panel (SPEP&IFE w/QIG)    Standing Status:   Future    Expected Date:   03/27/2024    Expiration Date:   03/27/2025   Kappa/lambda light chains    Standing Status:   Future    Expected Date:   03/27/2024    Expiration Date:   03/27/2025   Kappa/lambda light chains    Standing Status:   Future    Expected Date:   03/27/2024    Expiration Date:   03/27/2025   Multiple Myeloma Panel (SPEP&IFE w/QIG)    Standing Status:   Future    Expected Date:   03/27/2024    Expiration Date:   03/27/2025   CBC with Differential (Cancer Center Only)    Standing Status:   Future    Expected Date:   03/27/2024    Expiration Date:   03/27/2025   CMP (Cancer  Center only)    Standing Status:   Future    Expected Date:   03/27/2024    Expiration Date:   03/27/2025   CBC with Differential (Cancer Center Only)    Standing Status:   Future    Expected Date:   04/03/2024    Expiration Date:   04/03/2025   CMP (Cancer Center only)    Standing Status:   Future    Expected Date:   04/03/2024    Expiration Date:   04/03/2025   Follow-up per LOS All questions were answered. The patient knows to call the clinic with any problems, questions or concerns.  Zelphia Cap, MD, PhD [facility] Hematology Oncology 03/06/2024    HISTORY OF PRESENTING ILLNESS:  Dana Wallace 68 y.o. female presents for follow up of multiple myeloma   Oncology History  Multiple myeloma (HCC)  12/09/2023 Initial Diagnosis   Multiple myeloma (HCC)  She initially experienced chest discomfort starting in her left shoulder in February, which persisted and led her to seek medical attention in April. Despite a comprehensive workup including a chest x-ray, EKG, echocardiogram, mammogram, and a DEXA scan, all results were normal.  In May, she had two follow-up visits with her primary care physician. The discomfort was primarily located behind her left breast and later moved to her right clavicle and sternum area, becoming very painful and tender to touch. An x-ray at [facility] was performed, and  the patient was told there was some type of lesion, after which she underwent an MRI of the right clavicle, shoulder, and scapula. Interval marrow biopsy, lab workup and PET scan. She was found to have stage II IgA kappa multiple myeloma.   12/15/2023 Imaging   PET scan showed  1. Diffuse and extensive lytic bone lesions throughout the axial and appendicular skeleton most likely reflecting multiple myeloma. 2. Pathologic fracture involving the right clavicle. 3. Large soft tissue masses associated with the left second anterior rib and the lower sternum. 4. No findings for a primary  neoplastic process involving the neck, chest, abdomen or pelvis   12/18/2023 Bone Marrow Biopsy   Pulmonary biopsy aspirate, clot, core Showed hypercellular bone marrow, involved by kappa restricted plasma cell neoplasm compromising 30-100%, average 70%, of the cellular marrow.   12/25/2023 Cancer Staging   Staging form: Plasma Cell Myeloma and Plasma Cell Disorders, AJCC 8th Edition - Clinical stage from 12/25/2023: Beta-2 -microglobulin (mg/L): 3.9, Albumin (g/dL): 3.2, ISS: Stage II, LDH: Normal - Signed by Babara Call, MD on 12/25/2023 Stage prefix: Initial diagnosis Beta 2 microglobulin range (mg/L): 3.5 to 5.49 Albumin range (g/dL): Less than 3.5   1/84/7974 -  Chemotherapy   Patient is on Treatment Plan : MYELOMA NEWLY DIAGNOSED TRANSPLANT CANDIDATE DaraVRd (Daratumumab  SQ) q21d x 6 Cycles (Induction/Consolidation)       Pain of upper chest and back, right shoulder, and the left hip have improved. She rarely takes Norco as needed. She takes MS Contin  15 mg Q8h    Accompanied by husband. She reports feeling well.  She has no new complaints.  MEDICAL HISTORY:  Past Medical History:  Diagnosis Date   Anxiety    Diabetes mellitus without complication (HCC)    Hypertension     SURGICAL HISTORY: Past Surgical History:  Procedure Laterality Date   CESAREAN SECTION     CHOLECYSTECTOMY     IR BONE MARROW BIOPSY & ASPIRATION  12/18/2023   TUBAL LIGATION      SOCIAL HISTORY: Social History   Socioeconomic History   Marital status: Married    Spouse name: Not on file   Number of children: Not on file   Years of education: Not on file   Highest education level: Bachelor's degree (e.g., BA, AB, BS)  Occupational History   Not on file  Tobacco Use   Smoking status: Never   Smokeless tobacco: Never  Substance and Sexual Activity   Alcohol use: No   Drug use: Never   Sexual activity: Not Currently    Birth control/protection: None  Other Topics Concern   Not on file  Social  History Narrative   Not on file   Social Drivers of Health   Financial Resource Strain: Low Risk  (01/19/2024)   Overall Financial Resource Strain (CARDIA)    Difficulty of Paying Living Expenses: Not hard at all  Food Insecurity: No Food Insecurity (01/19/2024)   Hunger Vital Sign    Worried About Running Out of Food in the Last Year: Never true    Ran Out of Food in the Last Year: Never true  Transportation Needs: No Transportation Needs (01/19/2024)   PRAPARE - Administrator, Civil Service (Medical): No    Lack of Transportation (Non-Medical): No  Physical Activity: Inactive (01/19/2024)   Exercise Vital Sign    Days of Exercise per Week: 0 days    Minutes of Exercise per Session: Not on file  Stress: Stress Concern Present (01/19/2024)  [employer] of Occupational Health - Occupational Stress Questionnaire    Feeling of Stress: To some extent  Social Connections: Moderately Isolated (01/19/2024)   Social Connection and Isolation Panel    Frequency of Communication with Friends and Family: More than three times a week    Frequency of Social Gatherings with Friends and Family: Once a week    Attends Religious Services: Patient declined    Database administrator or Organizations: No    Attends Engineer, structural: Not on file    Marital Status: Married  Catering manager Violence: Not At Risk (12/09/2023)   Humiliation, Afraid, Rape, and Kick questionnaire    Fear of Current or Ex-Partner: No    Emotionally Abused: No    Physically Abused: No    Sexually Abused: No    FAMILY HISTORY: Family History  Problem Relation Age of Onset   COPD Mother    Multiple sclerosis Mother    Varicose Veins Mother    Hodgkin's lymphoma Sister    Cancer Sister    Breast cancer Neg Hx     ALLERGIES:  has no known allergies.  MEDICATIONS:  Current Outpatient Medications  Medication Sig Dispense Refill   acyclovir  (ZOVIRAX ) 400 MG tablet Take 1 tablet (400 mg  total) by mouth 2 (two) times daily. 60 tablet 3   albuterol  (VENTOLIN  HFA) 108 (90 Base) MCG/ACT inhaler Inhale 2 puffs into the lungs every 6 (six) hours as needed for wheezing or shortness of breath. 8 g 2   ALPRAZolam  (XANAX ) 0.5 MG tablet Take 0.5-1 tablets (0.25-0.5 mg total) by mouth 2 (two) times daily as needed for anxiety. 30 tablet 0   aspirin 81 MG tablet Take by mouth.     Blood Glucose Monitoring Suppl (ONE TOUCH ULTRA 2) w/Device KIT 1 each by Does not apply route daily at 2 PM. 1 kit 0   glipiZIDE  (GLUCOTROL  XL) 5 MG 24 hr tablet TAKE 1 TABLET(5 MG) BY MOUTH DAILY WITH BREAKFAST 90 tablet 1   glucose blood (ONETOUCH ULTRA) test strip 1 each by Other route daily. Use as instructed 100 each 12   hydrochlorothiazide  (HYDRODIURIL ) 12.5 MG tablet Take 1 tablet (12.5 mg total) by mouth daily. 90 tablet 3   HYDROcodone -acetaminophen  (NORCO/VICODIN) 5-325 MG tablet Take 1-2 tablets by mouth every 4 (four) hours as needed for moderate pain (pain score 4-6). 60 tablet 0   lactulose  (CHRONULAC ) 10 GM/15ML solution Take 15 mLs (10 g total) by mouth 2 (two) times daily as needed for mild constipation. 236 mL 1   latanoprost (XALATAN) 0.005 % ophthalmic solution 1 drop at bedtime.     lenalidomide  (REVLIMID ) 25 MG capsule Take 1 capsule by mouth once daily for 14 days on, 7 days off. 14 capsule 0   metFORMIN  (GLUCOPHAGE ) 1000 MG tablet Take 1 tablet (1,000 mg total) by mouth 2 (two) times daily with a meal. 180 tablet 3   montelukast  (SINGULAIR ) 10 MG tablet Take 1 tablet (10 mg total) by mouth daily as needed (wheezing). 10 tablet 0   morphine  (MS CONTIN ) 15 MG 12 hr tablet Take 1 tablet (15 mg total) by mouth every 8 (eight) hours. 90 tablet 0   ondansetron  (ZOFRAN ) 8 MG tablet Take 1 tablet (8 mg total) by mouth every 8 (eight) hours as needed for nausea or vomiting. 30 tablet 1   simvastatin  (ZOCOR ) 10 MG tablet TAKE 1 TABLET(10 MG) BY MOUTH DAILY AT 6 PM 90 tablet 2   gabapentin  (  NEURONTIN )  100 MG capsule Take 1 capsule (100 mg total) by mouth 2 (two) times daily. (Patient not taking: Reported on 03/06/2024) 60 capsule 0   naloxone  (NARCAN ) nasal spray 4 mg/0.1 mL SPRAY 1 SPRAY INTO ONE NOSTRIL AS DIRECTED FOR OPIOID OVERDOSE (TURN PERSON ON SIDE AFTER DOSE. IF NO RESPONSE IN 2-3 MINUTES OR PERSON RESPONDS BUT RELAPSES, REPEAT USING A NEW SPRAY DEVICE AND SPRAY INTO THE OTHER NOSTRIL. CALL 911 AFTER USE.) * EMERGENCY USE ONLY * (Patient not taking: Reported on 03/06/2024) 1 each 0   No current facility-administered medications for this visit.   Facility-Administered Medications Ordered in Other Visits  Medication Dose Route Frequency Provider Last Rate Last Admin   bortezomib  SQ (VELCADE ) chemo injection (2.5mg /mL concentration) 2.5 mg  1.3 mg/m2 (Treatment Plan Recorded) Subcutaneous Once Eulanda Dorion, MD       daratumumab -hyaluronidase -fihj (DARZALEX  FASPRO) 1800-30000 MG-UT/15ML chemo SQ injection 1,800 mg  1,800 mg Subcutaneous Once Babara Call, MD        Review of Systems  Constitutional:  Positive for fatigue. Negative for appetite change, chills and fever.  HENT:   Negative for hearing loss and voice change.   Eyes:  Negative for eye problems.  Respiratory:  Negative for chest tightness and cough.   Cardiovascular:  Negative for chest pain.  Gastrointestinal:  Negative for abdominal distention, abdominal pain and blood in stool.  Endocrine: Negative for hot flashes.  Genitourinary:  Negative for difficulty urinating and frequency.   Musculoskeletal:  Positive for arthralgias.       Shoulder, upper chest, hip pain improved.   Skin:  Negative for itching and rash.  Neurological:  Negative for extremity weakness.  Hematological:  Negative for adenopathy.  Psychiatric/Behavioral:  Negative for confusion.      PHYSICAL EXAMINATION: ECOG PERFORMANCE STATUS: 1 - Symptomatic but completely ambulatory  Vitals:   03/06/24 0902  BP: 133/80  Pulse: 76  Resp: 18  Temp: 97.6 F  (36.4 C)  SpO2: 97%   Filed Weights   03/06/24 0902  Weight: 179 lb 12.8 oz (81.6 kg)    Physical Exam Constitutional:      General: She is not in acute distress.    Appearance: She is not diaphoretic.  HENT:     Head: Normocephalic and atraumatic.  Eyes:     General: No scleral icterus. Cardiovascular:     Rate and Rhythm: Normal rate. Rhythm irregular.     Heart sounds: No murmur heard. Pulmonary:     Effort: Pulmonary effort is normal. No respiratory distress.     Breath sounds: Normal breath sounds. No wheezing.  Abdominal:     General: Bowel sounds are normal. There is no distension.     Palpations: Abdomen is soft.     Tenderness: There is no abdominal tenderness.  Musculoskeletal:        General: Normal range of motion.     Cervical back: Normal range of motion and neck supple.  Skin:    General: Skin is warm and dry.     Findings: No erythema.  Neurological:     Mental Status: She is alert and oriented to person, place, and time. Mental status is at baseline.     Cranial Nerves: No cranial nerve deficit.     Motor: No abnormal muscle tone.     Coordination: Coordination normal.  Psychiatric:        Mood and Affect: Mood and affect normal.      LABORATORY DATA:  I  have reviewed the data as listed    Latest Ref Rng & Units 03/06/2024    8:42 AM 02/28/2024    8:44 AM 02/21/2024    9:03 AM  CBC  WBC 4.0 - 10.5 K/uL 5.7  7.0  5.1   Hemoglobin 12.0 - 15.0 g/dL 89.1  88.7  88.4   Hematocrit 36.0 - 46.0 % 33.4  35.0  34.8   Platelets 150 - 400 K/uL 212  72  138       Latest Ref Rng & Units 03/06/2024    8:42 AM 02/21/2024    9:03 AM 02/14/2024    9:04 AM  CMP  Glucose 70 - 99 mg/dL 863  867  852   BUN 8 - 23 mg/dL 12  10  8    Creatinine 0.44 - 1.00 mg/dL 9.54  9.45  9.50   Sodium 135 - 145 mmol/L 140  137  137   Potassium 3.5 - 5.1 mmol/L 4.2  3.9  3.6   Chloride 98 - 111 mmol/L 105  100  99   CO2 22 - 32 mmol/L 26  28  29    Calcium  8.9 - 10.3 mg/dL  8.7  8.6  8.7   Total Protein 6.5 - 8.1 g/dL 6.0  6.1  6.3   Total Bilirubin 0.0 - 1.2 mg/dL 0.8  1.0  1.1   Alkaline Phos 38 - 126 U/L 93  109  124   AST 15 - 41 U/L 23  17  20    ALT 0 - 44 U/L 15  11  14       RADIOGRAPHIC STUDIES: I have personally reviewed the radiological images as listed and agreed with the findings in the report. US  Venous Img Lower Bilateral Result Date: 02/10/2024 CLINICAL DATA:  Bilateral lower extremity edema since beginning chemotherapy. EXAM: BILATERAL LOWER EXTREMITY VENOUS DOPPLER ULTRASOUND TECHNIQUE: Gray-scale sonography with graded compression, as well as color Doppler and duplex ultrasound were performed to evaluate the lower extremity deep venous systems from the level of the common femoral vein and including the common femoral, femoral, profunda femoral, popliteal and calf veins including the posterior tibial, peroneal and gastrocnemius veins when visible. The superficial great saphenous vein was also interrogated. Spectral Doppler was utilized to evaluate flow at rest and with distal augmentation maneuvers in the common femoral, femoral and popliteal veins. COMPARISON:  None Available. FINDINGS: RIGHT LOWER EXTREMITY Common Femoral Vein: No evidence of thrombus. Normal compressibility, respiratory phasicity and response to augmentation. Saphenofemoral Junction: No evidence of thrombus. Normal compressibility and flow on color Doppler imaging. Profunda Femoral Vein: No evidence of thrombus. Normal compressibility and flow on color Doppler imaging. Femoral Vein: No evidence of thrombus. Normal compressibility, respiratory phasicity and response to augmentation. Popliteal Vein: No evidence of thrombus. Normal compressibility, respiratory phasicity and response to augmentation. Calf Veins: No evidence of thrombus. Normal compressibility and flow on color Doppler imaging. Superficial Great Saphenous Vein: No evidence of thrombus. Normal compressibility. Venous Reflux:   None. Other Findings:  None. LEFT LOWER EXTREMITY Common Femoral Vein: No evidence of thrombus. Normal compressibility, respiratory phasicity and response to augmentation. Saphenofemoral Junction: No evidence of thrombus. Normal compressibility and flow on color Doppler imaging. Profunda Femoral Vein: No evidence of thrombus. Normal compressibility and flow on color Doppler imaging. Femoral Vein: No evidence of thrombus. Normal compressibility, respiratory phasicity and response to augmentation. Popliteal Vein: No evidence of thrombus. Normal compressibility, respiratory phasicity and response to augmentation. Calf Veins: No evidence of thrombus. Normal compressibility  and flow on color Doppler imaging. Superficial Great Saphenous Vein: No evidence of thrombus. Normal compressibility. Venous Reflux:  None. Other Findings:  None. IMPRESSION: No evidence of deep venous thrombosis in either lower extremity. Electronically Signed   By: Wilkie Lent M.D.   On: 02/10/2024 15:33

## 2024-03-06 NOTE — Assessment & Plan Note (Signed)
 Chemotherapy induced. Monitor closely.

## 2024-03-06 NOTE — Assessment & Plan Note (Addendum)
 Bone marrow biopsy results, PET scan findings, lab results were reviewed and discussed with patient. Diagnosis of IgA kappa multiple myeloma was reviewed and discussed.  Lab Results  Component Value Date   MPROTEIN 0.4 (H) 02/14/2024   KPAFRELGTCHN 44.4 (H) 02/14/2024   LAMBDASER 7.5 02/14/2024   KAPLAMBRATIO 5.92 (H) 02/14/2024    Normal Cytogenetics and multiple myeloma FISH testing results are pending. Patient is a transplant candidate.   Labs are reviewed and discussed with patient. M protein and light chain levels have responded very well. VGPR Proceed with cycle 4  Dara RVD.  She will establish care with [facility] oncology evaluation of bone marrow transplant.  Thrombosis prophylaxis, aspirin 81 mg daily. Shingle prophylaxis: acyclovir  She has ongoing dental issue, not cleared by dentist for further zometa  treatments. She has ongoing dental work

## 2024-03-06 NOTE — Assessment & Plan Note (Signed)
 Secondary to multiple myeloma/ chemotherapy  Monitor closely.

## 2024-03-06 NOTE — Assessment & Plan Note (Signed)
 Continue Norco 5/325mg  Q4-6hour PRN.- she uses rarely.  MS Contin  15 mg twice daily to every 8 hours.

## 2024-03-06 NOTE — Progress Notes (Signed)
 Dana Wallace                                          MRN: [id number]   03/06/2024   The [facility] Quality Team Specialist reviewed this patient medical record for the purposes of chart review for care gap closure. The following were reviewed: chart review for care gap closure-kidney health evaluation for diabetes:eGFR  and uACR.    [facility] Quality Team

## 2024-03-09 ENCOUNTER — Inpatient Hospital Stay

## 2024-03-09 VITALS — BP 146/55 | HR 73 | Temp 97.6°F | Resp 18

## 2024-03-09 DIAGNOSIS — Z5111 Encounter for antineoplastic chemotherapy: Secondary | ICD-10-CM | POA: Diagnosis not present

## 2024-03-09 DIAGNOSIS — C9000 Multiple myeloma not having achieved remission: Secondary | ICD-10-CM

## 2024-03-09 DIAGNOSIS — I499 Cardiac arrhythmia, unspecified: Secondary | ICD-10-CM | POA: Insufficient documentation

## 2024-03-09 DIAGNOSIS — G893 Neoplasm related pain (acute) (chronic): Secondary | ICD-10-CM | POA: Diagnosis not present

## 2024-03-09 DIAGNOSIS — Z5112 Encounter for antineoplastic immunotherapy: Secondary | ICD-10-CM | POA: Diagnosis not present

## 2024-03-09 LAB — KAPPA/LAMBDA LIGHT CHAINS
Kappa free light chain: 18 mg/L (ref 3.3–19.4)
Kappa, lambda light chain ratio: 3.6 — ABNORMAL HIGH (ref 0.26–1.65)
Lambda free light chains: 5 mg/L — ABNORMAL LOW (ref 5.7–26.3)

## 2024-03-09 MED ORDER — DIPHENHYDRAMINE HCL 25 MG PO CAPS
50.0000 mg | ORAL_CAPSULE | Freq: Once | ORAL | Status: AC
  Administered 2024-03-09: 50 mg via ORAL
  Filled 2024-03-09: qty 2

## 2024-03-09 MED ORDER — PROCHLORPERAZINE MALEATE 10 MG PO TABS
10.0000 mg | ORAL_TABLET | Freq: Once | ORAL | Status: AC
  Administered 2024-03-09: 10 mg via ORAL
  Filled 2024-03-09: qty 1

## 2024-03-09 MED ORDER — BORTEZOMIB CHEMO SQ INJECTION 3.5 MG (2.5MG/ML)
1.3000 mg/m2 | Freq: Once | INTRAMUSCULAR | Status: AC
  Administered 2024-03-09: 2.5 mg via SUBCUTANEOUS
  Filled 2024-03-09: qty 1

## 2024-03-09 NOTE — Patient Instructions (Signed)
 [facility] - A DEPT OF [facility]  Discharge Instructions: Thank you for choosing [facility] to provide your oncology and hematology care.  If you have a lab appointment with the Cancer Center, please go directly to the Cancer Center and check in at the registration area.  Wear comfortable clothing and clothing appropriate for easy access to any Portacath or PICC line.   We strive to give you quality time with your provider. You may need to reschedule your appointment if you arrive late (15 or more minutes).  Arriving late affects you and other patients whose appointments are after yours.  Also, if you miss three or more appointments without notifying the office, you may be dismissed from the clinic at the provider's discretion.      For prescription refill requests, have your pharmacy contact our office and allow 72 hours for refills to be completed.    Today you received the following chemotherapy and/or immunotherapy agents- velcade      To help prevent nausea and vomiting after your treatment, we encourage you to take your nausea medication as directed.  BELOW ARE SYMPTOMS THAT SHOULD BE REPORTED IMMEDIATELY: *FEVER GREATER THAN 100.4 F (38 C) OR HIGHER *CHILLS OR SWEATING *NAUSEA AND VOMITING THAT IS NOT CONTROLLED WITH YOUR NAUSEA MEDICATION *UNUSUAL SHORTNESS OF BREATH *UNUSUAL BRUISING OR BLEEDING *URINARY PROBLEMS (pain or burning when urinating, or frequent urination) *BOWEL PROBLEMS (unusual diarrhea, constipation, pain near the anus) TENDERNESS IN MOUTH AND THROAT WITH OR WITHOUT PRESENCE OF ULCERS (sore throat, sores in mouth, or a toothache) UNUSUAL RASH, SWELLING OR PAIN  UNUSUAL VAGINAL DISCHARGE OR ITCHING   Items with * indicate a potential emergency and should be followed up as soon as possible or go to the Emergency Department if any problems should occur.  Please show the CHEMOTHERAPY ALERT CARD or IMMUNOTHERAPY  ALERT CARD at check-in to the Emergency Department and triage nurse.  Should you have questions after your visit or need to cancel or reschedule your appointment, please contact [facility] - A DEPT OF [facility]  [phone] and follow the prompts.  Office hours are 8:00 a.m. to 4:30 p.m. Monday - Friday. Please note that voicemails left after 4:00 p.m. may not be returned until the following business day.  We are closed weekends and major holidays. You have access to a nurse at all times for urgent questions. Please call the main number to the clinic [phone] and follow the prompts.  For any non-urgent questions, you may also contact your provider using MyChart. We now offer e-Visits for anyone 47 and older to request care online for non-urgent symptoms. For details visit [email or web address].   Also download the MyChart app! Go to the app store, search "MyChart", open the app, select [place], and log in with your MyChart username and password.

## 2024-03-09 NOTE — Progress Notes (Unsigned)
 Cardiology Office Note  Date:  03/10/2024   ID:  Dana Wallace, DOB 1955/06/30, MRN [id number]  PCP:  Myrla Jon HERO, MD   Chief Complaint  Patient presents with   New Patient (Initial Visit)    Referrred by Dr. Babara for bradycardia & a decrease in blood pressure. Patient is having chemo twice a week for multiple myeloma.      HPI:  Dana Wallace is a 68 y.o. female with past medical history of: Past Medical History:  Diagnosis Date   Anxiety    Diabetes mellitus without complication (HCC)    Hypertension   Multiple myeloma/anemia/thrombocytopenia Who presents by referral from Dr. Babara for irregular heartbeat, PVCs  Symptoms from multiple myeloma starting February 2025 Chest discomfort into her shoulder Started evaluation April 2025 for symptoms  Echocardiogram with EF 55 to 60%, normal RV size and function  found to have stage II IgA kappa multiple myeloma.   PET scan with diffuse extensive lytic bone lesions throughout axial and appendicular skeleton Pathologic fracture involving right clavicle Large soft tissue masses associated with the left second anterior rib and lower sternum  Completed bone marrow biopsy  On chemotherapy since August 2025 myeloma Chronic pain, takes MS Contin  15 mg every 8 hours  Review of EKGs details PVCs in bigeminal pattern August 2025  Low pulse during chemo, also sometimes low blood pressure using automated machine to measure  Sent to ER 01/10/24, PVCs seen No other acute findings noted  PVCs noted during echocardiogram April 2025 Normal ejection fraction at that time  PVCs again noted on EKG today in a trigeminal and Quadrigeminal pattern  For the most part asymptomatic from her PVCs  Reports after getting some low blood pressure measurements during chemo, lisinopril  was held  EKG personally reviewed by myself on todays visit EKG Interpretation Date/Time:  Tuesday March 10 2024 14:09:52 EDT Ventricular Rate:  95 PR  Interval:  130 QRS Duration:  82 QT Interval:  382 QTC Calculation: 480 R Axis:   -2  Text Interpretation: Sinus rhythm with frequent Premature ventricular complexes When compared with ECG of 10-Jan-2024 12:36, Premature atrial complexes are no longer Present Confirmed by Perla Lye [id number]) on 03/10/2024 2:12:06 PM    PMH:   has a past medical history of Anxiety, Diabetes mellitus without complication (HCC), and Hypertension.   PSH:    Past Surgical History:  Procedure Laterality Date   CESAREAN SECTION     CHOLECYSTECTOMY     IR BONE MARROW BIOPSY & ASPIRATION  12/18/2023   TUBAL LIGATION      Current Outpatient Medications  Medication Sig Dispense Refill   acyclovir  (ZOVIRAX ) 400 MG tablet Take 1 tablet (400 mg total) by mouth 2 (two) times daily. 60 tablet 3   albuterol  (VENTOLIN  HFA) 108 (90 Base) MCG/ACT inhaler Inhale 2 puffs into the lungs every 6 (six) hours as needed for wheezing or shortness of breath. 8 g 2   ALPRAZolam  (XANAX ) 0.5 MG tablet Take 0.5-1 tablets (0.25-0.5 mg total) by mouth 2 (two) times daily as needed for anxiety. 30 tablet 0   aspirin 81 MG tablet Take by mouth.     Blood Glucose Monitoring Suppl (ONE TOUCH ULTRA 2) w/Device KIT 1 each by Does not apply route daily at 2 PM. 1 kit 0   glipiZIDE  (GLUCOTROL  XL) 5 MG 24 hr tablet TAKE 1 TABLET(5 MG) BY MOUTH DAILY WITH BREAKFAST 90 tablet 1   glucose blood (ONETOUCH ULTRA) test strip 1 each  by Other route daily. Use as instructed 100 each 12   hydrochlorothiazide  (HYDRODIURIL ) 12.5 MG tablet Take 1 tablet (12.5 mg total) by mouth daily. 90 tablet 3   HYDROcodone -acetaminophen  (NORCO/VICODIN) 5-325 MG tablet Take 1-2 tablets by mouth every 4 (four) hours as needed for moderate pain (pain score 4-6). 60 tablet 0   lactulose  (CHRONULAC ) 10 GM/15ML solution Take 15 mLs (10 g total) by mouth 2 (two) times daily as needed for mild constipation. 236 mL 1   latanoprost (XALATAN) 0.005 % ophthalmic solution 1 drop  at bedtime.     lenalidomide  (REVLIMID ) 25 MG capsule Take 1 capsule by mouth once daily for 14 days on, 7 days off. 14 capsule 0   metFORMIN  (GLUCOPHAGE ) 1000 MG tablet Take 1 tablet (1,000 mg total) by mouth 2 (two) times daily with a meal. 180 tablet 3   montelukast  (SINGULAIR ) 10 MG tablet Take 1 tablet (10 mg total) by mouth daily as needed (wheezing). 10 tablet 0   morphine  (MS CONTIN ) 15 MG 12 hr tablet Take 1 tablet (15 mg total) by mouth every 8 (eight) hours. 90 tablet 0   ondansetron  (ZOFRAN ) 8 MG tablet Take 1 tablet (8 mg total) by mouth every 8 (eight) hours as needed for nausea or vomiting. 30 tablet 1   simvastatin  (ZOCOR ) 10 MG tablet TAKE 1 TABLET(10 MG) BY MOUTH DAILY AT 6 PM 90 tablet 2   gabapentin  (NEURONTIN ) 100 MG capsule Take 1 capsule (100 mg total) by mouth 2 (two) times daily. (Patient not taking: Reported on 03/10/2024) 60 capsule 0   naloxone  (NARCAN ) nasal spray 4 mg/0.1 mL SPRAY 1 SPRAY INTO ONE NOSTRIL AS DIRECTED FOR OPIOID OVERDOSE (TURN PERSON ON SIDE AFTER DOSE. IF NO RESPONSE IN 2-3 MINUTES OR PERSON RESPONDS BUT RELAPSES, REPEAT USING A NEW SPRAY DEVICE AND SPRAY INTO THE OTHER NOSTRIL. CALL 911 AFTER USE.) * EMERGENCY USE ONLY * (Patient not taking: Reported on 03/10/2024) 1 each 0   No current facility-administered medications for this visit.     Allergies:   Patient has no known allergies.   Social History:  The patient  reports that she has never smoked. She has never used smokeless tobacco. She reports that she does not drink alcohol and does not use drugs.   Family History:   family history includes COPD in her mother; Cancer in her sister; Hodgkin's lymphoma in her sister; Multiple sclerosis in her mother; Varicose Veins in her mother.    Review of Systems: Review of Systems  Constitutional: Negative.   HENT: Negative.    Respiratory: Negative.    Cardiovascular: Negative.   Gastrointestinal: Negative.   Musculoskeletal: Negative.    Neurological: Negative.   Psychiatric/Behavioral: Negative.    All other systems reviewed and are negative.    PHYSICAL EXAM: VS:  BP (!) 160/60 (BP Location: Left Arm, Patient Position: Sitting, Cuff Size: Normal)   Ht 5' (1.524 m)   Wt 179 lb 8 oz (81.4 kg)   SpO2 98%   BMI 35.06 kg/m  , BMI Body mass index is 35.06 kg/m. GEN: Well nourished, well developed, in no acute distress HEENT: normal Neck: no JVD, carotid bruits, or masses Cardiac: RRR; ectopy appreciated no murmurs, rubs, or gallops,no edema  Respiratory:  clear to auscultation bilaterally, normal work of breathing GI: soft, nontender, nondistended, + BS MS: no deformity or atrophy Skin: warm and dry, no rash Neuro:  Strength and sensation are intact Psych: euthymic mood, full affect  Recent Labs: 08/30/2023: TSH 1.790 01/10/2024: Magnesium 2.9 03/06/2024: ALT 15; BUN 12; Creatinine 0.45; Hemoglobin 10.8; Platelet Count 212; Potassium 4.2; Sodium 140    Lipid Panel Lab Results  Component Value Date   CHOL 129 08/30/2023   HDL 44 08/30/2023   LDLCALC 65 08/30/2023   TRIG 108 08/30/2023      Wt Readings from Last 3 Encounters:  03/10/24 179 lb 8 oz (81.4 kg)  03/06/24 179 lb 12.8 oz (81.6 kg)  02/24/24 176 lb 5.9 oz (80 kg)    ASSESSMENT AND PLAN:  Problem List Items Addressed This Visit       Cardiology Problems   Hypertension associated with diabetes (HCC)   Relevant Orders   EKG 12-Lead (Completed)     Other   Leg swelling (Chronic)   T2DM (type 2 diabetes mellitus) (HCC) (Chronic)   Bradycardia (Chronic)   Relevant Orders   EKG 12-Lead (Completed)   Irregular heartbeat - Primary   Relevant Orders   EKG 12-Lead (Completed)   Morbid obesity (HCC)   Chest discomfort   Relevant Orders   EKG 12-Lead (Completed)   Frequent PVCs Likely contributing to erroneous heart rate and blood pressure measurements Noted even during her echo April 2025, during visit to ER August 2025 bigeminal  pattern, on today's EKG in a trigeminal, sometimes quadrigeminal pattern - She is asymptomatic, ejection fraction is normal - Likely of little clinical concern - Frequent PVCs can lead to erroneous heart rate and blood pressure measurements with automated machinery, manual measurements would likely be more accurate - Recommend she start metoprolol  succinate 25 daily, Zio monitor ordered to look at [facility] burden - The finding of PVCs should not limit her candidacy for bone marrow transplant as she is starting workup [facility]  Multiple myeloma -Symptoms starting February 2025, found April 2025 through imaging - Is completing chemotherapy, reports symptoms improving over the past 3 months, now able to walk - Continues to have pain managed with as needed pain medication - Has been referred to [facility] for evaluation of bone marrow transplant - Acceptable risk from a cardiac perspective for transplant   Signed, Velinda Lunger, M.D., Ph.D. [facility] [place], [place] [phone]

## 2024-03-10 ENCOUNTER — Ambulatory Visit: Attending: Cardiovascular Disease | Admitting: Cardiovascular Disease

## 2024-03-10 ENCOUNTER — Encounter: Payer: Self-pay | Admitting: Cardiovascular Disease

## 2024-03-10 ENCOUNTER — Ambulatory Visit: Attending: Cardiovascular Disease

## 2024-03-10 VITALS — BP 160/60 | HR 95 | Ht 60.0 in | Wt 179.5 lb

## 2024-03-10 DIAGNOSIS — R001 Bradycardia, unspecified: Secondary | ICD-10-CM

## 2024-03-10 DIAGNOSIS — I499 Cardiac arrhythmia, unspecified: Secondary | ICD-10-CM | POA: Diagnosis not present

## 2024-03-10 DIAGNOSIS — I152 Hypertension secondary to endocrine disorders: Secondary | ICD-10-CM

## 2024-03-10 DIAGNOSIS — E1159 Type 2 diabetes mellitus with other circulatory complications: Secondary | ICD-10-CM

## 2024-03-10 DIAGNOSIS — M7989 Other specified soft tissue disorders: Secondary | ICD-10-CM | POA: Diagnosis not present

## 2024-03-10 DIAGNOSIS — E1169 Type 2 diabetes mellitus with other specified complication: Secondary | ICD-10-CM

## 2024-03-10 DIAGNOSIS — R0789 Other chest pain: Secondary | ICD-10-CM | POA: Diagnosis not present

## 2024-03-10 LAB — MULTIPLE MYELOMA PANEL, SERUM
Albumin SerPl Elph-Mcnc: 3.1 g/dL (ref 2.9–4.4)
Albumin/Glob SerPl: 1.3 (ref 0.7–1.7)
Alpha 1: 0.3 g/dL (ref 0.0–0.4)
Alpha2 Glob SerPl Elph-Mcnc: 0.7 g/dL (ref 0.4–1.0)
B-Globulin SerPl Elph-Mcnc: 1 g/dL (ref 0.7–1.3)
Gamma Glob SerPl Elph-Mcnc: 0.4 g/dL (ref 0.4–1.8)
Globulin, Total: 2.4 g/dL (ref 2.2–3.9)
IgA: 55 mg/dL — ABNORMAL LOW (ref 87–352)
IgG (Immunoglobin G), Serum: 469 mg/dL — ABNORMAL LOW (ref 586–1602)
IgM (Immunoglobulin M), Srm: 10 mg/dL — ABNORMAL LOW (ref 26–217)
M Protein SerPl Elph-Mcnc: 0.1 g/dL — ABNORMAL HIGH
Total Protein ELP: 5.5 g/dL — ABNORMAL LOW (ref 6.0–8.5)

## 2024-03-10 MED ORDER — METOPROLOL SUCCINATE ER 25 MG PO TB24: 25.0000 mg | ORAL_TABLET | Freq: Every day | ORAL | 3 refills | Status: AC

## 2024-03-10 NOTE — Patient Instructions (Addendum)
 Medication Instructions:   Please start metoprolol  succinate 25 mg daily  If you need a refill on your cardiac medications before your next appointment, please call your pharmacy.   Lab work: No new labs needed  Testing/Procedures:   Your physician has recommended that you wear a Zio monitor.   This monitor is a medical device that records the heart's electrical activity. Doctors most often use these monitors to diagnose arrhythmias. Arrhythmias are problems with the speed or rhythm of the heartbeat. The monitor is a small device applied to your chest. You can wear one while you do your normal daily activities. While wearing this monitor if you have any symptoms to push the button and record what you felt. Once you have worn this monitor for the period of time provider prescribed (Usually 14 days), you will return the monitor device in the postage paid box. Once it is returned they will download the data collected and provide us  with a report which the provider will then review and we will call you with those results. Important tips:  Avoid showering during the first 24 hours of wearing the monitor. Avoid excessive sweating to help maximize wear time. Do not submerge the device, no hot tubs, and no swimming pools. Keep any lotions or oils away from the patch. After 24 hours you may shower with the patch on. Take brief showers with your back facing the shower head.  Do not remove patch once it has been placed because that will interrupt data and decrease adhesive wear time. Push the button when you have any symptoms and write down what you were feeling. Once you have completed wearing your monitor, remove and place into box which has postage paid and place in your outgoing mailbox.  If for some reason you have misplaced your box then call our office and we can provide another box and/or mail it off for you.   Follow-Up: At [facility], you and your health needs are our priority.  As part  of our continuing mission to provide you with exceptional heart care, we have created designated Provider Care Teams.  These Care Teams include your primary Cardiologist (physician) and Advanced Practice Providers (APPs -  Physician Assistants and Nurse Practitioners) who all work together to provide you with the care you need, when you need it.  You will need a follow up appointment as needed  Providers on your designated Care Team:   Lonni Meager, NP Bernardino Bring, PA-C [place]ence [place], [place]  COVID-19 Vaccine Information can be found at: [email or web address] For questions related to vaccine distribution or appointments, please email [email or web address] or call [phone].

## 2024-03-13 ENCOUNTER — Inpatient Hospital Stay

## 2024-03-13 VITALS — BP 116/59 | HR 67 | Temp 96.5°F | Resp 18

## 2024-03-13 DIAGNOSIS — D539 Nutritional anemia, unspecified: Secondary | ICD-10-CM | POA: Diagnosis not present

## 2024-03-13 DIAGNOSIS — C9000 Multiple myeloma not having achieved remission: Secondary | ICD-10-CM | POA: Diagnosis not present

## 2024-03-13 DIAGNOSIS — Z5112 Encounter for antineoplastic immunotherapy: Secondary | ICD-10-CM | POA: Diagnosis not present

## 2024-03-13 DIAGNOSIS — Z5111 Encounter for antineoplastic chemotherapy: Secondary | ICD-10-CM | POA: Diagnosis not present

## 2024-03-13 DIAGNOSIS — G893 Neoplasm related pain (acute) (chronic): Secondary | ICD-10-CM | POA: Diagnosis not present

## 2024-03-13 LAB — CBC WITH DIFFERENTIAL (CANCER CENTER ONLY)
Abs Immature Granulocytes: 0.02 K/uL (ref 0.00–0.07)
Basophils Absolute: 0 K/uL (ref 0.0–0.1)
Basophils Relative: 1 %
Eosinophils Absolute: 1.6 K/uL — ABNORMAL HIGH (ref 0.0–0.5)
Eosinophils Relative: 24 %
HCT: 36.4 % (ref 36.0–46.0)
Hemoglobin: 11.4 g/dL — ABNORMAL LOW (ref 12.0–15.0)
Immature Granulocytes: 0 %
Lymphocytes Relative: 10 %
Lymphs Abs: 0.6 K/uL — ABNORMAL LOW (ref 0.7–4.0)
MCH: 30.5 pg (ref 26.0–34.0)
MCHC: 31.3 g/dL (ref 30.0–36.0)
MCV: 97.3 fL (ref 80.0–100.0)
Monocytes Absolute: 0.5 K/uL (ref 0.1–1.0)
Monocytes Relative: 8 %
Neutro Abs: 3.8 K/uL (ref 1.7–7.7)
Neutrophils Relative %: 57 %
Platelet Count: 130 K/uL — ABNORMAL LOW (ref 150–400)
RBC: 3.74 MIL/uL — ABNORMAL LOW (ref 3.87–5.11)
RDW: 15.4 % (ref 11.5–15.5)
WBC Count: 6.6 K/uL (ref 4.0–10.5)
nRBC: 0 % (ref 0.0–0.2)

## 2024-03-13 LAB — CMP (CANCER CENTER ONLY)
ALT: 11 U/L (ref 0–44)
AST: 14 U/L — ABNORMAL LOW (ref 15–41)
Albumin: 3.6 g/dL (ref 3.5–5.0)
Alkaline Phosphatase: 83 U/L (ref 38–126)
Anion gap: 8 (ref 5–15)
BUN: 10 mg/dL (ref 8–23)
CO2: 28 mmol/L (ref 22–32)
Calcium: 8.2 mg/dL — ABNORMAL LOW (ref 8.9–10.3)
Chloride: 101 mmol/L (ref 98–111)
Creatinine: 0.45 mg/dL (ref 0.44–1.00)
GFR, Estimated: >60 mL/min (ref >60)
Glucose, Bld: 108 mg/dL — ABNORMAL HIGH (ref 70–99)
Potassium: 3.9 mmol/L (ref 3.5–5.1)
Sodium: 137 mmol/L (ref 135–145)
Total Bilirubin: 0.6 mg/dL (ref 0.0–1.2)
Total Protein: 6 g/dL — ABNORMAL LOW (ref 6.5–8.1)

## 2024-03-13 MED ORDER — BORTEZOMIB CHEMO SQ INJECTION 3.5 MG (2.5MG/ML)
1.3000 mg/m2 | Freq: Once | INTRAMUSCULAR | Status: AC
  Administered 2024-03-13: 2.5 mg via SUBCUTANEOUS
  Filled 2024-03-13: qty 1

## 2024-03-13 MED ORDER — DEXAMETHASONE 4 MG PO TABS
20.0000 mg | ORAL_TABLET | Freq: Once | ORAL | Status: AC
  Administered 2024-03-13: 20 mg via ORAL
  Filled 2024-03-13: qty 5

## 2024-03-13 MED ORDER — DARATUMUMAB-HYALURONIDASE-FIHJ 1800-30000 MG-UT/15ML ~~LOC~~ SOLN
1800.0000 mg | Freq: Once | SUBCUTANEOUS | Status: AC
  Administered 2024-03-13: 1800 mg via SUBCUTANEOUS
  Filled 2024-03-13: qty 15

## 2024-03-13 MED ORDER — ACETAMINOPHEN 325 MG PO TABS
650.0000 mg | ORAL_TABLET | Freq: Once | ORAL | Status: AC
  Administered 2024-03-13: 650 mg via ORAL
  Filled 2024-03-13: qty 2

## 2024-03-13 MED ORDER — DIPHENHYDRAMINE HCL 25 MG PO CAPS
50.0000 mg | ORAL_CAPSULE | Freq: Once | ORAL | Status: AC
  Administered 2024-03-13: 50 mg via ORAL
  Filled 2024-03-13: qty 2

## 2024-03-13 NOTE — Patient Instructions (Signed)
 [facility] - A DEPT OF [facility]  Discharge Instructions: Thank you for choosing [facility] to provide your oncology and hematology care.  If you have a lab appointment with the Cancer Center, please go directly to the Cancer Center and check in at the registration area.  Wear comfortable clothing and clothing appropriate for easy access to any Portacath or PICC line.   We strive to give you quality time with your provider. You may need to reschedule your appointment if you arrive late (15 or more minutes).  Arriving late affects you and other patients whose appointments are after yours.  Also, if you miss three or more appointments without notifying the office, you may be dismissed from the clinic at the provider's discretion.      For prescription refill requests, have your pharmacy contact our office and allow 72 hours for refills to be completed.    Today you received the following chemotherapy and/or immunotherapy agents- darzalex faspro, velcade      To help prevent nausea and vomiting after your treatment, we encourage you to take your nausea medication as directed.  BELOW ARE SYMPTOMS THAT SHOULD BE REPORTED IMMEDIATELY: *FEVER GREATER THAN 100.4 F (38 C) OR HIGHER *CHILLS OR SWEATING *NAUSEA AND VOMITING THAT IS NOT CONTROLLED WITH YOUR NAUSEA MEDICATION *UNUSUAL SHORTNESS OF BREATH *UNUSUAL BRUISING OR BLEEDING *URINARY PROBLEMS (pain or burning when urinating, or frequent urination) *BOWEL PROBLEMS (unusual diarrhea, constipation, pain near the anus) TENDERNESS IN MOUTH AND THROAT WITH OR WITHOUT PRESENCE OF ULCERS (sore throat, sores in mouth, or a toothache) UNUSUAL RASH, SWELLING OR PAIN  UNUSUAL VAGINAL DISCHARGE OR ITCHING   Items with * indicate a potential emergency and should be followed up as soon as possible or go to the Emergency Department if any problems should occur.  Please show the CHEMOTHERAPY ALERT CARD or  IMMUNOTHERAPY ALERT CARD at check-in to the Emergency Department and triage nurse.  Should you have questions after your visit or need to cancel or reschedule your appointment, please contact [facility] - A DEPT OF [facility]  [phone] and follow the prompts.  Office hours are 8:00 a.m. to 4:30 p.m. Monday - Friday. Please note that voicemails left after 4:00 p.m. may not be returned until the following business day.  We are closed weekends and major holidays. You have access to a nurse at all times for urgent questions. Please call the main number to the clinic [phone] and follow the prompts.  For any non-urgent questions, you may also contact your provider using MyChart. We now offer e-Visits for anyone 56 and older to request care online for non-urgent symptoms. For details visit [email or web address].   Also download the MyChart app! Go to the app store, search "MyChart", open the app, select [place], and log in with your MyChart username and password.

## 2024-03-16 ENCOUNTER — Inpatient Hospital Stay

## 2024-03-16 ENCOUNTER — Telehealth: Payer: Self-pay | Admitting: Family Medicine

## 2024-03-16 VITALS — BP 121/57 | HR 70 | Resp 18

## 2024-03-16 DIAGNOSIS — C9000 Multiple myeloma not having achieved remission: Secondary | ICD-10-CM

## 2024-03-16 DIAGNOSIS — Z5111 Encounter for antineoplastic chemotherapy: Secondary | ICD-10-CM | POA: Diagnosis not present

## 2024-03-16 MED ORDER — BORTEZOMIB CHEMO SQ INJECTION 3.5 MG (2.5MG/ML)
1.3000 mg/m2 | Freq: Once | INTRAMUSCULAR | Status: AC
  Administered 2024-03-16: 2.5 mg via SUBCUTANEOUS
  Filled 2024-03-16: qty 1

## 2024-03-16 MED ORDER — DIPHENHYDRAMINE HCL 25 MG PO CAPS
50.0000 mg | ORAL_CAPSULE | Freq: Once | ORAL | Status: AC
  Administered 2024-03-16: 50 mg via ORAL
  Filled 2024-03-16: qty 2

## 2024-03-16 MED ORDER — PROCHLORPERAZINE MALEATE 10 MG PO TABS
10.0000 mg | ORAL_TABLET | Freq: Once | ORAL | Status: AC
  Administered 2024-03-16: 10 mg via ORAL
  Filled 2024-03-16: qty 1

## 2024-03-16 NOTE — Telephone Encounter (Signed)
 [facility] faxed refill request for the following medications:  One Touch Ultra Blue Testst (NEW) 100   Please advise.

## 2024-03-16 NOTE — Patient Instructions (Signed)
 [facility] - A DEPT OF [facility]  Discharge Instructions: Thank you for choosing [facility] to provide your oncology and hematology care.  If you have a lab appointment with the Cancer Center, please go directly to the Cancer Center and check in at the registration area.  Wear comfortable clothing and clothing appropriate for easy access to any Portacath or PICC line.   We strive to give you quality time with your provider. You may need to reschedule your appointment if you arrive late (15 or more minutes).  Arriving late affects you and other patients whose appointments are after yours.  Also, if you miss three or more appointments without notifying the office, you may be dismissed from the clinic at the provider's discretion.      For prescription refill requests, have your pharmacy contact our office and allow 72 hours for refills to be completed.    Today you received the following chemotherapy and/or immunotherapy agents- velcade      To help prevent nausea and vomiting after your treatment, we encourage you to take your nausea medication as directed.  BELOW ARE SYMPTOMS THAT SHOULD BE REPORTED IMMEDIATELY: *FEVER GREATER THAN 100.4 F (38 C) OR HIGHER *CHILLS OR SWEATING *NAUSEA AND VOMITING THAT IS NOT CONTROLLED WITH YOUR NAUSEA MEDICATION *UNUSUAL SHORTNESS OF BREATH *UNUSUAL BRUISING OR BLEEDING *URINARY PROBLEMS (pain or burning when urinating, or frequent urination) *BOWEL PROBLEMS (unusual diarrhea, constipation, pain near the anus) TENDERNESS IN MOUTH AND THROAT WITH OR WITHOUT PRESENCE OF ULCERS (sore throat, sores in mouth, or a toothache) UNUSUAL RASH, SWELLING OR PAIN  UNUSUAL VAGINAL DISCHARGE OR ITCHING   Items with * indicate a potential emergency and should be followed up as soon as possible or go to the Emergency Department if any problems should occur.  Please show the CHEMOTHERAPY ALERT CARD or IMMUNOTHERAPY  ALERT CARD at check-in to the Emergency Department and triage nurse.  Should you have questions after your visit or need to cancel or reschedule your appointment, please contact [facility] - A DEPT OF [facility]  [phone] and follow the prompts.  Office hours are 8:00 a.m. to 4:30 p.m. Monday - Friday. Please note that voicemails left after 4:00 p.m. may not be returned until the following business day.  We are closed weekends and major holidays. You have access to a nurse at all times for urgent questions. Please call the main number to the clinic [phone] and follow the prompts.  For any non-urgent questions, you may also contact your provider using MyChart. We now offer e-Visits for anyone 47 and older to request care online for non-urgent symptoms. For details visit [email or web address].   Also download the MyChart app! Go to the app store, search "MyChart", open the app, select [place], and log in with your MyChart username and password.

## 2024-03-17 MED ORDER — BLOOD GLUCOSE TEST VI STRP: 1.0000 | ORAL_STRIP | 0 refills | Status: DC

## 2024-03-17 NOTE — Telephone Encounter (Signed)
 Rx sent.

## 2024-03-19 ENCOUNTER — Other Ambulatory Visit: Payer: Self-pay

## 2024-03-19 DIAGNOSIS — C9000 Multiple myeloma not having achieved remission: Secondary | ICD-10-CM

## 2024-03-19 MED ORDER — LENALIDOMIDE 25 MG PO CAPS: ORAL_CAPSULE | ORAL | 0 refills | Status: DC

## 2024-03-20 ENCOUNTER — Inpatient Hospital Stay

## 2024-03-20 ENCOUNTER — Other Ambulatory Visit: Payer: Self-pay | Admitting: Oncology

## 2024-03-20 ENCOUNTER — Encounter: Payer: Self-pay | Admitting: Oncology

## 2024-03-20 DIAGNOSIS — G893 Neoplasm related pain (acute) (chronic): Secondary | ICD-10-CM

## 2024-03-20 DIAGNOSIS — C9000 Multiple myeloma not having achieved remission: Secondary | ICD-10-CM

## 2024-03-20 DIAGNOSIS — M898X9 Other specified disorders of bone, unspecified site: Secondary | ICD-10-CM

## 2024-03-20 DIAGNOSIS — C7951 Secondary malignant neoplasm of bone: Secondary | ICD-10-CM

## 2024-03-20 DIAGNOSIS — Z5111 Encounter for antineoplastic chemotherapy: Secondary | ICD-10-CM | POA: Diagnosis not present

## 2024-03-20 DIAGNOSIS — D539 Nutritional anemia, unspecified: Secondary | ICD-10-CM

## 2024-03-20 LAB — CBC WITH DIFFERENTIAL (CANCER CENTER ONLY)
Abs Immature Granulocytes: 0.02 K/uL (ref 0.00–0.07)
Basophils Absolute: 0 K/uL (ref 0.0–0.1)
Basophils Relative: 0 %
Eosinophils Absolute: 0.6 K/uL — ABNORMAL HIGH (ref 0.0–0.5)
Eosinophils Relative: 10 %
HCT: 35.7 % — ABNORMAL LOW (ref 36.0–46.0)
Hemoglobin: 11.5 g/dL — ABNORMAL LOW (ref 12.0–15.0)
Immature Granulocytes: 0 %
Lymphocytes Relative: 10 %
Lymphs Abs: 0.6 K/uL — ABNORMAL LOW (ref 0.7–4.0)
MCH: 30 pg (ref 26.0–34.0)
MCHC: 32.2 g/dL (ref 30.0–36.0)
MCV: 93.2 fL (ref 80.0–100.0)
Monocytes Absolute: 1 K/uL (ref 0.1–1.0)
Monocytes Relative: 17 %
Neutro Abs: 3.7 K/uL (ref 1.7–7.7)
Neutrophils Relative %: 63 %
Platelet Count: 37 K/uL — ABNORMAL LOW (ref 150–400)
RBC: 3.83 MIL/uL — ABNORMAL LOW (ref 3.87–5.11)
RDW: 15.1 % (ref 11.5–15.5)
WBC Count: 5.9 K/uL (ref 4.0–10.5)
nRBC: 0 % (ref 0.0–0.2)

## 2024-03-20 MED ORDER — HYDROCODONE-ACETAMINOPHEN 5-325 MG PO TABS: 1.0000 | ORAL_TABLET | Freq: Four times a day (QID) | ORAL | 0 refills | Status: DC | PRN

## 2024-03-20 NOTE — Telephone Encounter (Signed)
 Encounter opened in error.

## 2024-03-20 NOTE — Progress Notes (Unsigned)
 Plt count 37. No treatment today per Dr. Babara.

## 2024-03-24 DIAGNOSIS — Z01818 Encounter for other preprocedural examination: Secondary | ICD-10-CM | POA: Diagnosis not present

## 2024-03-24 DIAGNOSIS — C9000 Multiple myeloma not having achieved remission: Secondary | ICD-10-CM | POA: Diagnosis not present

## 2024-03-27 ENCOUNTER — Inpatient Hospital Stay

## 2024-03-27 ENCOUNTER — Other Ambulatory Visit: Payer: Self-pay | Admitting: Oncology

## 2024-03-27 ENCOUNTER — Inpatient Hospital Stay: Attending: Oncology

## 2024-03-27 ENCOUNTER — Inpatient Hospital Stay: Admitting: Oncology

## 2024-03-27 VITALS — BP 113/57 | HR 65 | Temp 96.7°F | Resp 19 | Wt 180.1 lb

## 2024-03-27 DIAGNOSIS — Z5111 Encounter for antineoplastic chemotherapy: Secondary | ICD-10-CM | POA: Diagnosis not present

## 2024-03-27 DIAGNOSIS — E876 Hypokalemia: Secondary | ICD-10-CM | POA: Diagnosis not present

## 2024-03-27 DIAGNOSIS — D539 Nutritional anemia, unspecified: Secondary | ICD-10-CM | POA: Diagnosis not present

## 2024-03-27 DIAGNOSIS — D696 Thrombocytopenia, unspecified: Secondary | ICD-10-CM | POA: Insufficient documentation

## 2024-03-27 DIAGNOSIS — G893 Neoplasm related pain (acute) (chronic): Secondary | ICD-10-CM | POA: Insufficient documentation

## 2024-03-27 DIAGNOSIS — C9000 Multiple myeloma not having achieved remission: Secondary | ICD-10-CM

## 2024-03-27 DIAGNOSIS — Z5112 Encounter for antineoplastic immunotherapy: Secondary | ICD-10-CM | POA: Diagnosis not present

## 2024-03-27 LAB — CBC WITH DIFFERENTIAL (CANCER CENTER ONLY)
Abs Immature Granulocytes: 0.04 K/uL (ref 0.00–0.07)
Basophils Absolute: 0.1 K/uL (ref 0.0–0.1)
Basophils Relative: 1 %
Eosinophils Absolute: 0.1 K/uL (ref 0.0–0.5)
Eosinophils Relative: 2 %
HCT: 34.8 % — ABNORMAL LOW (ref 36.0–46.0)
Hemoglobin: 11.4 g/dL — ABNORMAL LOW (ref 12.0–15.0)
Immature Granulocytes: 1 %
Lymphocytes Relative: 6 %
Lymphs Abs: 0.5 K/uL — ABNORMAL LOW (ref 0.7–4.0)
MCH: 30.6 pg (ref 26.0–34.0)
MCHC: 32.8 g/dL (ref 30.0–36.0)
MCV: 93.5 fL (ref 80.0–100.0)
Monocytes Absolute: 1.3 K/uL — ABNORMAL HIGH (ref 0.1–1.0)
Monocytes Relative: 15 %
Neutro Abs: 6.4 K/uL (ref 1.7–7.7)
Neutrophils Relative %: 75 %
Platelet Count: 238 K/uL (ref 150–400)
RBC: 3.72 MIL/uL — ABNORMAL LOW (ref 3.87–5.11)
RDW: 16.1 % — ABNORMAL HIGH (ref 11.5–15.5)
WBC Count: 8.5 K/uL (ref 4.0–10.5)
nRBC: 0 % (ref 0.0–0.2)

## 2024-03-27 MED ORDER — DEXAMETHASONE 4 MG PO TABS
20.0000 mg | ORAL_TABLET | Freq: Once | ORAL | Status: AC
  Administered 2024-03-27: 20 mg via ORAL
  Filled 2024-03-27: qty 5

## 2024-03-27 MED ORDER — DARATUMUMAB-HYALURONIDASE-FIHJ 1800-30000 MG-UT/15ML ~~LOC~~ SOLN
1800.0000 mg | Freq: Once | SUBCUTANEOUS | Status: AC
  Administered 2024-03-27: 1800 mg via SUBCUTANEOUS
  Filled 2024-03-27: qty 15

## 2024-03-27 MED ORDER — DIPHENHYDRAMINE HCL 25 MG PO CAPS
50.0000 mg | ORAL_CAPSULE | Freq: Once | ORAL | Status: AC
  Administered 2024-03-27: 50 mg via ORAL
  Filled 2024-03-27: qty 2

## 2024-03-27 MED ORDER — ACETAMINOPHEN 325 MG PO TABS
650.0000 mg | ORAL_TABLET | Freq: Once | ORAL | Status: AC
  Administered 2024-03-27: 650 mg via ORAL
  Filled 2024-03-27: qty 2

## 2024-03-30 ENCOUNTER — Inpatient Hospital Stay

## 2024-03-30 DIAGNOSIS — I499 Cardiac arrhythmia, unspecified: Secondary | ICD-10-CM | POA: Diagnosis not present

## 2024-04-03 ENCOUNTER — Inpatient Hospital Stay

## 2024-04-03 ENCOUNTER — Inpatient Hospital Stay: Admitting: Oncology

## 2024-04-03 ENCOUNTER — Encounter: Payer: Self-pay | Admitting: Oncology

## 2024-04-03 VITALS — BP 130/49 | HR 58 | Temp 97.1°F | Resp 18 | Wt 173.3 lb

## 2024-04-03 VITALS — BP 116/56 | HR 58 | Resp 18

## 2024-04-03 DIAGNOSIS — C9000 Multiple myeloma not having achieved remission: Secondary | ICD-10-CM

## 2024-04-03 DIAGNOSIS — Z5111 Encounter for antineoplastic chemotherapy: Secondary | ICD-10-CM | POA: Diagnosis not present

## 2024-04-03 DIAGNOSIS — D539 Nutritional anemia, unspecified: Secondary | ICD-10-CM

## 2024-04-03 DIAGNOSIS — Z5112 Encounter for antineoplastic immunotherapy: Secondary | ICD-10-CM | POA: Diagnosis not present

## 2024-04-03 DIAGNOSIS — G893 Neoplasm related pain (acute) (chronic): Secondary | ICD-10-CM

## 2024-04-03 DIAGNOSIS — E876 Hypokalemia: Secondary | ICD-10-CM

## 2024-04-03 DIAGNOSIS — D696 Thrombocytopenia, unspecified: Secondary | ICD-10-CM

## 2024-04-03 LAB — CMP (CANCER CENTER ONLY)
ALT: 9 U/L (ref 0–44)
AST: 15 U/L (ref 15–41)
Albumin: 3.8 g/dL (ref 3.5–5.0)
Alkaline Phosphatase: 66 U/L (ref 38–126)
Anion gap: 11 (ref 5–15)
BUN: 7 mg/dL — ABNORMAL LOW (ref 8–23)
CO2: 30 mmol/L (ref 22–32)
Calcium: 9 mg/dL (ref 8.9–10.3)
Chloride: 98 mmol/L (ref 98–111)
Creatinine: 0.64 mg/dL (ref 0.44–1.00)
GFR, Estimated: >60 mL/min (ref >60)
Glucose, Bld: 129 mg/dL — ABNORMAL HIGH (ref 70–99)
Potassium: 3.2 mmol/L — ABNORMAL LOW (ref 3.5–5.1)
Sodium: 139 mmol/L (ref 135–145)
Total Bilirubin: 0.7 mg/dL (ref 0.0–1.2)
Total Protein: 6.3 g/dL — ABNORMAL LOW (ref 6.5–8.1)

## 2024-04-03 LAB — CBC WITH DIFFERENTIAL (CANCER CENTER ONLY)
Abs Immature Granulocytes: 0.01 K/uL (ref 0.00–0.07)
Basophils Absolute: 0.1 K/uL (ref 0.0–0.1)
Basophils Relative: 1 %
Eosinophils Absolute: 0.7 K/uL — ABNORMAL HIGH (ref 0.0–0.5)
Eosinophils Relative: 14 %
HCT: 37 % (ref 36.0–46.0)
Hemoglobin: 12 g/dL (ref 12.0–15.0)
Immature Granulocytes: 0 %
Lymphocytes Relative: 10 %
Lymphs Abs: 0.5 K/uL — ABNORMAL LOW (ref 0.7–4.0)
MCH: 29.9 pg (ref 26.0–34.0)
MCHC: 32.4 g/dL (ref 30.0–36.0)
MCV: 92 fL (ref 80.0–100.0)
Monocytes Absolute: 0.3 K/uL (ref 0.1–1.0)
Monocytes Relative: 7 %
Neutro Abs: 3.1 K/uL (ref 1.7–7.7)
Neutrophils Relative %: 68 %
Platelet Count: 209 K/uL (ref 150–400)
RBC: 4.02 MIL/uL (ref 3.87–5.11)
RDW: 15.6 % — ABNORMAL HIGH (ref 11.5–15.5)
WBC Count: 4.7 K/uL (ref 4.0–10.5)
nRBC: 0 % (ref 0.0–0.2)

## 2024-04-03 MED ORDER — DARATUMUMAB-HYALURONIDASE-FIHJ 1800-30000 MG-UT/15ML ~~LOC~~ SOLN
1800.0000 mg | Freq: Once | SUBCUTANEOUS | Status: AC
  Administered 2024-04-03: 1800 mg via SUBCUTANEOUS
  Filled 2024-04-03: qty 15

## 2024-04-03 MED ORDER — DEXAMETHASONE 4 MG PO TABS
20.0000 mg | ORAL_TABLET | Freq: Once | ORAL | Status: AC
  Administered 2024-04-03: 20 mg via ORAL
  Filled 2024-04-03: qty 5

## 2024-04-03 MED ORDER — POTASSIUM CHLORIDE CRYS ER 20 MEQ PO TBCR: 20.0000 meq | EXTENDED_RELEASE_TABLET | Freq: Every day | ORAL | 0 refills | Status: DC

## 2024-04-03 MED ORDER — ACETAMINOPHEN 325 MG PO TABS
650.0000 mg | ORAL_TABLET | Freq: Once | ORAL | Status: AC
  Administered 2024-04-03: 650 mg via ORAL
  Filled 2024-04-03: qty 2

## 2024-04-03 MED ORDER — BORTEZOMIB CHEMO SQ INJECTION 3.5 MG (2.5MG/ML)
1.3000 mg/m2 | Freq: Once | INTRAMUSCULAR | Status: AC
  Administered 2024-04-03: 2.5 mg via SUBCUTANEOUS
  Filled 2024-04-03: qty 1

## 2024-04-03 MED ORDER — DIPHENHYDRAMINE HCL 25 MG PO CAPS
50.0000 mg | ORAL_CAPSULE | Freq: Once | ORAL | Status: AC
  Administered 2024-04-03: 50 mg via ORAL
  Filled 2024-04-03: qty 2

## 2024-04-03 NOTE — Patient Instructions (Signed)
 [facility] - A DEPT OF [facility]  Discharge Instructions: Thank you for choosing [facility] to provide your oncology and hematology care.  If you have a lab appointment with the Cancer Center, please go directly to the Cancer Center and check in at the registration area.  Wear comfortable clothing and clothing appropriate for easy access to any Portacath or PICC line.   We strive to give you quality time with your provider. You may need to reschedule your appointment if you arrive late (15 or more minutes).  Arriving late affects you and other patients whose appointments are after yours.  Also, if you miss three or more appointments without notifying the office, you may be dismissed from the clinic at the provider's discretion.      For prescription refill requests, have your pharmacy contact our office and allow 72 hours for refills to be completed.    Today you received the following chemotherapy and/or immunotherapy agents Velcade , Darzalex       To help prevent nausea and vomiting after your treatment, we encourage you to take your nausea medication as directed.  BELOW ARE SYMPTOMS THAT SHOULD BE REPORTED IMMEDIATELY: *FEVER GREATER THAN 100.4 F (38 C) OR HIGHER *CHILLS OR SWEATING *NAUSEA AND VOMITING THAT IS NOT CONTROLLED WITH YOUR NAUSEA MEDICATION *UNUSUAL SHORTNESS OF BREATH *UNUSUAL BRUISING OR BLEEDING *URINARY PROBLEMS (pain or burning when urinating, or frequent urination) *BOWEL PROBLEMS (unusual diarrhea, constipation, pain near the anus) TENDERNESS IN MOUTH AND THROAT WITH OR WITHOUT PRESENCE OF ULCERS (sore throat, sores in mouth, or a toothache) UNUSUAL RASH, SWELLING OR PAIN  UNUSUAL VAGINAL DISCHARGE OR ITCHING   Items with * indicate a potential emergency and should be followed up as soon as possible or go to the Emergency Department if any problems should occur.  Please show the CHEMOTHERAPY ALERT CARD or  IMMUNOTHERAPY ALERT CARD at check-in to the Emergency Department and triage nurse.  Should you have questions after your visit or need to cancel or reschedule your appointment, please contact [facility] - A DEPT OF [facility]  [phone] and follow the prompts.  Office hours are 8:00 a.m. to 4:30 p.m. Monday - Friday. Please note that voicemails left after 4:00 p.m. may not be returned until the following business day.  We are closed weekends and major holidays. You have access to a nurse at all times for urgent questions. Please call the main number to the clinic [phone] and follow the prompts.  For any non-urgent questions, you may also contact your provider using MyChart. We now offer e-Visits for anyone 21 and older to request care online for non-urgent symptoms. For details visit [email or web address].   Also download the MyChart app! Go to the app store, search MyChart, open the app, select [facility], and log in with your MyChart username and password.

## 2024-04-03 NOTE — Assessment & Plan Note (Signed)
 Continue Norco 5/325mg  Q4-6hour PRN.- she uses rarely.  MS Contin  15 mg twice daily to every 8 hours.

## 2024-04-03 NOTE — Progress Notes (Signed)
 Hematology/Oncology Progress note [phone] [phone]       CHIEF COMPLAINTS/PURPOSE OF CONSULTATION:  Multiple myeloma  ASSESSMENT & PLAN:   Multiple myeloma (HCC) Bone marrow biopsy results, PET scan findings, lab results were reviewed and discussed with patient. Diagnosis of IgA kappa multiple myeloma was reviewed and discussed.  Lab Results  Component Value Date   MPROTEIN 0.1 (H) 03/06/2024   KPAFRELGTCHN 18.0 03/06/2024   LAMBDASER 5.0 (L) 03/06/2024   KAPLAMBRATIO 3.60 (H) 03/06/2024    Normal Cytogenetics and multiple myeloma FISH testing results are pending. Patient is a transplant candidate.   Labs are reviewed and discussed with patient. M protein and light chain levels have responded very well. VGPR Proceed with cycle 5  Dara RVD.  Discuss with [facility] - she may get bone marrow transplant after 5th or 6th chemotherapy.   Thrombosis prophylaxis, aspirin 81 mg daily. Shingle prophylaxis: acyclovir  She has ongoing dental work, not cleared by dentist for further zometa  treatments.   Hypokalemia Recommend potassium chloride 20meq daily.   Macrocytic anemia Secondary to multiple myeloma/ chemotherapy  Monitor closely.  Neoplasm related pain Continue Norco 5/325mg  Q4-6hour PRN.- she uses rarely.  MS Contin  15 mg twice daily to every 8 hours.   Thrombocytopenia Chemotherapy induced. Monitor closely   Orders Placed This Encounter  Procedures   Multiple Myeloma Panel (SPEP&IFE w/QIG)    Standing Status:   Future    Expected Date:   04/24/2024    Expiration Date:   04/24/2025   Kappa/lambda light chains    Standing Status:   Future    Expected Date:   04/24/2024    Expiration Date:   04/24/2025   Kappa/lambda light chains    Standing Status:   Future    Expected Date:   04/24/2024    Expiration Date:   04/24/2025   Multiple Myeloma Panel (SPEP&IFE w/QIG)    Standing Status:   Future    Expected Date:   04/24/2024    Expiration  Date:   04/24/2025   CBC with Differential (Cancer Center Only)    Standing Status:   Future    Expected Date:   04/24/2024    Expiration Date:   04/24/2025   CMP (Cancer Center only)    Standing Status:   Future    Expected Date:   04/24/2024    Expiration Date:   04/24/2025   CBC with Differential (Cancer Center Only)    Standing Status:   Future    Expected Date:   05/01/2024    Expiration Date:   05/01/2025   CMP (Cancer Center only)    Standing Status:   Future    Expected Date:   05/01/2024    Expiration Date:   05/01/2025   Follow-up per LOS All questions were answered. The patient knows to call the clinic with any problems, questions or concerns.  Zelphia Cap, MD, PhD [facility] Hematology Oncology 04/03/2024    HISTORY OF PRESENTING ILLNESS:  Dana Wallace 68 y.o. female presents for follow up of multiple myeloma   Oncology History  Multiple myeloma (HCC)  12/09/2023 Initial Diagnosis   Multiple myeloma (HCC)  She initially experienced chest discomfort starting in her left shoulder in February, which persisted and led her to seek medical attention in April. Despite a comprehensive workup including a chest x-ray, EKG, echocardiogram, mammogram, and a DEXA scan, all results were normal.  In May, she had two follow-up visits with her primary care physician. The  discomfort was primarily located behind her left breast and later moved to her right clavicle and sternum area, becoming very painful and tender to touch. An x-ray at [facility] was performed, and the patient was told there was some type of lesion, after which she underwent an MRI of the right clavicle, shoulder, and scapula. Interval marrow biopsy, lab workup and PET scan. She was found to have stage II IgA kappa multiple myeloma.   12/15/2023 Imaging   PET scan showed  1. Diffuse and extensive lytic bone lesions throughout the axial and appendicular skeleton most likely reflecting multiple myeloma. 2.  Pathologic fracture involving the right clavicle. 3. Large soft tissue masses associated with the left second anterior rib and the lower sternum. 4. No findings for a primary neoplastic process involving the neck, chest, abdomen or pelvis   12/18/2023 Bone Marrow Biopsy   Pulmonary biopsy aspirate, clot, core Showed hypercellular bone marrow, involved by kappa restricted plasma cell neoplasm compromising 30-100%, average 70%, of the cellular marrow.   12/25/2023 Cancer Staging   Staging form: Plasma Cell Myeloma and Plasma Cell Disorders, AJCC 8th Edition - Clinical stage from 12/25/2023: Beta-2 -microglobulin (mg/L): 3.9, Albumin (g/dL): 3.2, ISS: Stage II, LDH: Normal - Signed by Babara Call, MD on 12/25/2023 Stage prefix: Initial diagnosis Beta 2 microglobulin range (mg/L): 3.5 to 5.49 Albumin range (g/dL): Less than 3.5   1/84/7974 -  Chemotherapy   Patient is on Treatment Plan : MYELOMA NEWLY DIAGNOSED TRANSPLANT CANDIDATE DaraVRd (Daratumumab  SQ) q21d x 6 Cycles (Induction/Consolidation)       Pain of upper chest and back, right shoulder, and the left hip have improved. She rarely takes Norco as needed. She takes MS Contin  15 mg Q8h    Accompanied by husband. Established with [facility] Dr. Rayfield. There is plan of bone marrow transplant after 5th or 6th cycles of treatment.  She reports feeling well.  She has no new complaints.  MEDICAL HISTORY:  Past Medical History:  Diagnosis Date   Anxiety    Diabetes mellitus without complication (HCC)    Hypertension     SURGICAL HISTORY: Past Surgical History:  Procedure Laterality Date   CESAREAN SECTION     CHOLECYSTECTOMY     IR BONE MARROW BIOPSY & ASPIRATION  12/18/2023   TUBAL LIGATION      SOCIAL HISTORY: Social History   Socioeconomic History   Marital status: Married    Spouse name: Not on file   Number of children: Not on file   Years of education: Not on file   Highest education level: Bachelor's degree (e.g., BA, AB,  BS)  Occupational History   Not on file  Tobacco Use   Smoking status: Never   Smokeless tobacco: Never  Vaping Use   Vaping status: Never Used  Substance and Sexual Activity   Alcohol use: No   Drug use: Never   Sexual activity: Not Currently    Birth control/protection: None  Other Topics Concern   Not on file  Social History Narrative   Not on file   Social Drivers of Health   Financial Resource Strain: Low Risk  (01/19/2024)   Overall Financial Resource Strain (CARDIA)    Difficulty of Paying Living Expenses: Not hard at all  Food Insecurity: No Food Insecurity (01/19/2024)   Hunger Vital Sign    Worried About Running Out of Food in the Last Year: Never true    Ran Out of Food in the Last Year: Never true  Transportation  Needs: No Transportation Needs (01/19/2024)   PRAPARE - Administrator, Civil Service (Medical): No    Lack of Transportation (Non-Medical): No  Physical Activity: Inactive (01/19/2024)   Exercise Vital Sign    Days of Exercise per Week: 0 days    Minutes of Exercise per Session: Not on file  Stress: Stress Concern Present (01/19/2024)   [employer] of Occupational Health - Occupational Stress Questionnaire    Feeling of Stress: To some extent  Social Connections: Moderately Isolated (01/19/2024)   Social Connection and Isolation Panel    Frequency of Communication with Friends and Family: More than three times a week    Frequency of Social Gatherings with Friends and Family: Once a week    Attends Religious Services: Patient declined    Database Administrator or Organizations: No    Attends Engineer, Structural: Not on file    Marital Status: Married  Catering Manager Violence: Not At Risk (12/09/2023)   Humiliation, Afraid, Rape, and Kick questionnaire    Fear of Current or Ex-Partner: No    Emotionally Abused: No    Physically Abused: No    Sexually Abused: No    FAMILY HISTORY: Family History  Problem Relation Age of  Onset   COPD Mother    Multiple sclerosis Mother    Varicose Veins Mother    Hodgkin's lymphoma Sister    Cancer Sister    Breast cancer Neg Hx     ALLERGIES:  has no known allergies.  MEDICATIONS:  Current Outpatient Medications  Medication Sig Dispense Refill   acyclovir  (ZOVIRAX ) 400 MG tablet Take 1 tablet (400 mg total) by mouth 2 (two) times daily. 60 tablet 3   albuterol  (VENTOLIN  HFA) 108 (90 Base) MCG/ACT inhaler Inhale 2 puffs into the lungs every 6 (six) hours as needed for wheezing or shortness of breath. 8 g 2   ALPRAZolam  (XANAX ) 0.5 MG tablet Take 0.5-1 tablets (0.25-0.5 mg total) by mouth 2 (two) times daily as needed for anxiety. 30 tablet 0   aspirin 81 MG tablet Take by mouth.     Blood Glucose Monitoring Suppl (ONE TOUCH ULTRA 2) w/Device KIT 1 each by Does not apply route daily at 2 PM. 1 kit 0   glipiZIDE  (GLUCOTROL  XL) 5 MG 24 hr tablet TAKE 1 TABLET(5 MG) BY MOUTH DAILY WITH BREAKFAST 90 tablet 1   Glucose Blood (BLOOD GLUCOSE TEST STRIPS) STRP 1 each by Does not apply route as directed. Dispense based on patient and insurance preference. Use up to four times daily as directed. (FOR ICD-10 E10.9, E11.9). 100 strip 0   glucose blood (ONETOUCH ULTRA) test strip 1 each by Other route daily. Use as instructed 100 each 12   hydrochlorothiazide  (HYDRODIURIL ) 12.5 MG tablet Take 1 tablet (12.5 mg total) by mouth daily. 90 tablet 3   HYDROcodone -acetaminophen  (NORCO/VICODIN) 5-325 MG tablet Take 1 tablet by mouth every 6 (six) hours as needed for moderate pain (pain score 4-6). 60 tablet 0   lactulose  (CHRONULAC ) 10 GM/15ML solution Take 15 mLs (10 g total) by mouth 2 (two) times daily as needed for mild constipation. 236 mL 1   latanoprost (XALATAN) 0.005 % ophthalmic solution 1 drop at bedtime.     lenalidomide  (REVLIMID ) 25 MG capsule Take 1 capsule by mouth once daily for 14 days on, 7 days off. 14 capsule 0   metFORMIN  (GLUCOPHAGE ) 1000 MG tablet Take 1 tablet (1,000  mg total) by mouth 2 (two)  times daily with a meal. 180 tablet 3   metoprolol  succinate (TOPROL -XL) 25 MG 24 hr tablet Take 1 tablet (25 mg total) by mouth daily. Take with or immediately following a meal. 90 tablet 3   montelukast  (SINGULAIR ) 10 MG tablet Take 1 tablet (10 mg total) by mouth daily as needed (wheezing). 10 tablet 0   morphine  (MS CONTIN ) 15 MG 12 hr tablet Take 1 tablet (15 mg total) by mouth every 8 (eight) hours. 90 tablet 0   ondansetron  (ZOFRAN ) 8 MG tablet Take 1 tablet (8 mg total) by mouth every 8 (eight) hours as needed for nausea or vomiting. 30 tablet 1   potassium chloride SA (KLOR-CON M) 20 MEQ tablet Take 1 tablet (20 mEq total) by mouth daily. 7 tablet 0   simvastatin  (ZOCOR ) 10 MG tablet TAKE 1 TABLET(10 MG) BY MOUTH DAILY AT 6 PM 90 tablet 2   gabapentin  (NEURONTIN ) 100 MG capsule Take 1 capsule (100 mg total) by mouth 2 (two) times daily. (Patient not taking: Reported on 04/03/2024) 60 capsule 0   naloxone  (NARCAN ) nasal spray 4 mg/0.1 mL SPRAY 1 SPRAY INTO ONE NOSTRIL AS DIRECTED FOR OPIOID OVERDOSE (TURN PERSON ON SIDE AFTER DOSE. IF NO RESPONSE IN 2-3 MINUTES OR PERSON RESPONDS BUT RELAPSES, REPEAT USING A NEW SPRAY DEVICE AND SPRAY INTO THE OTHER NOSTRIL. CALL 911 AFTER USE.) * EMERGENCY USE ONLY * (Patient not taking: Reported on 04/03/2024) 1 each 0   No current facility-administered medications for this visit.    Review of Systems  Constitutional:  Positive for fatigue. Negative for appetite change, chills and fever.  HENT:   Negative for hearing loss and voice change.   Eyes:  Negative for eye problems.  Respiratory:  Negative for chest tightness and cough.   Cardiovascular:  Negative for chest pain.  Gastrointestinal:  Negative for abdominal distention, abdominal pain and blood in stool.  Endocrine: Negative for hot flashes.  Genitourinary:  Negative for difficulty urinating and frequency.   Musculoskeletal:  Positive for arthralgias.       Shoulder,  upper chest, hip pain improved.   Skin:  Negative for itching and rash.  Neurological:  Negative for extremity weakness.  Hematological:  Negative for adenopathy.  Psychiatric/Behavioral:  Negative for confusion.      PHYSICAL EXAMINATION: ECOG PERFORMANCE STATUS: 1 - Symptomatic but completely ambulatory  Vitals:   04/03/24 0834  BP: (!) 130/49  Pulse: (!) 58  Resp: 18  Temp: (!) 97.1 F (36.2 C)  SpO2: 98%   Filed Weights   04/03/24 0834  Weight: 173 lb 4.8 oz (78.6 kg)    Physical Exam Constitutional:      General: She is not in acute distress.    Appearance: She is not diaphoretic.  HENT:     Head: Normocephalic and atraumatic.  Eyes:     General: No scleral icterus. Cardiovascular:     Rate and Rhythm: Normal rate. Rhythm irregular.     Heart sounds: No murmur heard. Pulmonary:     Effort: Pulmonary effort is normal. No respiratory distress.     Breath sounds: Normal breath sounds. No wheezing.  Abdominal:     General: Bowel sounds are normal. There is no distension.     Palpations: Abdomen is soft.     Tenderness: There is no abdominal tenderness.  Musculoskeletal:        General: Normal range of motion.     Cervical back: Normal range of motion and neck supple.  Skin:    General: Skin is warm and dry.     Findings: No erythema.  Neurological:     Mental Status: She is alert and oriented to person, place, and time. Mental status is at baseline.     Cranial Nerves: No cranial nerve deficit.     Motor: No abnormal muscle tone.     Coordination: Coordination normal.  Psychiatric:        Mood and Affect: Mood and affect normal.      LABORATORY DATA:  I have reviewed the data as listed    Latest Ref Rng & Units 04/03/2024    8:23 AM 03/27/2024    8:30 AM 03/20/2024    9:10 AM  CBC  WBC 4.0 - 10.5 K/uL 4.7  8.5  5.9   Hemoglobin 12.0 - 15.0 g/dL 87.9  88.5  88.4   Hematocrit 36.0 - 46.0 % 37.0  34.8  35.7   Platelets 150 - 400 K/uL 209  238  37        Latest Ref Rng & Units 04/03/2024    8:23 AM 03/13/2024    9:01 AM 03/06/2024    8:42 AM  CMP  Glucose 70 - 99 mg/dL 870  891  863   BUN 8 - 23 mg/dL 7  10  12    Creatinine 0.44 - 1.00 mg/dL 9.35  9.54  9.54   Sodium 135 - 145 mmol/L 139  137  140   Potassium 3.5 - 5.1 mmol/L 3.2  3.9  4.2   Chloride 98 - 111 mmol/L 98  101  105   CO2 22 - 32 mmol/L 30  28  26    Calcium  8.9 - 10.3 mg/dL 9.0  8.2  8.7   Total Protein 6.5 - 8.1 g/dL 6.3  6.0  6.0   Total Bilirubin 0.0 - 1.2 mg/dL 0.7  0.6  0.8   Alkaline Phos 38 - 126 U/L 66  83  93   AST 15 - 41 U/L 15  14  23    ALT 0 - 44 U/L 9  11  15       RADIOGRAPHIC STUDIES: I have personally reviewed the radiological images as listed and agreed with the findings in the report. No results found.

## 2024-04-03 NOTE — Assessment & Plan Note (Addendum)
 Bone marrow biopsy results, PET scan findings, lab results were reviewed and discussed with patient. Diagnosis of IgA kappa multiple myeloma was reviewed and discussed.  Lab Results  Component Value Date   MPROTEIN 0.1 (H) 03/06/2024   KPAFRELGTCHN 18.0 03/06/2024   LAMBDASER 5.0 (L) 03/06/2024   KAPLAMBRATIO 3.60 (H) 03/06/2024    Normal Cytogenetics and multiple myeloma FISH testing results are pending. Patient is a transplant candidate.   Labs are reviewed and discussed with patient. M protein and light chain levels have responded very well. VGPR Proceed with cycle 5  Dara RVD.  Discuss with [facility] - she may get bone marrow transplant after 5th or 6th chemotherapy.   Thrombosis prophylaxis, aspirin 81 mg daily. Shingle prophylaxis: acyclovir  She has ongoing dental work, not cleared by dentist for further zometa  treatments.

## 2024-04-03 NOTE — Assessment & Plan Note (Signed)
 Chemotherapy induced. Monitor closely.

## 2024-04-03 NOTE — Assessment & Plan Note (Signed)
 Secondary to multiple myeloma/ chemotherapy  Monitor closely.

## 2024-04-03 NOTE — Assessment & Plan Note (Signed)
Recommend potassium chloride 20meq daily 

## 2024-04-06 ENCOUNTER — Ambulatory Visit: Admitting: Family Medicine

## 2024-04-06 ENCOUNTER — Inpatient Hospital Stay

## 2024-04-06 ENCOUNTER — Ambulatory Visit: Payer: Self-pay | Admitting: Oncology

## 2024-04-06 VITALS — BP 128/58 | HR 67 | Temp 98.6°F | Resp 18

## 2024-04-06 DIAGNOSIS — Z5112 Encounter for antineoplastic immunotherapy: Secondary | ICD-10-CM | POA: Diagnosis not present

## 2024-04-06 DIAGNOSIS — D539 Nutritional anemia, unspecified: Secondary | ICD-10-CM | POA: Diagnosis not present

## 2024-04-06 DIAGNOSIS — Z5111 Encounter for antineoplastic chemotherapy: Secondary | ICD-10-CM | POA: Diagnosis not present

## 2024-04-06 DIAGNOSIS — D696 Thrombocytopenia, unspecified: Secondary | ICD-10-CM | POA: Diagnosis not present

## 2024-04-06 DIAGNOSIS — C9000 Multiple myeloma not having achieved remission: Secondary | ICD-10-CM

## 2024-04-06 DIAGNOSIS — G893 Neoplasm related pain (acute) (chronic): Secondary | ICD-10-CM | POA: Diagnosis not present

## 2024-04-06 DIAGNOSIS — E876 Hypokalemia: Secondary | ICD-10-CM | POA: Diagnosis not present

## 2024-04-06 LAB — MULTIPLE MYELOMA PANEL, SERUM
Albumin SerPl Elph-Mcnc: 3.5 g/dL (ref 2.9–4.4)
Albumin/Glob SerPl: 1.7 (ref 0.7–1.7)
Alpha 1: 0.2 g/dL (ref 0.0–0.4)
Alpha2 Glob SerPl Elph-Mcnc: 0.7 g/dL (ref 0.4–1.0)
B-Globulin SerPl Elph-Mcnc: 0.9 g/dL (ref 0.7–1.3)
Gamma Glob SerPl Elph-Mcnc: 0.3 g/dL — ABNORMAL LOW (ref 0.4–1.8)
Globulin, Total: 2.1 g/dL — ABNORMAL LOW (ref 2.2–3.9)
IgA: 28 mg/dL — ABNORMAL LOW (ref 87–352)
IgG (Immunoglobin G), Serum: 476 mg/dL — ABNORMAL LOW (ref 586–1602)
IgM (Immunoglobulin M), Srm: 8 mg/dL — ABNORMAL LOW (ref 26–217)
M Protein SerPl Elph-Mcnc: 0.1 g/dL — ABNORMAL HIGH
Total Protein ELP: 5.6 g/dL — ABNORMAL LOW (ref 6.0–8.5)

## 2024-04-06 LAB — KAPPA/LAMBDA LIGHT CHAINS
Kappa free light chain: 13.8 mg/L (ref 3.3–19.4)
Kappa, lambda light chain ratio: 1.89 — ABNORMAL HIGH (ref 0.26–1.65)
Lambda free light chains: 7.3 mg/L (ref 5.7–26.3)

## 2024-04-06 MED ORDER — DIPHENHYDRAMINE HCL 25 MG PO CAPS
50.0000 mg | ORAL_CAPSULE | Freq: Once | ORAL | Status: AC
  Administered 2024-04-06: 50 mg via ORAL
  Filled 2024-04-06: qty 2

## 2024-04-06 MED ORDER — BORTEZOMIB CHEMO SQ INJECTION 3.5 MG (2.5MG/ML)
1.3000 mg/m2 | Freq: Once | INTRAMUSCULAR | Status: AC
  Administered 2024-04-06: 2.5 mg via SUBCUTANEOUS
  Filled 2024-04-06: qty 1

## 2024-04-06 MED ORDER — PROCHLORPERAZINE MALEATE 10 MG PO TABS
10.0000 mg | ORAL_TABLET | Freq: Once | ORAL | Status: AC
  Administered 2024-04-06: 10 mg via ORAL
  Filled 2024-04-06: qty 1

## 2024-04-10 ENCOUNTER — Other Ambulatory Visit: Payer: Self-pay

## 2024-04-10 ENCOUNTER — Inpatient Hospital Stay

## 2024-04-10 ENCOUNTER — Other Ambulatory Visit: Payer: Self-pay | Admitting: Oncology

## 2024-04-10 VITALS — BP 129/71 | HR 67 | Temp 98.6°F | Resp 18 | Wt 171.3 lb

## 2024-04-10 DIAGNOSIS — E876 Hypokalemia: Secondary | ICD-10-CM | POA: Diagnosis not present

## 2024-04-10 DIAGNOSIS — Z5111 Encounter for antineoplastic chemotherapy: Secondary | ICD-10-CM | POA: Diagnosis not present

## 2024-04-10 DIAGNOSIS — D696 Thrombocytopenia, unspecified: Secondary | ICD-10-CM | POA: Diagnosis not present

## 2024-04-10 DIAGNOSIS — G893 Neoplasm related pain (acute) (chronic): Secondary | ICD-10-CM | POA: Diagnosis not present

## 2024-04-10 DIAGNOSIS — C9000 Multiple myeloma not having achieved remission: Secondary | ICD-10-CM

## 2024-04-10 DIAGNOSIS — D539 Nutritional anemia, unspecified: Secondary | ICD-10-CM | POA: Diagnosis not present

## 2024-04-10 DIAGNOSIS — Z5112 Encounter for antineoplastic immunotherapy: Secondary | ICD-10-CM | POA: Diagnosis not present

## 2024-04-10 LAB — CMP (CANCER CENTER ONLY)
ALT: 12 U/L (ref 0–44)
AST: 17 U/L (ref 15–41)
Albumin: 3.8 g/dL (ref 3.5–5.0)
Alkaline Phosphatase: 58 U/L (ref 38–126)
Anion gap: 10 (ref 5–15)
BUN: 10 mg/dL (ref 8–23)
CO2: 27 mmol/L (ref 22–32)
Calcium: 8.5 mg/dL — ABNORMAL LOW (ref 8.9–10.3)
Chloride: 99 mmol/L (ref 98–111)
Creatinine: 0.59 mg/dL (ref 0.44–1.00)
GFR, Estimated: >60 mL/min (ref >60)
Glucose, Bld: 163 mg/dL — ABNORMAL HIGH (ref 70–99)
Potassium: 3.8 mmol/L (ref 3.5–5.1)
Sodium: 136 mmol/L (ref 135–145)
Total Bilirubin: 0.9 mg/dL (ref 0.0–1.2)
Total Protein: 6.1 g/dL — ABNORMAL LOW (ref 6.5–8.1)

## 2024-04-10 LAB — CBC WITH DIFFERENTIAL (CANCER CENTER ONLY)
Abs Immature Granulocytes: 0.04 K/uL (ref 0.00–0.07)
Basophils Absolute: 0 K/uL (ref 0.0–0.1)
Basophils Relative: 1 %
Eosinophils Absolute: 0.2 K/uL (ref 0.0–0.5)
Eosinophils Relative: 3 %
HCT: 37.6 % (ref 36.0–46.0)
Hemoglobin: 12.5 g/dL (ref 12.0–15.0)
Immature Granulocytes: 1 %
Lymphocytes Relative: 8 %
Lymphs Abs: 0.5 K/uL — ABNORMAL LOW (ref 0.7–4.0)
MCH: 29.6 pg (ref 26.0–34.0)
MCHC: 33.2 g/dL (ref 30.0–36.0)
MCV: 88.9 fL (ref 80.0–100.0)
Monocytes Absolute: 1.1 K/uL — ABNORMAL HIGH (ref 0.1–1.0)
Monocytes Relative: 16 %
Neutro Abs: 4.6 K/uL (ref 1.7–7.7)
Neutrophils Relative %: 71 %
Platelet Count: 81 K/uL — ABNORMAL LOW (ref 150–400)
RBC: 4.23 MIL/uL (ref 3.87–5.11)
RDW: 15.7 % — ABNORMAL HIGH (ref 11.5–15.5)
WBC Count: 6.5 K/uL (ref 4.0–10.5)
nRBC: 0 % (ref 0.0–0.2)

## 2024-04-10 MED ORDER — LENALIDOMIDE 25 MG PO CAPS: ORAL_CAPSULE | ORAL | 0 refills | Status: DC

## 2024-04-10 MED ORDER — PROCHLORPERAZINE MALEATE 10 MG PO TABS
10.0000 mg | ORAL_TABLET | Freq: Once | ORAL | Status: AC
  Administered 2024-04-10: 10 mg via ORAL
  Filled 2024-04-10: qty 1

## 2024-04-10 MED ORDER — BORTEZOMIB CHEMO SQ INJECTION 3.5 MG (2.5MG/ML)
1.3000 mg/m2 | Freq: Once | INTRAMUSCULAR | Status: AC
  Administered 2024-04-10: 2.5 mg via SUBCUTANEOUS
  Filled 2024-04-10: qty 1

## 2024-04-13 ENCOUNTER — Telehealth: Payer: Self-pay | Admitting: Pharmacist

## 2024-04-13 ENCOUNTER — Inpatient Hospital Stay

## 2024-04-13 ENCOUNTER — Encounter: Payer: Self-pay | Admitting: Oncology

## 2024-04-13 ENCOUNTER — Other Ambulatory Visit: Payer: Self-pay | Admitting: Family Medicine

## 2024-04-13 ENCOUNTER — Other Ambulatory Visit: Payer: Self-pay

## 2024-04-13 VITALS — BP 133/69 | HR 60 | Temp 98.4°F | Resp 16

## 2024-04-13 DIAGNOSIS — C9000 Multiple myeloma not having achieved remission: Secondary | ICD-10-CM | POA: Diagnosis not present

## 2024-04-13 DIAGNOSIS — Z5111 Encounter for antineoplastic chemotherapy: Secondary | ICD-10-CM | POA: Diagnosis not present

## 2024-04-13 DIAGNOSIS — D539 Nutritional anemia, unspecified: Secondary | ICD-10-CM | POA: Diagnosis not present

## 2024-04-13 DIAGNOSIS — E876 Hypokalemia: Secondary | ICD-10-CM | POA: Diagnosis not present

## 2024-04-13 DIAGNOSIS — Z5112 Encounter for antineoplastic immunotherapy: Secondary | ICD-10-CM | POA: Diagnosis not present

## 2024-04-13 DIAGNOSIS — G893 Neoplasm related pain (acute) (chronic): Secondary | ICD-10-CM | POA: Diagnosis not present

## 2024-04-13 DIAGNOSIS — D696 Thrombocytopenia, unspecified: Secondary | ICD-10-CM | POA: Diagnosis not present

## 2024-04-13 MED ORDER — LENALIDOMIDE 25 MG PO CAPS: ORAL_CAPSULE | ORAL | 0 refills | Status: DC

## 2024-04-13 MED ORDER — PROCHLORPERAZINE MALEATE 10 MG PO TABS
10.0000 mg | ORAL_TABLET | Freq: Once | ORAL | Status: AC
  Administered 2024-04-13: 10 mg via ORAL
  Filled 2024-04-13: qty 1

## 2024-04-13 MED ORDER — BORTEZOMIB CHEMO SQ INJECTION 3.5 MG (2.5MG/ML)
1.3000 mg/m2 | Freq: Once | INTRAMUSCULAR | Status: AC
  Administered 2024-04-13: 2.5 mg via SUBCUTANEOUS
  Filled 2024-04-13: qty 1

## 2024-04-13 MED ORDER — DIPHENHYDRAMINE HCL 25 MG PO TABS
50.0000 mg | ORAL_TABLET | Freq: Once | ORAL | Status: AC
  Administered 2024-04-13: 50 mg via ORAL
  Filled 2024-04-13: qty 2

## 2024-04-13 NOTE — Telephone Encounter (Signed)
 Rx sent to local pharmacy on 04/10/24.  Sending to [facility]

## 2024-04-13 NOTE — Telephone Encounter (Signed)
 Was added to secure chat with MD by infusion nurses, asking about patient's Revlimid  schedule.  Today is cycle day 11 but patient reported in infusion that she was starting her Revlimid  back soon, which would be too early based off of an office treatment schedule.  Called patient for additional information.  Spoke with patient, she is taking her Revlimid  as prescribed but is off schedule with an office treatment. It appears this happened from when her treatment was held at the end of [facility] November.  Her treatment was held but she continued her Revlimid  as scheduled and that put her off schedule.  She knows not to resume her Revlimid  until 12/5 to get her back on schedule with her in office treatment.

## 2024-04-14 DIAGNOSIS — I499 Cardiac arrhythmia, unspecified: Secondary | ICD-10-CM | POA: Diagnosis not present

## 2024-04-19 ENCOUNTER — Encounter: Payer: Self-pay | Admitting: Oncology

## 2024-04-19 ENCOUNTER — Encounter: Payer: Self-pay | Admitting: Family Medicine

## 2024-04-19 ENCOUNTER — Ambulatory Visit: Payer: Self-pay | Admitting: Cardiovascular Disease

## 2024-04-20 ENCOUNTER — Other Ambulatory Visit: Payer: Self-pay

## 2024-04-20 ENCOUNTER — Other Ambulatory Visit: Payer: Self-pay | Admitting: Oncology

## 2024-04-20 DIAGNOSIS — M898X9 Other specified disorders of bone, unspecified site: Secondary | ICD-10-CM

## 2024-04-20 DIAGNOSIS — C7951 Secondary malignant neoplasm of bone: Secondary | ICD-10-CM

## 2024-04-20 DIAGNOSIS — F32A Depression, unspecified: Secondary | ICD-10-CM

## 2024-04-20 DIAGNOSIS — G893 Neoplasm related pain (acute) (chronic): Secondary | ICD-10-CM

## 2024-04-20 DIAGNOSIS — D539 Nutritional anemia, unspecified: Secondary | ICD-10-CM

## 2024-04-20 DIAGNOSIS — F419 Anxiety disorder, unspecified: Secondary | ICD-10-CM

## 2024-04-20 MED ORDER — HYDROCODONE-ACETAMINOPHEN 5-325 MG PO TABS: 1.0000 | ORAL_TABLET | Freq: Four times a day (QID) | ORAL | 0 refills | Status: DC | PRN

## 2024-04-20 NOTE — Telephone Encounter (Signed)
 LOV- 01/23/2024 NOV- None [facility]- 02/25/2024 Outpatient Medication Detail   Disp Refills Start End   ALPRAZolam  (XANAX ) 0.5 MG tablet 30 tablet 0 02/25/2024 --   Sig - Route: Take 0.5-1 tablets (0.25-0.5 mg total) by mouth 2 (two) times daily as needed for anxiety. - Oral   Sent to pharmacy as: ALPRAZolam  (XANAX ) 0.5 MG tablet   E-Prescribing Status: Receipt confirmed by pharmacy (02/25/2024  9:23 AM EDT)

## 2024-04-20 NOTE — Telephone Encounter (Signed)
 Converted to rx request

## 2024-04-21 MED ORDER — ALPRAZOLAM 0.5 MG PO TABS: 0.2500 mg | ORAL_TABLET | Freq: Two times a day (BID) | ORAL | 0 refills | Status: AC | PRN

## 2024-04-24 ENCOUNTER — Inpatient Hospital Stay

## 2024-04-24 ENCOUNTER — Encounter: Payer: Self-pay | Admitting: Oncology

## 2024-04-24 ENCOUNTER — Inpatient Hospital Stay: Attending: Oncology

## 2024-04-24 ENCOUNTER — Inpatient Hospital Stay: Admitting: Oncology

## 2024-04-24 VITALS — BP 142/65 | HR 62 | Resp 18

## 2024-04-24 VITALS — BP 128/65 | HR 68 | Temp 98.4°F | Resp 18 | Wt 180.1 lb

## 2024-04-24 DIAGNOSIS — G893 Neoplasm related pain (acute) (chronic): Secondary | ICD-10-CM

## 2024-04-24 DIAGNOSIS — D696 Thrombocytopenia, unspecified: Secondary | ICD-10-CM

## 2024-04-24 DIAGNOSIS — D6959 Other secondary thrombocytopenia: Secondary | ICD-10-CM | POA: Diagnosis not present

## 2024-04-24 DIAGNOSIS — C9000 Multiple myeloma not having achieved remission: Secondary | ICD-10-CM

## 2024-04-24 DIAGNOSIS — Z5111 Encounter for antineoplastic chemotherapy: Secondary | ICD-10-CM | POA: Diagnosis present

## 2024-04-24 DIAGNOSIS — D539 Nutritional anemia, unspecified: Secondary | ICD-10-CM

## 2024-04-24 DIAGNOSIS — Z5112 Encounter for antineoplastic immunotherapy: Secondary | ICD-10-CM | POA: Diagnosis not present

## 2024-04-24 LAB — CBC WITH DIFFERENTIAL (CANCER CENTER ONLY)
Abs Immature Granulocytes: 0.01 K/uL (ref 0.00–0.07)
Basophils Absolute: 0.1 K/uL (ref 0.0–0.1)
Basophils Relative: 1 %
Eosinophils Absolute: 0.3 K/uL (ref 0.0–0.5)
Eosinophils Relative: 5 %
HCT: 35.5 % — ABNORMAL LOW (ref 36.0–46.0)
Hemoglobin: 11.5 g/dL — ABNORMAL LOW (ref 12.0–15.0)
Immature Granulocytes: 0 %
Lymphocytes Relative: 14 %
Lymphs Abs: 0.7 K/uL (ref 0.7–4.0)
MCH: 29.4 pg (ref 26.0–34.0)
MCHC: 32.4 g/dL (ref 30.0–36.0)
MCV: 90.8 fL (ref 80.0–100.0)
Monocytes Absolute: 0.6 K/uL (ref 0.1–1.0)
Monocytes Relative: 11 %
Neutro Abs: 3.3 K/uL (ref 1.7–7.7)
Neutrophils Relative %: 69 %
Platelet Count: 223 K/uL (ref 150–400)
RBC: 3.91 MIL/uL (ref 3.87–5.11)
RDW: 16.1 % — ABNORMAL HIGH (ref 11.5–15.5)
WBC Count: 4.9 K/uL (ref 4.0–10.5)
nRBC: 0 % (ref 0.0–0.2)

## 2024-04-24 LAB — CMP (CANCER CENTER ONLY)
ALT: 9 U/L (ref 0–44)
AST: 18 U/L (ref 15–41)
Albumin: 4.2 g/dL (ref 3.5–5.0)
Alkaline Phosphatase: 62 U/L (ref 38–126)
Anion gap: 9 (ref 5–15)
BUN: 9 mg/dL (ref 8–23)
CO2: 30 mmol/L (ref 22–32)
Calcium: 9.3 mg/dL (ref 8.9–10.3)
Chloride: 102 mmol/L (ref 98–111)
Creatinine: 0.58 mg/dL (ref 0.44–1.00)
GFR, Estimated: >60 mL/min (ref >60)
Glucose, Bld: 149 mg/dL — ABNORMAL HIGH (ref 70–99)
Potassium: 4.2 mmol/L (ref 3.5–5.1)
Sodium: 141 mmol/L (ref 135–145)
Total Bilirubin: 0.3 mg/dL (ref 0.0–1.2)
Total Protein: 5.9 g/dL — ABNORMAL LOW (ref 6.5–8.1)

## 2024-04-24 MED ORDER — BORTEZOMIB CHEMO SQ INJECTION 3.5 MG (2.5MG/ML)
1.3000 mg/m2 | Freq: Once | INTRAMUSCULAR | Status: AC
  Administered 2024-04-24: 2.5 mg via SUBCUTANEOUS
  Filled 2024-04-24: qty 1

## 2024-04-24 MED ORDER — DARATUMUMAB-HYALURONIDASE-FIHJ 1800-30000 MG-UT/15ML ~~LOC~~ SOLN
1800.0000 mg | Freq: Once | SUBCUTANEOUS | Status: AC
  Administered 2024-04-24: 1800 mg via SUBCUTANEOUS
  Filled 2024-04-24: qty 15

## 2024-04-24 MED ORDER — MORPHINE SULFATE ER 15 MG PO TBCR: 15.0000 mg | EXTENDED_RELEASE_TABLET | Freq: Three times a day (TID) | ORAL | 0 refills | Status: DC

## 2024-04-24 MED ORDER — DEXAMETHASONE 4 MG PO TABS
20.0000 mg | ORAL_TABLET | Freq: Once | ORAL | Status: AC
  Administered 2024-04-24: 20 mg via ORAL
  Filled 2024-04-24: qty 5

## 2024-04-24 MED ORDER — ACYCLOVIR 400 MG PO TABS: 400.0000 mg | ORAL_TABLET | Freq: Two times a day (BID) | ORAL | 3 refills | Status: AC

## 2024-04-24 MED ORDER — ACETAMINOPHEN 325 MG PO TABS
650.0000 mg | ORAL_TABLET | Freq: Once | ORAL | Status: AC
  Administered 2024-04-24: 650 mg via ORAL
  Filled 2024-04-24: qty 2

## 2024-04-24 MED ORDER — DIPHENHYDRAMINE HCL 25 MG PO TABS
50.0000 mg | ORAL_TABLET | Freq: Once | ORAL | Status: AC
  Administered 2024-04-24: 50 mg via ORAL
  Filled 2024-04-24: qty 2

## 2024-04-24 NOTE — Assessment & Plan Note (Signed)
 Continue Norco 5/325mg  Q4-6hour PRN.- she uses rarely.  MS Contin  15 mg twice daily to every 8 hours. Back pain is worse, with neuropathic features. Recommend patient to utilize gabapentin , dosage can be titrated up. Discussed about option of MRI lumbar for evaluation and patient prefers to defer and try gabapentin  first.

## 2024-04-24 NOTE — Assessment & Plan Note (Signed)
 Secondary to multiple myeloma/ chemotherapy  Monitor closely.

## 2024-04-24 NOTE — Patient Instructions (Signed)
 [facility] - A DEPT OF [facility]  Discharge Instructions: Thank you for choosing [facility] to provide your oncology and hematology care.  If you have a lab appointment with the Cancer Center, please go directly to the Cancer Center and check in at the registration area.  Wear comfortable clothing and clothing appropriate for easy access to any Portacath or PICC line.   We strive to give you quality time with your provider. You may need to reschedule your appointment if you arrive late (15 or more minutes).  Arriving late affects you and other patients whose appointments are after yours.  Also, if you miss three or more appointments without notifying the office, you may be dismissed from the clinic at the provider's discretion.      For prescription refill requests, have your pharmacy contact our office and allow 72 hours for refills to be completed.    Today you received the following chemotherapy and/or immunotherapy agents Velcade  Daratumumab       To help prevent nausea and vomiting after your treatment, we encourage you to take your nausea medication as directed.  BELOW ARE SYMPTOMS THAT SHOULD BE REPORTED IMMEDIATELY: *FEVER GREATER THAN 100.4 F (38 C) OR HIGHER *CHILLS OR SWEATING *NAUSEA AND VOMITING THAT IS NOT CONTROLLED WITH YOUR NAUSEA MEDICATION *UNUSUAL SHORTNESS OF BREATH *UNUSUAL BRUISING OR BLEEDING *URINARY PROBLEMS (pain or burning when urinating, or frequent urination) *BOWEL PROBLEMS (unusual diarrhea, constipation, pain near the anus) TENDERNESS IN MOUTH AND THROAT WITH OR WITHOUT PRESENCE OF ULCERS (sore throat, sores in mouth, or a toothache) UNUSUAL RASH, SWELLING OR PAIN  UNUSUAL VAGINAL DISCHARGE OR ITCHING   Items with * indicate a potential emergency and should be followed up as soon as possible or go to the Emergency Department if any problems should occur.  Please show the CHEMOTHERAPY ALERT CARD or  IMMUNOTHERAPY ALERT CARD at check-in to the Emergency Department and triage nurse.  Should you have questions after your visit or need to cancel or reschedule your appointment, please contact [facility] - A DEPT OF [facility]  [phone] and follow the prompts.  Office hours are 8:00 a.m. to 4:30 p.m. Monday - Friday. Please note that voicemails left after 4:00 p.m. may not be returned until the following business day.  We are closed weekends and major holidays. You have access to a nurse at all times for urgent questions. Please call the main number to the clinic [phone] and follow the prompts.  For any non-urgent questions, you may also contact your provider using MyChart. We now offer e-Visits for anyone 57 and older to request care online for non-urgent symptoms. For details visit [email or web address].   Also download the MyChart app! Go to the app store, search MyChart, open the app, select [facility], and log in with your MyChart username and password.

## 2024-04-24 NOTE — Progress Notes (Signed)
 Hematology/Oncology Progress note [phone] [phone]       CHIEF COMPLAINTS/PURPOSE OF CONSULTATION:  Multiple myeloma  ASSESSMENT & PLAN:   Multiple myeloma (HCC) Bone marrow biopsy results, PET scan findings, lab results were reviewed and discussed with patient. Diagnosis of IgA kappa multiple myeloma was reviewed and discussed.  Lab Results  Component Value Date   MPROTEIN 0.1 (H) 04/03/2024   KPAFRELGTCHN 13.8 04/03/2024   LAMBDASER 7.3 04/03/2024   KAPLAMBRATIO 1.89 (H) 04/03/2024    Normal Cytogenetics and multiple myeloma FISH testing results are pending. Patient is a transplant candidate.   Labs are reviewed and discussed with patient. M protein and light chain levels have responded very well. VGPR Proceed with cycle 6  Dara RVD.  Discuss with [facility] - pending BM biopsy in January 2026 at [facility]. Pending decision of BM transplant.  Tentatively this is her last chemo treatment prior to [facility] transplant.   Thrombosis prophylaxis, aspirin 81 mg daily. Shingle prophylaxis: acyclovir  She has ongoing dental work, not cleared by dentist for further zometa  treatments.   Macrocytic anemia Secondary to multiple myeloma/ chemotherapy  Monitor closely.  Neoplasm related pain Continue Norco 5/325mg  Q4-6hour PRN.- she uses rarely.  MS Contin  15 mg twice daily to every 8 hours. Back pain is worse, with neuropathic features. Recommend patient to utilize gabapentin , dosage can be titrated up. Discussed about option of MRI lumbar for evaluation and patient prefers to defer and try gabapentin  first.  Thrombocytopenia Chemotherapy induced. Monitor closely   No orders of the defined types were placed in this encounter.  Follow-up per LOS All questions were answered. The patient knows to call the clinic with any problems, questions or concerns.  Zelphia Cap, MD, PhD [facility] Hematology Oncology 04/24/2024    HISTORY OF PRESENTING ILLNESS:  Dana Wallace 68 y.o. female presents for follow up of multiple myeloma   Oncology History  Multiple myeloma (HCC)  12/09/2023 Initial Diagnosis   Multiple myeloma (HCC)  She initially experienced chest discomfort starting in her left shoulder in February, which persisted and led her to seek medical attention in April. Despite a comprehensive workup including a chest x-ray, EKG, echocardiogram, mammogram, and a DEXA scan, all results were normal.  In May, she had two follow-up visits with her primary care physician. The discomfort was primarily located behind her left breast and later moved to her right clavicle and sternum area, becoming very painful and tender to touch. An x-ray at [facility] was performed, and the patient was told there was some type of lesion, after which she underwent an MRI of the right clavicle, shoulder, and scapula. Interval marrow biopsy, lab workup and PET scan. She was found to have stage II IgA kappa multiple myeloma.   12/15/2023 Imaging   PET scan showed  1. Diffuse and extensive lytic bone lesions throughout the axial and appendicular skeleton most likely reflecting multiple myeloma. 2. Pathologic fracture involving the right clavicle. 3. Large soft tissue masses associated with the left second anterior rib and the lower sternum. 4. No findings for a primary neoplastic process involving the neck, chest, abdomen or pelvis   12/18/2023 Bone Marrow Biopsy   Pulmonary biopsy aspirate, clot, core Showed hypercellular bone marrow, involved by kappa restricted plasma cell neoplasm compromising 30-100%, average 70%, of the cellular marrow.   12/25/2023 Cancer Staging   Staging form: Plasma Cell Myeloma and Plasma Cell Disorders, AJCC 8th Edition - Clinical stage from 12/25/2023: Beta-2 -microglobulin (mg/L): 3.9, Albumin (g/dL):  3.2, ISS: Stage II, LDH: Normal - Signed by Babara Call, MD on 12/25/2023 Stage prefix: Initial diagnosis Beta 2 microglobulin range (mg/L): 3.5 to  5.49 Albumin range (g/dL): Less than 3.5   1/84/7974 -  Chemotherapy   Patient is on Treatment Plan : MYELOMA NEWLY DIAGNOSED TRANSPLANT CANDIDATE DaraVRd (Daratumumab  SQ) q21d x 6 Cycles (Induction/Consolidation)       Discussed the use of AI scribe software for clinical note transcription with the patient, who gave verbal consent to proceed.   She takes Norco as needed. She takes MS Contin  15 mg Q8h    Accompanied by husband. Established with [facility] Dr. Rayfield. There is plan of BM biopsy and bone marrow transplant in early 2026.   She has experienced an increase in pain in her lower back and legs over the past two weeks. The pain originates from the lower sacrum and radiates down to the bottom of her feet. She is uncertain if the pain is due to neuropathy. Gabapentin  was taken for the first time yesterday, providing some relief.   She mentions having alprazolam  for anxiety, prescribed by PCP  MEDICAL HISTORY:  Past Medical History:  Diagnosis Date   Anxiety    Diabetes mellitus without complication (HCC)    Hypertension     SURGICAL HISTORY: Past Surgical History:  Procedure Laterality Date   CESAREAN SECTION     CHOLECYSTECTOMY     IR BONE MARROW BIOPSY & ASPIRATION  12/18/2023   TUBAL LIGATION      SOCIAL HISTORY: Social History   Socioeconomic History   Marital status: Married    Spouse name: Not on file   Number of children: Not on file   Years of education: Not on file   Highest education level: Bachelor's degree (e.g., BA, AB, BS)  Occupational History   Not on file  Tobacco Use   Smoking status: Never   Smokeless tobacco: Never  Vaping Use   Vaping status: Never Used  Substance and Sexual Activity   Alcohol use: No   Drug use: Never   Sexual activity: Not Currently    Birth control/protection: None  Other Topics Concern   Not on file  Social History Narrative   Not on file   Social Drivers of Health   Financial Resource Strain: Low Risk   (01/19/2024)   Overall Financial Resource Strain (CARDIA)    Difficulty of Paying Living Expenses: Not hard at all  Food Insecurity: No Food Insecurity (01/19/2024)   Hunger Vital Sign    Worried About Running Out of Food in the Last Year: Never true    Ran Out of Food in the Last Year: Never true  Transportation Needs: No Transportation Needs (01/19/2024)   PRAPARE - Administrator, Civil Service (Medical): No    Lack of Transportation (Non-Medical): No  Physical Activity: Inactive (01/19/2024)   Exercise Vital Sign    Days of Exercise per Week: 0 days    Minutes of Exercise per Session: Not on file  Stress: Stress Concern Present (01/19/2024)   [employer] of Occupational Health - Occupational Stress Questionnaire    Feeling of Stress: To some extent  Social Connections: Moderately Isolated (01/19/2024)   Social Connection and Isolation Panel    Frequency of Communication with Friends and Family: More than three times a week    Frequency of Social Gatherings with Friends and Family: Once a week    Attends Religious Services: Patient declined    Active Member  of Clubs or Organizations: No    Attends Banker Meetings: Not on file    Marital Status: Married  Intimate Partner Violence: Not At Risk (12/09/2023)   Humiliation, Afraid, Rape, and Kick questionnaire    Fear of Current or Ex-Partner: No    Emotionally Abused: No    Physically Abused: No    Sexually Abused: No    FAMILY HISTORY: Family History  Problem Relation Age of Onset   COPD Mother    Multiple sclerosis Mother    Varicose Veins Mother    Hodgkin's lymphoma Sister    Cancer Sister    Breast cancer Neg Hx     ALLERGIES:  has no known allergies.  MEDICATIONS:  Current Outpatient Medications  Medication Sig Dispense Refill   albuterol  (VENTOLIN  HFA) 108 (90 Base) MCG/ACT inhaler Inhale 2 puffs into the lungs every 6 (six) hours as needed for wheezing or shortness of breath. 8 g 2    ALPRAZolam  (XANAX ) 0.5 MG tablet Take 0.5-1 tablets (0.25-0.5 mg total) by mouth 2 (two) times daily as needed for anxiety. 30 tablet 0   aspirin 81 MG tablet Take by mouth.     Blood Glucose Monitoring Suppl (ONE TOUCH ULTRA 2) w/Device KIT 1 each by Does not apply route daily at 2 PM. 1 kit 0   gabapentin  (NEURONTIN ) 100 MG capsule Take 1 capsule (100 mg total) by mouth 2 (two) times daily. 60 capsule 0   glipiZIDE  (GLUCOTROL  XL) 5 MG 24 hr tablet TAKE 1 TABLET(5 MG) BY MOUTH DAILY WITH BREAKFAST 90 tablet 1   glucose blood (ONETOUCH ULTRA) test strip 1 each by Other route daily. Use as instructed 100 each 12   glucose blood (ONETOUCH ULTRA) test strip USE UP TO 4 TIMES DAILY AS DIRECTED 100 strip 2   hydrochlorothiazide  (HYDRODIURIL ) 12.5 MG tablet Take 1 tablet (12.5 mg total) by mouth daily. 90 tablet 3   HYDROcodone -acetaminophen  (NORCO/VICODIN) 5-325 MG tablet Take 1 tablet by mouth every 6 (six) hours as needed for moderate pain (pain score 4-6). 60 tablet 0   lactulose  (CHRONULAC ) 10 GM/15ML solution Take 15 mLs (10 g total) by mouth 2 (two) times daily as needed for mild constipation. 236 mL 1   latanoprost (XALATAN) 0.005 % ophthalmic solution 1 drop at bedtime.     lenalidomide  (REVLIMID ) 25 MG capsule Take 1 capsule by mouth once daily for 14 days on, 7 days off. 14 capsule 0   metFORMIN  (GLUCOPHAGE ) 1000 MG tablet Take 1 tablet (1,000 mg total) by mouth 2 (two) times daily with a meal. 180 tablet 3   metoprolol  succinate (TOPROL -XL) 25 MG 24 hr tablet Take 1 tablet (25 mg total) by mouth daily. Take with or immediately following a meal. 90 tablet 3   montelukast  (SINGULAIR ) 10 MG tablet Take 1 tablet (10 mg total) by mouth daily as needed (wheezing). 10 tablet 0   ondansetron  (ZOFRAN ) 8 MG tablet Take 1 tablet (8 mg total) by mouth every 8 (eight) hours as needed for nausea or vomiting. 30 tablet 1   simvastatin  (ZOCOR ) 10 MG tablet TAKE 1 TABLET(10 MG) BY MOUTH DAILY AT 6 PM 90 tablet  2   acyclovir  (ZOVIRAX ) 400 MG tablet Take 1 tablet (400 mg total) by mouth 2 (two) times daily. 60 tablet 3   morphine  (MS CONTIN ) 15 MG 12 hr tablet Take 1 tablet (15 mg total) by mouth every 8 (eight) hours. 90 tablet 0   naloxone  (NARCAN ) nasal spray  4 mg/0.1 mL SPRAY 1 SPRAY INTO ONE NOSTRIL AS DIRECTED FOR OPIOID OVERDOSE (TURN PERSON ON SIDE AFTER DOSE. IF NO RESPONSE IN 2-3 MINUTES OR PERSON RESPONDS BUT RELAPSES, REPEAT USING A NEW SPRAY DEVICE AND SPRAY INTO THE OTHER NOSTRIL. CALL 911 AFTER USE.) * EMERGENCY USE ONLY * (Patient not taking: Reported on 04/24/2024) 1 each 0   No current facility-administered medications for this visit.   Facility-Administered Medications Ordered in Other Visits  Medication Dose Route Frequency Provider Last Rate Last Admin   bortezomib  SQ (VELCADE ) chemo injection (2.5mg /mL concentration) 2.5 mg  1.3 mg/m2 (Treatment Plan Recorded) Subcutaneous Once Aubrea Meixner, MD       daratumumab -hyaluronidase -fihj (DARZALEX  FASPRO) 1800-30000 MG-UT/15ML chemo SQ injection 1,800 mg  1,800 mg Subcutaneous Once Babara Call, MD        Review of Systems  Constitutional:  Positive for fatigue. Negative for appetite change, chills and fever.  HENT:   Negative for hearing loss and voice change.   Eyes:  Negative for eye problems.  Respiratory:  Negative for chest tightness and cough.   Cardiovascular:  Negative for chest pain.  Gastrointestinal:  Negative for abdominal distention, abdominal pain and blood in stool.  Endocrine: Negative for hot flashes.  Genitourinary:  Negative for difficulty urinating and frequency.   Musculoskeletal:  Positive for arthralgias.       Shoulder, upper chest, hip pain improved.   Skin:  Negative for itching and rash.  Neurological:  Negative for extremity weakness.  Hematological:  Negative for adenopathy.  Psychiatric/Behavioral:  Negative for confusion. The patient is nervous/anxious.      PHYSICAL EXAMINATION: ECOG PERFORMANCE STATUS:  1 - Symptomatic but completely ambulatory  Vitals:   04/24/24 0915  BP: 128/65  Pulse: 68  Resp: 18  Temp: 98.4 F (36.9 C)  SpO2: 98%   Filed Weights   04/24/24 0915  Weight: 180 lb 1.6 oz (81.7 kg)    Physical Exam Constitutional:      General: She is not in acute distress.    Appearance: She is not diaphoretic.  HENT:     Head: Normocephalic and atraumatic.  Eyes:     General: No scleral icterus. Cardiovascular:     Rate and Rhythm: Normal rate.     Heart sounds: No murmur heard. Pulmonary:     Effort: Pulmonary effort is normal. No respiratory distress.     Breath sounds: Normal breath sounds. No wheezing.  Abdominal:     General: Bowel sounds are normal. There is no distension.     Palpations: Abdomen is soft.     Tenderness: There is no abdominal tenderness.  Musculoskeletal:        General: Normal range of motion.     Cervical back: Normal range of motion and neck supple.  Skin:    General: Skin is warm and dry.     Findings: No erythema.  Neurological:     Mental Status: She is alert and oriented to person, place, and time. Mental status is at baseline.     Cranial Nerves: No cranial nerve deficit.     Motor: No abnormal muscle tone.     Coordination: Coordination normal.  Psychiatric:        Mood and Affect: Mood and affect normal.      LABORATORY DATA:  I have reviewed the data as listed    Latest Ref Rng & Units 04/24/2024    9:04 AM 04/10/2024    9:01 AM 04/03/2024  8:23 AM  CBC  WBC 4.0 - 10.5 K/uL 4.9  6.5  4.7   Hemoglobin 12.0 - 15.0 g/dL 88.4  87.4  87.9   Hematocrit 36.0 - 46.0 % 35.5  37.6  37.0   Platelets 150 - 400 K/uL 223  81  209       Latest Ref Rng & Units 04/24/2024    9:03 AM 04/10/2024    9:01 AM 04/03/2024    8:23 AM  CMP  Glucose 70 - 99 mg/dL 850  836  870   BUN 8 - 23 mg/dL 9  10  7    Creatinine 0.44 - 1.00 mg/dL 9.41  9.40  9.35   Sodium 135 - 145 mmol/L 141  136  139   Potassium 3.5 - 5.1 mmol/L 4.2  3.8  3.2    Chloride 98 - 111 mmol/L 102  99  98   CO2 22 - 32 mmol/L 30  27  30    Calcium  8.9 - 10.3 mg/dL 9.3  8.5  9.0   Total Protein 6.5 - 8.1 g/dL 5.9  6.1  6.3   Total Bilirubin 0.0 - 1.2 mg/dL 0.3  0.9  0.7   Alkaline Phos 38 - 126 U/L 62  58  66   AST 15 - 41 U/L 18  17  15    ALT 0 - 44 U/L 9  12  9       RADIOGRAPHIC STUDIES: I have personally reviewed the radiological images as listed and agreed with the findings in the report. LONG TERM MONITOR (3-14 DAYS) Result Date: 04/14/2024 Event monitor Patch Wear Time:  13 days and 14 hours (2025-10-23T18:55:14-0400 to 2025-11-06T09:32:42-0500) Normal sinus rhythm Patient had a min HR of 53 bpm, max HR of 129 bpm, and avg HR of 72 bpm. 3 Supraventricular Tachycardia runs occurred, the run with the fastest interval lasting 14 beats with a max rate of 129 bpm (avg 113 bpm); the run with the fastest interval was also the longest. Isolated SVEs were rare (<1.0%), SVE Couplets were rare (<1.0%), and no SVE Triplets were present. Isolated VEs were frequent (11.9%, 160017), VE Couplets were rare (<1.0%, 363), and VE Triplets were rare (<1.0%, 45). Ventricular Bigeminy and Trigeminy were present. No patient triggered events recorded Signed, Velinda Lunger, MD, Ph.D [facility]

## 2024-04-24 NOTE — Assessment & Plan Note (Signed)
 Chemotherapy induced. Monitor closely.

## 2024-04-24 NOTE — Assessment & Plan Note (Addendum)
 Bone marrow biopsy results, PET scan findings, lab results were reviewed and discussed with patient. Diagnosis of IgA kappa multiple myeloma was reviewed and discussed.  Lab Results  Component Value Date   MPROTEIN 0.1 (H) 04/03/2024   KPAFRELGTCHN 13.8 04/03/2024   LAMBDASER 7.3 04/03/2024   KAPLAMBRATIO 1.89 (H) 04/03/2024    Normal Cytogenetics and multiple myeloma FISH testing results are pending. Patient is a transplant candidate.   Labs are reviewed and discussed with patient. M protein and light chain levels have responded very well. VGPR Proceed with cycle 6  Dara RVD.  Discuss with [facility] - pending BM biopsy in January 2026 at [facility]. Pending decision of BM transplant.  Tentatively this is her last chemo treatment prior to [facility] transplant.   Thrombosis prophylaxis, aspirin 81 mg daily. Shingle prophylaxis: acyclovir  She has ongoing dental work, not cleared by dentist for further zometa  treatments.

## 2024-04-27 ENCOUNTER — Inpatient Hospital Stay

## 2024-04-27 VITALS — BP 130/65 | HR 72 | Temp 96.4°F | Resp 18

## 2024-04-27 DIAGNOSIS — Z5111 Encounter for antineoplastic chemotherapy: Secondary | ICD-10-CM | POA: Diagnosis not present

## 2024-04-27 DIAGNOSIS — C9000 Multiple myeloma not having achieved remission: Secondary | ICD-10-CM

## 2024-04-27 LAB — MULTIPLE MYELOMA PANEL, SERUM
Albumin SerPl Elph-Mcnc: 3.4 g/dL (ref 2.9–4.4)
Albumin/Glob SerPl: 1.8 — ABNORMAL HIGH (ref 0.7–1.7)
Alpha 1: 0.2 g/dL (ref 0.0–0.4)
Alpha2 Glob SerPl Elph-Mcnc: 0.7 g/dL (ref 0.4–1.0)
B-Globulin SerPl Elph-Mcnc: 0.9 g/dL (ref 0.7–1.3)
Gamma Glob SerPl Elph-Mcnc: 0.3 g/dL — ABNORMAL LOW (ref 0.4–1.8)
Globulin, Total: 2 g/dL — ABNORMAL LOW (ref 2.2–3.9)
IgA: 21 mg/dL — ABNORMAL LOW (ref 87–352)
IgG (Immunoglobin G), Serum: 434 mg/dL — ABNORMAL LOW (ref 586–1602)
IgM (Immunoglobulin M), Srm: 6 mg/dL — ABNORMAL LOW (ref 26–217)
M Protein SerPl Elph-Mcnc: 0.1 g/dL — ABNORMAL HIGH
Total Protein ELP: 5.4 g/dL — ABNORMAL LOW (ref 6.0–8.5)

## 2024-04-27 LAB — KAPPA/LAMBDA LIGHT CHAINS
Kappa free light chain: 9.6 mg/L (ref 3.3–19.4)
Kappa, lambda light chain ratio: 2.4 — ABNORMAL HIGH (ref 0.26–1.65)
Lambda free light chains: 4 mg/L — ABNORMAL LOW (ref 5.7–26.3)

## 2024-04-27 MED ORDER — DIPHENHYDRAMINE HCL 25 MG PO TABS
50.0000 mg | ORAL_TABLET | Freq: Once | ORAL | Status: AC
  Administered 2024-04-27: 50 mg via ORAL
  Filled 2024-04-27: qty 2

## 2024-04-27 MED ORDER — BORTEZOMIB CHEMO SQ INJECTION 3.5 MG (2.5MG/ML)
1.3000 mg/m2 | Freq: Once | INTRAMUSCULAR | Status: AC
  Administered 2024-04-27: 2.5 mg via SUBCUTANEOUS
  Filled 2024-04-27: qty 1

## 2024-04-27 MED ORDER — PROCHLORPERAZINE MALEATE 10 MG PO TABS
10.0000 mg | ORAL_TABLET | Freq: Once | ORAL | Status: AC
  Administered 2024-04-27: 10 mg via ORAL
  Filled 2024-04-27: qty 1

## 2024-04-28 ENCOUNTER — Telehealth: Payer: Self-pay

## 2024-04-28 NOTE — Telephone Encounter (Signed)
 VM from Powell Mania, [facility] transplant coordinator [phone]  Reviewed Dates of last treatment.   She said that the plan is to hold off treatment for now. BMT planned for January. She will fax the calendar and contact us  to set up appts after the transplant. Most likely mid - late Feb.

## 2024-04-29 ENCOUNTER — Ambulatory Visit: Payer: Self-pay | Admitting: Oncology

## 2024-04-30 NOTE — Progress Notes (Signed)
 Left message for BMT coordinator for a fax number.

## 2024-04-30 NOTE — Progress Notes (Signed)
 Labs faxed

## 2024-05-01 ENCOUNTER — Inpatient Hospital Stay

## 2024-05-01 VITALS — BP 139/56 | HR 70 | Temp 99.0°F | Resp 18 | Wt 173.1 lb

## 2024-05-01 DIAGNOSIS — C9000 Multiple myeloma not having achieved remission: Secondary | ICD-10-CM

## 2024-05-01 DIAGNOSIS — Z5111 Encounter for antineoplastic chemotherapy: Secondary | ICD-10-CM | POA: Diagnosis not present

## 2024-05-01 LAB — CMP (CANCER CENTER ONLY)
ALT: 11 U/L (ref 0–44)
AST: 17 U/L (ref 15–41)
Albumin: 4.2 g/dL (ref 3.5–5.0)
Alkaline Phosphatase: 63 U/L (ref 38–126)
Anion gap: 10 (ref 5–15)
BUN: 9 mg/dL (ref 8–23)
CO2: 32 mmol/L (ref 22–32)
Calcium: 9.3 mg/dL (ref 8.9–10.3)
Chloride: 98 mmol/L (ref 98–111)
Creatinine: 0.62 mg/dL (ref 0.44–1.00)
GFR, Estimated: >60 mL/min (ref >60)
Glucose, Bld: 177 mg/dL — ABNORMAL HIGH (ref 70–99)
Potassium: 4.1 mmol/L (ref 3.5–5.1)
Sodium: 139 mmol/L (ref 135–145)
Total Bilirubin: 0.7 mg/dL (ref 0.0–1.2)
Total Protein: 6.3 g/dL — ABNORMAL LOW (ref 6.5–8.1)

## 2024-05-01 LAB — CBC WITH DIFFERENTIAL (CANCER CENTER ONLY)
Abs Immature Granulocytes: 0.02 K/uL (ref 0.00–0.07)
Basophils Absolute: 0 K/uL (ref 0.0–0.1)
Basophils Relative: 1 %
Eosinophils Absolute: 0.5 K/uL (ref 0.0–0.5)
Eosinophils Relative: 8 %
HCT: 38.8 % (ref 36.0–46.0)
Hemoglobin: 12.7 g/dL (ref 12.0–15.0)
Immature Granulocytes: 0 %
Lymphocytes Relative: 10 %
Lymphs Abs: 0.5 K/uL — ABNORMAL LOW (ref 0.7–4.0)
MCH: 29.4 pg (ref 26.0–34.0)
MCHC: 32.7 g/dL (ref 30.0–36.0)
MCV: 89.8 fL (ref 80.0–100.0)
Monocytes Absolute: 0.5 K/uL (ref 0.1–1.0)
Monocytes Relative: 9 %
Neutro Abs: 4.1 K/uL (ref 1.7–7.7)
Neutrophils Relative %: 72 %
Platelet Count: 102 K/uL — ABNORMAL LOW (ref 150–400)
RBC: 4.32 MIL/uL (ref 3.87–5.11)
RDW: 15.9 % — ABNORMAL HIGH (ref 11.5–15.5)
WBC Count: 5.6 K/uL (ref 4.0–10.5)
nRBC: 0 % (ref 0.0–0.2)

## 2024-05-01 MED ORDER — PROCHLORPERAZINE MALEATE 10 MG PO TABS
10.0000 mg | ORAL_TABLET | Freq: Once | ORAL | Status: AC
  Administered 2024-05-01: 10 mg via ORAL
  Filled 2024-05-01: qty 1

## 2024-05-01 MED ORDER — BORTEZOMIB CHEMO SQ INJECTION 3.5 MG (2.5MG/ML)
1.3000 mg/m2 | Freq: Once | INTRAMUSCULAR | Status: AC
  Administered 2024-05-01: 2.5 mg via SUBCUTANEOUS
  Filled 2024-05-01: qty 1

## 2024-05-01 NOTE — Patient Instructions (Signed)
 [facility] - A DEPT OF [facility]  Discharge Instructions: Thank you for choosing [facility] to provide your oncology and hematology care.  If you have a lab appointment with the Cancer Center, please go directly to the Cancer Center and check in at the registration area.  Wear comfortable clothing and clothing appropriate for easy access to any Portacath or PICC line.   We strive to give you quality time with your provider. You may need to reschedule your appointment if you arrive late (15 or more minutes).  Arriving late affects you and other patients whose appointments are after yours.  Also, if you miss three or more appointments without notifying the office, you may be dismissed from the clinic at the provider's discretion.      For prescription refill requests, have your pharmacy contact our office and allow 72 hours for refills to be completed.    Today you received the following chemotherapy and/or immunotherapy agents Velcade       To help prevent nausea and vomiting after your treatment, we encourage you to take your nausea medication as directed.  BELOW ARE SYMPTOMS THAT SHOULD BE REPORTED IMMEDIATELY: *FEVER GREATER THAN 100.4 F (38 C) OR HIGHER *CHILLS OR SWEATING *NAUSEA AND VOMITING THAT IS NOT CONTROLLED WITH YOUR NAUSEA MEDICATION *UNUSUAL SHORTNESS OF BREATH *UNUSUAL BRUISING OR BLEEDING *URINARY PROBLEMS (pain or burning when urinating, or frequent urination) *BOWEL PROBLEMS (unusual diarrhea, constipation, pain near the anus) TENDERNESS IN MOUTH AND THROAT WITH OR WITHOUT PRESENCE OF ULCERS (sore throat, sores in mouth, or a toothache) UNUSUAL RASH, SWELLING OR PAIN  UNUSUAL VAGINAL DISCHARGE OR ITCHING   Items with * indicate a potential emergency and should be followed up as soon as possible or go to the Emergency Department if any problems should occur.  Please show the CHEMOTHERAPY ALERT CARD or IMMUNOTHERAPY  ALERT CARD at check-in to the Emergency Department and triage nurse.  Should you have questions after your visit or need to cancel or reschedule your appointment, please contact [facility] - A DEPT OF [facility]  [phone] and follow the prompts.  Office hours are 8:00 a.m. to 4:30 p.m. Monday - Friday. Please note that voicemails left after 4:00 p.m. may not be returned until the following business day.  We are closed weekends and major holidays. You have access to a nurse at all times for urgent questions. Please call the main number to the clinic [phone] and follow the prompts.  For any non-urgent questions, you may also contact your provider using MyChart. We now offer e-Visits for anyone 63 and older to request care online for non-urgent symptoms. For details visit [email or web address].   Also download the MyChart app! Go to the app store, search MyChart, open the app, select [facility], and log in with your MyChart username and password.

## 2024-05-04 ENCOUNTER — Inpatient Hospital Stay

## 2024-05-04 ENCOUNTER — Other Ambulatory Visit: Payer: Self-pay | Admitting: Oncology

## 2024-05-04 VITALS — BP 120/48 | HR 75 | Temp 97.1°F | Resp 17

## 2024-05-04 DIAGNOSIS — C9000 Multiple myeloma not having achieved remission: Secondary | ICD-10-CM

## 2024-05-04 DIAGNOSIS — Z5111 Encounter for antineoplastic chemotherapy: Secondary | ICD-10-CM | POA: Diagnosis not present

## 2024-05-04 MED ORDER — DIPHENHYDRAMINE HCL 25 MG PO TABS
50.0000 mg | ORAL_TABLET | Freq: Once | ORAL | Status: AC
  Administered 2024-05-04: 13:00:00 50 mg via ORAL
  Filled 2024-05-04: qty 2

## 2024-05-04 MED ORDER — BORTEZOMIB CHEMO SQ INJECTION 3.5 MG (2.5MG/ML)
1.3000 mg/m2 | Freq: Once | INTRAMUSCULAR | Status: AC
  Administered 2024-05-04: 14:00:00 2.5 mg via SUBCUTANEOUS
  Filled 2024-05-04: qty 1

## 2024-05-04 MED ORDER — PROCHLORPERAZINE MALEATE 10 MG PO TABS
10.0000 mg | ORAL_TABLET | Freq: Once | ORAL | Status: AC
  Administered 2024-05-04: 13:00:00 10 mg via ORAL
  Filled 2024-05-04: qty 1

## 2024-05-04 NOTE — Patient Instructions (Signed)
 [facility] - A DEPT OF [facility]  Discharge Instructions: Thank you for choosing [facility] to provide your oncology and hematology care.  If you have a lab appointment with the Cancer Center, please go directly to the Cancer Center and check in at the registration area.  Wear comfortable clothing and clothing appropriate for easy access to any Portacath or PICC line.   We strive to give you quality time with your provider. You may need to reschedule your appointment if you arrive late (15 or more minutes).  Arriving late affects you and other patients whose appointments are after yours.  Also, if you miss three or more appointments without notifying the office, you may be dismissed from the clinic at the provider's discretion.      For prescription refill requests, have your pharmacy contact our office and allow 72 hours for refills to be completed.    Today you received the following chemotherapy and/or immunotherapy agents Velcade       To help prevent nausea and vomiting after your treatment, we encourage you to take your nausea medication as directed.  BELOW ARE SYMPTOMS THAT SHOULD BE REPORTED IMMEDIATELY: *FEVER GREATER THAN 100.4 F (38 C) OR HIGHER *CHILLS OR SWEATING *NAUSEA AND VOMITING THAT IS NOT CONTROLLED WITH YOUR NAUSEA MEDICATION *UNUSUAL SHORTNESS OF BREATH *UNUSUAL BRUISING OR BLEEDING *URINARY PROBLEMS (pain or burning when urinating, or frequent urination) *BOWEL PROBLEMS (unusual diarrhea, constipation, pain near the anus) TENDERNESS IN MOUTH AND THROAT WITH OR WITHOUT PRESENCE OF ULCERS (sore throat, sores in mouth, or a toothache) UNUSUAL RASH, SWELLING OR PAIN  UNUSUAL VAGINAL DISCHARGE OR ITCHING   Items with * indicate a potential emergency and should be followed up as soon as possible or go to the Emergency Department if any problems should occur.  Please show the CHEMOTHERAPY ALERT CARD or IMMUNOTHERAPY  ALERT CARD at check-in to the Emergency Department and triage nurse.  Should you have questions after your visit or need to cancel or reschedule your appointment, please contact [facility] - A DEPT OF [facility]  [phone] and follow the prompts.  Office hours are 8:00 a.m. to 4:30 p.m. Monday - Friday. Please note that voicemails left after 4:00 p.m. may not be returned until the following business day.  We are closed weekends and major holidays. You have access to a nurse at all times for urgent questions. Please call the main number to the clinic [phone] and follow the prompts.  For any non-urgent questions, you may also contact your provider using MyChart. We now offer e-Visits for anyone 63 and older to request care online for non-urgent symptoms. For details visit [email or web address].   Also download the MyChart app! Go to the app store, search MyChart, open the app, select [facility], and log in with your MyChart username and password.

## 2024-05-05 DIAGNOSIS — K080 Exfoliation of teeth due to systemic causes: Secondary | ICD-10-CM | POA: Diagnosis not present

## 2024-05-08 DIAGNOSIS — K080 Exfoliation of teeth due to systemic causes: Secondary | ICD-10-CM | POA: Diagnosis not present

## 2024-05-22 ENCOUNTER — Encounter: Payer: Self-pay | Admitting: Oncology

## 2024-05-22 ENCOUNTER — Other Ambulatory Visit: Payer: Self-pay

## 2024-05-22 DIAGNOSIS — D539 Nutritional anemia, unspecified: Secondary | ICD-10-CM

## 2024-05-22 DIAGNOSIS — M898X9 Other specified disorders of bone, unspecified site: Secondary | ICD-10-CM

## 2024-05-22 DIAGNOSIS — G893 Neoplasm related pain (acute) (chronic): Secondary | ICD-10-CM

## 2024-05-22 DIAGNOSIS — C7951 Secondary malignant neoplasm of bone: Secondary | ICD-10-CM

## 2024-05-22 MED ORDER — HYDROCODONE-ACETAMINOPHEN 5-325 MG PO TABS: 1.0000 | ORAL_TABLET | Freq: Four times a day (QID) | ORAL | 0 refills | Status: DC | PRN

## 2024-05-22 NOTE — Telephone Encounter (Signed)
 Hi ladies, can one of assist with Hydrocodone  refill. I pended it to your box

## 2024-05-27 NOTE — Progress Notes (Signed)
 Tentative transplant calendar reviewed with patient at the visit today. Side effects of filgrastim, mozobil, and melphalan, as well as auto stem cell transplant were reviewed and patient verbalized understanding. Education that reviewed on filgrastim, CVC care and apheresis as well as handbook were confirmed as received by patient at initial consult. Reviewed infection risk management strategies during transplant including  process for reporting 100.5 degrees farenheit or greater temperature, frequent handwashing, avoiding sick people and crowds.  Patient verbalized understanding regarding not gardening immediately following transplant, not being in close proximity to mowing, construction or golfing. Reviewed walking 1 mile a day during transplant and focusing on protein intake over multiple small meals throughout the day. If need be, 2 protein shakes with multiple snacks throughout the day could be appropriate. Noted patient has lost significant amount of weight since 03/24/24 (83kg) at that time (74.3kg now). Per patient and spouse, she has been struggling with appetite/nausea but it has improved since holding treatment in preparation for high dose chemotherapy for transplant. Per CareEverywhere 78.5kg on 05/01/24 so appears to be real. Discussed with pharmD regarding dose Nivestym that is ready in pharmacy today. Vertell An, pharmD recommended checking on PA/copay status for the dose and we will most likely cancel the 900mcg dose and order dose instead for her to for pick up on 06/03/24. Discussed significant weight loss with provider and she had already discussed nausea management and put in a nutrition consult for patient.  Patient and spouse verbalized understanding of the plan and education provided. Emotional support provided as patient is very anxious about this process.

## 2024-05-28 NOTE — Progress Notes (Signed)
 Please submit for insurance approval for auto transplant with mozobil. Notes/results sent via document in EPIC. A few viral studies and her bmbx results are still pending. Calendar attached to email. Pt must start G on 06/06/24.

## 2024-05-29 ENCOUNTER — Telehealth: Payer: Self-pay | Admitting: *Deleted

## 2024-05-29 ENCOUNTER — Other Ambulatory Visit: Payer: Self-pay | Admitting: Oncology

## 2024-05-29 DIAGNOSIS — C9000 Multiple myeloma not having achieved remission: Secondary | ICD-10-CM

## 2024-05-29 NOTE — Telephone Encounter (Signed)
 Dr. Babara will restart patient on tx next week along with Revlimid . Message left with this plan on Heather's confidential VM. Callback number also provided.

## 2024-05-29 NOTE — Telephone Encounter (Signed)
 [phone] Moats from [facility] calling inform Dr. Babara and her team that the plan for transplant is changing slightly. They need to delay high dose chemotherapy due to cardiac clearance. Will start back on treatment on 06/15/24.

## 2024-06-01 ENCOUNTER — Other Ambulatory Visit: Payer: Self-pay

## 2024-06-01 DIAGNOSIS — C9000 Multiple myeloma not having achieved remission: Secondary | ICD-10-CM

## 2024-06-01 MED ORDER — LENALIDOMIDE 25 MG PO CAPS: ORAL_CAPSULE | ORAL | 0 refills | Status: DC

## 2024-06-01 NOTE — Telephone Encounter (Signed)
 Spoke to pt regarding plan and she verbalized understanding.

## 2024-06-03 ENCOUNTER — Other Ambulatory Visit: Payer: Self-pay | Admitting: Oncology

## 2024-06-03 NOTE — Telephone Encounter (Signed)
 Received call from [facility] BMT coordinator stating that pt will proceed with stem cell collection and hold the hight dose chemo. The collection will be next week so they want her to resume tx here on 1/26 or after. MD notified and has updated IS.   Called pt, no answer. Detailed message left with appt detail changes. Mychart message also sent.

## 2024-06-04 NOTE — Telephone Encounter (Signed)
 Spoke to pt and she is aware of changes.

## 2024-06-05 ENCOUNTER — Inpatient Hospital Stay

## 2024-06-05 ENCOUNTER — Inpatient Hospital Stay: Admitting: Oncology

## 2024-06-08 ENCOUNTER — Inpatient Hospital Stay

## 2024-06-12 ENCOUNTER — Inpatient Hospital Stay

## 2024-06-15 ENCOUNTER — Inpatient Hospital Stay

## 2024-06-18 ENCOUNTER — Inpatient Hospital Stay: Attending: Oncology

## 2024-06-18 ENCOUNTER — Inpatient Hospital Stay

## 2024-06-18 ENCOUNTER — Encounter: Payer: Self-pay | Admitting: Oncology

## 2024-06-18 ENCOUNTER — Inpatient Hospital Stay: Admitting: Oncology

## 2024-06-18 VITALS — BP 140/63 | HR 68 | Temp 98.1°F | Resp 18 | Wt 161.9 lb

## 2024-06-18 DIAGNOSIS — R771 Abnormality of globulin: Secondary | ICD-10-CM

## 2024-06-18 DIAGNOSIS — C9000 Multiple myeloma not having achieved remission: Secondary | ICD-10-CM | POA: Diagnosis not present

## 2024-06-18 DIAGNOSIS — C7951 Secondary malignant neoplasm of bone: Secondary | ICD-10-CM

## 2024-06-18 DIAGNOSIS — G893 Neoplasm related pain (acute) (chronic): Secondary | ICD-10-CM

## 2024-06-18 DIAGNOSIS — D539 Nutritional anemia, unspecified: Secondary | ICD-10-CM | POA: Insufficient documentation

## 2024-06-18 DIAGNOSIS — Z5112 Encounter for antineoplastic immunotherapy: Secondary | ICD-10-CM | POA: Diagnosis not present

## 2024-06-18 DIAGNOSIS — G620 Drug-induced polyneuropathy: Secondary | ICD-10-CM

## 2024-06-18 DIAGNOSIS — T451X5A Adverse effect of antineoplastic and immunosuppressive drugs, initial encounter: Secondary | ICD-10-CM | POA: Diagnosis not present

## 2024-06-18 DIAGNOSIS — M898X9 Other specified disorders of bone, unspecified site: Secondary | ICD-10-CM

## 2024-06-18 DIAGNOSIS — Z5111 Encounter for antineoplastic chemotherapy: Secondary | ICD-10-CM | POA: Insufficient documentation

## 2024-06-18 LAB — CMP (CANCER CENTER ONLY)
ALT: 13 U/L (ref 0–44)
AST: 20 U/L (ref 15–41)
Albumin: 4.4 g/dL (ref 3.5–5.0)
Alkaline Phosphatase: 98 U/L (ref 38–126)
Anion gap: 12 (ref 5–15)
BUN: 10 mg/dL (ref 8–23)
CO2: 28 mmol/L (ref 22–32)
Calcium: 9.8 mg/dL (ref 8.9–10.3)
Chloride: 99 mmol/L (ref 98–111)
Creatinine: 0.6 mg/dL (ref 0.44–1.00)
GFR, Estimated: >60 mL/min
Glucose, Bld: 139 mg/dL — ABNORMAL HIGH (ref 70–99)
Potassium: 4.1 mmol/L (ref 3.5–5.1)
Sodium: 140 mmol/L (ref 135–145)
Total Bilirubin: 0.8 mg/dL (ref 0.0–1.2)
Total Protein: 6.4 g/dL — ABNORMAL LOW (ref 6.5–8.1)

## 2024-06-18 LAB — CBC WITH DIFFERENTIAL (CANCER CENTER ONLY)
Abs Immature Granulocytes: 0.07 10*3/uL (ref 0.00–0.07)
Basophils Absolute: 0 10*3/uL (ref 0.0–0.1)
Basophils Relative: 0 %
Eosinophils Absolute: 0.1 10*3/uL (ref 0.0–0.5)
Eosinophils Relative: 2 %
HCT: 38.1 % (ref 36.0–46.0)
Hemoglobin: 12.8 g/dL (ref 12.0–15.0)
Immature Granulocytes: 1 %
Lymphocytes Relative: 7 %
Lymphs Abs: 0.6 10*3/uL — ABNORMAL LOW (ref 0.7–4.0)
MCH: 30.7 pg (ref 26.0–34.0)
MCHC: 33.6 g/dL (ref 30.0–36.0)
MCV: 91.4 fL (ref 80.0–100.0)
Monocytes Absolute: 0.5 10*3/uL (ref 0.1–1.0)
Monocytes Relative: 5 %
Neutro Abs: 7.2 10*3/uL (ref 1.7–7.7)
Neutrophils Relative %: 85 %
Platelet Count: 164 10*3/uL (ref 150–400)
RBC: 4.17 MIL/uL (ref 3.87–5.11)
RDW: 16.9 % — ABNORMAL HIGH (ref 11.5–15.5)
WBC Count: 8.4 10*3/uL (ref 4.0–10.5)
nRBC: 0 % (ref 0.0–0.2)

## 2024-06-18 MED ORDER — MORPHINE SULFATE ER 15 MG PO TBCR: 15.0000 mg | EXTENDED_RELEASE_TABLET | Freq: Three times a day (TID) | ORAL | 0 refills | Status: AC

## 2024-06-18 MED ORDER — HYDROCODONE-ACETAMINOPHEN 5-325 MG PO TABS: 1.0000 | ORAL_TABLET | Freq: Four times a day (QID) | ORAL | 0 refills | Status: AC | PRN

## 2024-06-18 MED ORDER — DARATUMUMAB-HYALURONIDASE-FIHJ 1800-30000 MG-UT/15ML ~~LOC~~ SOLN
1800.0000 mg | Freq: Once | SUBCUTANEOUS | Status: AC
  Administered 2024-06-18: 1800 mg via SUBCUTANEOUS
  Filled 2024-06-18: qty 15

## 2024-06-18 MED ORDER — DEXAMETHASONE 4 MG PO TABS
20.0000 mg | ORAL_TABLET | Freq: Once | ORAL | Status: AC
  Administered 2024-06-18: 20 mg via ORAL
  Filled 2024-06-18: qty 5

## 2024-06-18 MED ORDER — GABAPENTIN 300 MG PO CAPS: 300.0000 mg | ORAL_CAPSULE | Freq: Two times a day (BID) | ORAL | 0 refills | Status: AC

## 2024-06-18 MED ORDER — DIPHENHYDRAMINE HCL 25 MG PO TABS
50.0000 mg | ORAL_TABLET | Freq: Once | ORAL | Status: AC
  Administered 2024-06-18: 50 mg via ORAL
  Filled 2024-06-18: qty 2

## 2024-06-18 MED ORDER — BORTEZOMIB CHEMO SQ INJECTION 3.5 MG (2.5MG/ML)
1.0000 mg/m2 | Freq: Once | INTRAMUSCULAR | Status: AC
  Administered 2024-06-18: 1.75 mg via SUBCUTANEOUS
  Filled 2024-06-18: qty 0.7

## 2024-06-18 MED ORDER — ACETAMINOPHEN 325 MG PO TABS
650.0000 mg | ORAL_TABLET | Freq: Once | ORAL | Status: AC
  Administered 2024-06-18: 650 mg via ORAL
  Filled 2024-06-18: qty 2

## 2024-06-18 NOTE — Assessment & Plan Note (Addendum)
 Bone marrow biopsy results, PET scan findings, lab results were reviewed and discussed with patient. Diagnosis of IgA kappa multiple myeloma was reviewed and discussed.  Lab Results  Component Value Date   MPROTEIN 0.1 (H) 04/24/2024   KPAFRELGTCHN 9.6 04/24/2024   LAMBDASER 4.0 (L) 04/24/2024   KAPLAMBRATIO 2.40 (H) 04/24/2024    Normal Cytogenetics and multiple myeloma FISH testing results are pending. Patient is a transplant candidate.   Labs are reviewed and discussed with patient. M protein and light chain levels have responded very well. VGPR Proceed with cycle 7  Dara RVD.  Due to neuropathy symptoms, I adjusted the chemotherapy to weekly dose reduced Velcade .  Thrombosis prophylaxis, aspirin 81 mg daily. Shingle prophylaxis: acyclovir  She has ongoing dental work, not cleared by dentist for further zometa  treatments.

## 2024-06-18 NOTE — Assessment & Plan Note (Signed)
 Secondary to multiple myeloma/ chemotherapy  Monitor closely.

## 2024-06-18 NOTE — Assessment & Plan Note (Signed)
 Overall pain has improved.  Continue Norco 5/325mg  Q4-6hour PRN.- she uses rarely.  Recommend patient to use MS Contin  15 mg twice daily

## 2024-06-18 NOTE — Assessment & Plan Note (Signed)
 Recommend patient to increase gabapentin  to 300 mg twice daily.

## 2024-06-19 ENCOUNTER — Other Ambulatory Visit: Payer: Self-pay | Admitting: Oncology

## 2024-06-19 DIAGNOSIS — C9000 Multiple myeloma not having achieved remission: Secondary | ICD-10-CM

## 2024-06-19 LAB — MULTIPLE MYELOMA PANEL, SERUM
Albumin SerPl Elph-Mcnc: 3.6 g/dL (ref 2.9–4.4)
Albumin/Glob SerPl: 1.7 (ref 0.7–1.7)
Alpha 1: 0.3 g/dL (ref 0.0–0.4)
Alpha2 Glob SerPl Elph-Mcnc: 0.7 g/dL (ref 0.4–1.0)
B-Globulin SerPl Elph-Mcnc: 0.9 g/dL (ref 0.7–1.3)
Gamma Glob SerPl Elph-Mcnc: 0.3 g/dL — ABNORMAL LOW (ref 0.4–1.8)
Globulin, Total: 2.2 g/dL (ref 2.2–3.9)
IgA: 20 mg/dL — ABNORMAL LOW (ref 87–352)
IgG (Immunoglobin G), Serum: 366 mg/dL — ABNORMAL LOW (ref 586–1602)
IgM (Immunoglobulin M), Srm: 7 mg/dL — ABNORMAL LOW (ref 26–217)
Total Protein ELP: 5.8 g/dL — ABNORMAL LOW (ref 6.0–8.5)

## 2024-06-19 LAB — KAPPA/LAMBDA LIGHT CHAINS
Kappa free light chain: 8.9 mg/L (ref 3.3–19.4)
Kappa, lambda light chain ratio: 2.12 — ABNORMAL HIGH (ref 0.26–1.65)
Lambda free light chains: 4.2 mg/L — ABNORMAL LOW (ref 5.7–26.3)

## 2024-06-22 ENCOUNTER — Inpatient Hospital Stay

## 2024-06-25 ENCOUNTER — Inpatient Hospital Stay: Attending: Oncology

## 2024-06-25 ENCOUNTER — Inpatient Hospital Stay

## 2024-06-25 VITALS — BP 98/57 | HR 67 | Temp 98.4°F | Resp 18 | Wt 158.8 lb

## 2024-06-25 DIAGNOSIS — C9000 Multiple myeloma not having achieved remission: Secondary | ICD-10-CM

## 2024-06-25 LAB — CMP (CANCER CENTER ONLY)
ALT: 14 U/L (ref 0–44)
AST: 15 U/L (ref 15–41)
Albumin: 4.3 g/dL (ref 3.5–5.0)
Alkaline Phosphatase: 69 U/L (ref 38–126)
Anion gap: 13 (ref 5–15)
BUN: 12 mg/dL (ref 8–23)
CO2: 30 mmol/L (ref 22–32)
Calcium: 9.4 mg/dL (ref 8.9–10.3)
Chloride: 97 mmol/L — ABNORMAL LOW (ref 98–111)
Creatinine: 0.64 mg/dL (ref 0.44–1.00)
GFR, Estimated: >60 mL/min
Glucose, Bld: 174 mg/dL — ABNORMAL HIGH (ref 70–99)
Potassium: 4 mmol/L (ref 3.5–5.1)
Sodium: 140 mmol/L (ref 135–145)
Total Bilirubin: 0.9 mg/dL (ref 0.0–1.2)
Total Protein: 6.1 g/dL — ABNORMAL LOW (ref 6.5–8.1)

## 2024-06-25 LAB — CBC WITH DIFFERENTIAL (CANCER CENTER ONLY)
Abs Immature Granulocytes: 0.03 10*3/uL (ref 0.00–0.07)
Basophils Absolute: 0 10*3/uL (ref 0.0–0.1)
Basophils Relative: 0 %
Eosinophils Absolute: 0.4 10*3/uL (ref 0.0–0.5)
Eosinophils Relative: 6 %
HCT: 35.7 % — ABNORMAL LOW (ref 36.0–46.0)
Hemoglobin: 12 g/dL (ref 12.0–15.0)
Immature Granulocytes: 0 %
Lymphocytes Relative: 5 %
Lymphs Abs: 0.4 10*3/uL — ABNORMAL LOW (ref 0.7–4.0)
MCH: 31.4 pg (ref 26.0–34.0)
MCHC: 33.6 g/dL (ref 30.0–36.0)
MCV: 93.5 fL (ref 80.0–100.0)
Monocytes Absolute: 0.6 10*3/uL (ref 0.1–1.0)
Monocytes Relative: 7 %
Neutro Abs: 6.5 10*3/uL (ref 1.7–7.7)
Neutrophils Relative %: 82 %
Platelet Count: 154 10*3/uL (ref 150–400)
RBC: 3.82 MIL/uL — ABNORMAL LOW (ref 3.87–5.11)
RDW: 17.2 % — ABNORMAL HIGH (ref 11.5–15.5)
WBC Count: 7.9 10*3/uL (ref 4.0–10.5)
nRBC: 0 % (ref 0.0–0.2)

## 2024-06-25 MED ORDER — BORTEZOMIB CHEMO SQ INJECTION 3.5 MG (2.5MG/ML)
1.0000 mg/m2 | Freq: Once | INTRAMUSCULAR | Status: AC
  Administered 2024-06-25: 1.75 mg via SUBCUTANEOUS
  Filled 2024-06-25: qty 0.7

## 2024-06-25 MED ORDER — PROCHLORPERAZINE MALEATE 10 MG PO TABS
10.0000 mg | ORAL_TABLET | Freq: Once | ORAL | Status: AC
  Administered 2024-06-25: 10 mg via ORAL
  Filled 2024-06-25: qty 1

## 2024-06-26 ENCOUNTER — Other Ambulatory Visit: Payer: Self-pay | Admitting: Oncology

## 2024-06-26 ENCOUNTER — Ambulatory Visit: Payer: Self-pay | Admitting: Oncology

## 2024-06-26 ENCOUNTER — Other Ambulatory Visit: Payer: Self-pay | Admitting: Family Medicine

## 2024-06-26 DIAGNOSIS — E1165 Type 2 diabetes mellitus with hyperglycemia: Secondary | ICD-10-CM

## 2024-06-26 DIAGNOSIS — R771 Abnormality of globulin: Secondary | ICD-10-CM | POA: Insufficient documentation

## 2024-06-26 NOTE — Assessment & Plan Note (Signed)
 Recommend IVIG PRN IgG level <400.  Rationale and side effects were reviewed with patient.

## 2024-06-29 ENCOUNTER — Inpatient Hospital Stay

## 2024-07-02 ENCOUNTER — Inpatient Hospital Stay

## 2024-07-09 ENCOUNTER — Inpatient Hospital Stay

## 2024-07-09 ENCOUNTER — Inpatient Hospital Stay: Admitting: Oncology

## 2024-07-16 ENCOUNTER — Inpatient Hospital Stay

## 2024-07-23 ENCOUNTER — Inpatient Hospital Stay
